# Patient Record
Sex: Male | Born: 1990
Health system: Southern US, Community
[De-identification: ages and names within clinical notes are randomized; demographics above are authoritative.]

## PROBLEM LIST (undated history)

## (undated) DIAGNOSIS — F909 Attention-deficit hyperactivity disorder, unspecified type: Secondary | ICD-10-CM

## (undated) DIAGNOSIS — F988 Other specified behavioral and emotional disorders with onset usually occurring in childhood and adolescence: Secondary | ICD-10-CM

## (undated) DIAGNOSIS — R0602 Shortness of breath: Secondary | ICD-10-CM

## (undated) DIAGNOSIS — M2550 Pain in unspecified joint: Secondary | ICD-10-CM

## (undated) DIAGNOSIS — M7989 Other specified soft tissue disorders: Secondary | ICD-10-CM

## (undated) DIAGNOSIS — G4730 Sleep apnea, unspecified: Secondary | ICD-10-CM

## (undated) DIAGNOSIS — E669 Obesity, unspecified: Secondary | ICD-10-CM

## (undated) DIAGNOSIS — M549 Dorsalgia, unspecified: Secondary | ICD-10-CM

## (undated) DIAGNOSIS — J45909 Unspecified asthma, uncomplicated: Secondary | ICD-10-CM

## (undated) HISTORY — DX: Sleep apnea, unspecified: G47.30

## (undated) HISTORY — DX: Shortness of breath: R06.02

## (undated) HISTORY — PX: ADENOIDECTOMY: SUR15

## (undated) HISTORY — PX: TONSILLECTOMY: SUR1361

## (undated) HISTORY — DX: Other specified soft tissue disorders: M79.89

## (undated) HISTORY — DX: Attention-deficit hyperactivity disorder, unspecified type: F90.9

## (undated) HISTORY — DX: Pain in unspecified joint: M25.50

## (undated) HISTORY — DX: Dorsalgia, unspecified: M54.9

## (undated) HISTORY — DX: Other specified behavioral and emotional disorders with onset usually occurring in childhood and adolescence: F98.8

---
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
## 2008-10-23 ENCOUNTER — Emergency Department (HOSPITAL_COMMUNITY): Admission: EM | Admit: 2008-10-23 | Discharge: 2008-10-23 | Payer: Self-pay | Admitting: Family Medicine

## 2009-06-13 ENCOUNTER — Emergency Department (HOSPITAL_COMMUNITY): Admission: EM | Admit: 2009-06-13 | Discharge: 2009-06-14 | Payer: Self-pay | Admitting: Emergency Medicine

## 2010-01-12 ENCOUNTER — Emergency Department (HOSPITAL_COMMUNITY): Admission: EM | Admit: 2010-01-12 | Discharge: 2010-01-12 | Payer: Self-pay | Admitting: Family Medicine
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
## 2011-09-02 ENCOUNTER — Emergency Department (HOSPITAL_COMMUNITY): Payer: Medicaid Other

## 2011-09-02 ENCOUNTER — Encounter (HOSPITAL_COMMUNITY): Payer: Self-pay | Admitting: Emergency Medicine

## 2011-09-02 ENCOUNTER — Emergency Department (HOSPITAL_COMMUNITY)
Admission: EM | Admit: 2011-09-02 | Discharge: 2011-09-03 | Disposition: A | Discharge status: Home / Self Care | Payer: Medicaid Other | Attending: Emergency Medicine | Admitting: Emergency Medicine

## 2011-09-02 DIAGNOSIS — S8012XA Contusion of left lower leg, initial encounter: Secondary | ICD-10-CM

## 2011-09-02 DIAGNOSIS — S8010XA Contusion of unspecified lower leg, initial encounter: Secondary | ICD-10-CM | POA: Insufficient documentation

## 2011-09-02 DIAGNOSIS — Y998 Other external cause status: Secondary | ICD-10-CM | POA: Insufficient documentation

## 2011-09-02 DIAGNOSIS — W108XXA Fall (on) (from) other stairs and steps, initial encounter: Secondary | ICD-10-CM | POA: Insufficient documentation

## 2011-09-02 DIAGNOSIS — Y9301 Activity, walking, marching and hiking: Secondary | ICD-10-CM | POA: Insufficient documentation

## 2011-09-02 NOTE — ED Notes (Signed)
PT. TRIPPED AND FELL THIS EVENING , PRESENTS WITH LEFT LOWER LEG PAIN WITH BRUISE / SWELLING . AMBULATORY.

## 2011-09-03 MED ORDER — OXYCODONE-ACETAMINOPHEN 5-325 MG PO TABS: 2.0000 | ORAL_TABLET | ORAL | Status: AC | PRN

## 2011-09-03 MED ORDER — OXYCODONE-ACETAMINOPHEN 5-325 MG PO TABS
2.0000 | ORAL_TABLET | Freq: Once | ORAL | Status: AC
  Administered 2011-09-03: 2 via ORAL
  Filled 2011-09-03: qty 2

## 2011-09-03 NOTE — ED Notes (Signed)
Rx x 1, pt voiced understanding to f/u with ortho in 1 week if no improvement

## 2011-09-03 NOTE — Discharge Instructions (Signed)
Return to the ER for worsening condition, or new concerning symptoms.  Specifically, return for worsening pain, tightness in the leg/calf, tingling in the lower leg and foot, or change in color of the leg.    Hematoma A hematoma is a pocket of blood that collects under the skin, in an organ, in a body space, in a joint space, or in other tissue. The blood can clot to form a lump that you can see and feel. The lump is often firm, sore, and sometimes even painful and tender. Most hematomas get better in a few days to weeks. However, some hematomas may be serious and require medical care.Hematomas can range in size from very small to very large. CAUSES  A hematoma can be caused by a blunt or penetrating injury. It can also be caused by leakage from a blood vessel under the skin. Spontaneous leakage from a blood vessel is more likely to occur in elderly people, especially those taking blood thinners. Sometimes, a hematoma can develop after certain medical procedures. SYMPTOMS  Unlike a bruise, a hematoma forms a firm lump that you can feel. This lump is the collection of blood. The collection of blood can also cause your skin to turn a blue to dark blue color. If the hematoma is close to the surface of the skin, it often produces a yellowish color in the skin. DIAGNOSIS  Your caregiver can determine whether you have a hematoma based on your history and a physical exam. TREATMENT  Hematomas usually go away on their own over time. Rarely does the blood need to be drained out of the body. HOME CARE INSTRUCTIONS   Put ice on the injured area.   Put ice in a plastic bag.   Place a towel between your skin and the bag.   Leave the ice on for 15 to 20 minutes, 3 to 4 times a day for the first 1 to 2 days.   After the first 2 days, switch to using warm compresses on the hematoma.   Elevate the injured area to help decrease pain and swelling. Wrapping the area with an elastic bandage may also be helpful.  Compression helps to reduce swelling and promotes shrinking of the hematoma. Make sure the bandage is not wrapped too tight.   If your hematoma is on a lower extremity and is painful, crutches may be helpful for a couple days.   Only take over-the-counter or prescription medicines for pain, discomfort, or fever as directed by your caregiver. Most patients can take acetaminophen or ibuprofen for the pain.  SEEK IMMEDIATE MEDICAL CARE IF:   You have increasing pain, or your pain is not controlled with medicine.   You have a fever.   You have worsening swelling or discoloration.   Your skin over the hematoma breaks or starts bleeding.  MAKE SURE YOU:   Understand these instructions.   Will watch your condition.   Will get help right away if you are not doing well or get worse.  Document Released: 10/29/2003 Document Revised: 03/05/2011 Document Reviewed: 11/17/2010 [facility] Patient Information 2012 [place], [place].

## 2011-09-03 NOTE — ED Notes (Addendum)
Pt states he went up the steps at his home, his puppy went in front of him and he tripped and hit the stairs. Pt states he took 2 aleve around 7:30pm w/o relieve.

## 2011-09-03 NOTE — ED Notes (Signed)
First contact with pt. Ice pack in place. Report received.

## 2011-09-03 NOTE — ED Provider Notes (Signed)
History     CSN: [id number]  Arrival date & time 09/02/11  2317   First MD Initiated Contact with Patient 09/03/11 0255      Chief Complaint  Patient presents with  . Fall    (Consider location/radiation/quality/duration/timing/severity/associated sxs/prior treatment) HPI 21 year old male presents emergency department after fall. Patient reports he tripped over his dog and fell up a stair striking his left leg. Patient complaining of pain from the top of his knee down to his toes. Patient noted to have area of bruising just inferior to his patella on the medial aspect of his anterior shin. Patient has had pain with ambulation is only able to bear weight slightly.  History reviewed. No pertinent past medical history.  History reviewed. No pertinent past surgical history.  No family history on file.  History  Substance Use Topics  . Smoking status: Not on file  . Smokeless tobacco: Not on file  . Alcohol Use: Not on file      Review of Systems  All other systems reviewed and are negative.  other than listed in HPI  Allergies  Review of patient's allergies indicates no known allergies.  Home Medications   Current Outpatient Rx  Name Route Sig Dispense Refill  . IBUPROFEN 200 MG PO TABS Oral Take by mouth every 6 (six) hours as needed. Leg pain    . OXYCODONE-ACETAMINOPHEN 5-325 MG PO TABS Oral Take 2 tablets by mouth every 4 (four) hours as needed for pain. 20 tablet 0    BP 120/70  Pulse 65  Temp(Src) 97.5 F (36.4 C) (Oral)  Resp 16  SpO2 98%  Physical Exam  Nursing note and vitals reviewed. Constitutional: He is oriented to person, place, and time. He appears distressed.       Obese  HENT:  Head: Normocephalic.  Eyes: Conjunctivae and EOM are normal. Pupils are equal, round, and reactive to light.  Neck: Normal range of motion. No JVD present. No tracheal deviation present.  Cardiovascular: Normal rate, regular rhythm, normal heart sounds and intact  distal pulses.  Exam reveals no gallop and no friction rub.   No murmur heard. Pulmonary/Chest: Effort normal and breath sounds normal. No stridor. No respiratory distress. He has no wheezes. He has no rales. He exhibits no tenderness.  Musculoskeletal: He exhibits tenderness. He exhibits no edema.       Ecchymosis noted to left leg just inferior to medial pole of patella. No joint effusion noted no joint line tenderness pain with range of motion but no crepitus. Compartments are soft. Neurovascularly intact. Pain with range of motion of the ankle but no effusion crepitus or other findings on physical exam  Lymphadenopathy:    He has no cervical adenopathy.  Neurological: He is alert and oriented to person, place, and time. He exhibits normal muscle tone. Coordination normal.  Skin: Skin is dry. No rash noted. No pallor.  Psychiatric: He has a normal mood and affect. His behavior is normal. Judgment and thought content normal.    ED Course  Procedures (including critical care time)  Labs Reviewed - No data to display Dg Knee Complete 4 Views Left  09/03/2011  *RADIOLOGY REPORT*  Clinical Data: Left knee pain.  LEFT KNEE - COMPLETE 4+ VIEW  Comparison: None  Findings: The joint spaces are maintained.  No acute fracture or osteochondral lesion.  No joint effusion.  IMPRESSION: No acute bony findings.  Original Report Authenticated By: P. Loralie Champagne, M.D.     1. Contusion  of left lower leg       MDM  21 year old male with fall and contusion of his left leg. No signs of compartment syndrome, x-rays negative for fracture patient able to ambulate. Will discharge home with Ace wrap for help with swelling.  Pt given precautions for return and expected outcome of his injury.        Olivia Mackie, MD 09/03/11 [phone]

## 2011-09-28 ENCOUNTER — Emergency Department (HOSPITAL_COMMUNITY): Payer: Medicaid Other

## 2011-09-28 ENCOUNTER — Emergency Department (HOSPITAL_COMMUNITY)
Admission: EM | Admit: 2011-09-28 | Discharge: 2011-09-28 | Disposition: A | Discharge status: Home / Self Care | Payer: Medicaid Other | Attending: Emergency Medicine | Admitting: Emergency Medicine

## 2011-09-28 ENCOUNTER — Encounter (HOSPITAL_COMMUNITY): Payer: Self-pay | Admitting: *Deleted

## 2011-09-28 DIAGNOSIS — S8990XA Unspecified injury of unspecified lower leg, initial encounter: Secondary | ICD-10-CM | POA: Insufficient documentation

## 2011-09-28 DIAGNOSIS — W010XXA Fall on same level from slipping, tripping and stumbling without subsequent striking against object, initial encounter: Secondary | ICD-10-CM | POA: Insufficient documentation

## 2011-09-28 DIAGNOSIS — S99919A Unspecified injury of unspecified ankle, initial encounter: Secondary | ICD-10-CM | POA: Insufficient documentation

## 2011-09-28 MED ORDER — IBUPROFEN 800 MG PO TABS: 800.0000 mg | ORAL_TABLET | Freq: Three times a day (TID) | ORAL | Status: AC

## 2011-09-28 MED ORDER — IBUPROFEN 400 MG PO TABS
800.0000 mg | ORAL_TABLET | Freq: Once | ORAL | Status: AC
  Administered 2011-09-28: 800 mg via ORAL
  Filled 2011-09-28: qty 2

## 2011-09-28 MED ORDER — OXYCODONE-ACETAMINOPHEN 5-325 MG PO TABS: 2.0000 | ORAL_TABLET | ORAL | Status: AC | PRN

## 2011-09-28 NOTE — Discharge Instructions (Signed)
Mr Junkins the x-ray of your knee did not show a fracture.  Ice and elevate x 24 hours.Use the immobilizer x several days. Use the crutches as needed for comfort.  Follow up with Dr. Eulah Pont if not better in several days.  Take ibuprofen 800mg  for pain.  Take the percocet for extreme pain but do not drive with this.

## 2011-09-28 NOTE — Progress Notes (Signed)
Orthopedic Tech Progress Note Patient Details:  Robert Stokes 1990/06/30 [id number]  Ortho Devices Type of Ortho Device: Crutches;Knee Immobilizer Ortho Device/Splint Location: left knee Ortho Device/Splint Interventions: Application   Robert Stokes 09/28/2011, 8:19 PM

## 2011-09-28 NOTE — ED Notes (Signed)
Painful lt knee for 2-3 daYS AFTER HE FELL OVER HIS DOG COMING UP STEPS AT HIS HOUSE

## 2011-09-28 NOTE — ED Provider Notes (Signed)
History     CSN: [id number]  Arrival date & time 09/28/11  1755   None     Chief Complaint  Patient presents with  . Knee Injury    (Consider location/radiation/quality/duration/timing/severity/associated sxs/prior treatment) Patient is a 21 y.o. male presenting with knee pain. The history is provided by the patient. No language interpreter was used.  Knee Pain This is a new problem. The current episode started yesterday. The problem occurs constantly. The problem has been unchanged. Pertinent negatives include no fever, nausea, numbness, vomiting or weakness. The symptoms are aggravated by walking. He has tried NSAIDs for the symptoms.  L knee pain after falling into a step after his dog tripped him.  Slight swelling noted.  Limping with ambulation.    History reviewed. No pertinent past medical history.  History reviewed. No pertinent past surgical history.  No family history on file.  History  Substance Use Topics  . Smoking status: Never Smoker   . Smokeless tobacco: Not on file  . Alcohol Use: No      Review of Systems  Constitutional: Negative.  Negative for fever.  HENT: Negative.   Eyes: Negative.   Respiratory: Negative.   Cardiovascular: Negative.   Gastrointestinal: Negative.  Negative for nausea and vomiting.  Musculoskeletal: Positive for gait problem.       L knee pain  Neurological: Negative.  Negative for weakness and numbness.  Psychiatric/Behavioral: Negative.   All other systems reviewed and are negative.    Allergies  Review of patient's allergies indicates no known allergies.  Home Medications   Current Outpatient Rx  Name Route Sig Dispense Refill  . ACETAMINOPHEN 500 MG PO TABS Oral Take 1,000 mg by mouth every 6 (six) hours as needed. For pain      BP 128/63  Pulse 80  Temp 97.9 F (36.6 C) (Oral)  Resp 20  SpO2 98%  Physical Exam  Nursing note and vitals reviewed. Constitutional: He is oriented to person, place, and time. He  appears well-developed and well-nourished.  HENT:  Head: Normocephalic.  Eyes: Conjunctivae and EOM are normal. Pupils are equal, round, and reactive to light.  Neck: Normal range of motion. Neck supple.  Cardiovascular: Normal rate.   Pulmonary/Chest: Effort normal.  Abdominal: Soft.  Musculoskeletal: Normal range of motion. He exhibits edema and tenderness.       L knee edema and tenderness  Neurological: He is alert and oriented to person, place, and time.  Skin: Skin is warm and dry.  Psychiatric: He has a normal mood and affect.    ED Course  Procedures (including critical care time)  Labs Reviewed - No data to display Dg Knee Complete 4 Views Left  09/28/2011  *RADIOLOGY REPORT*  Clinical Data: Left knee pain.  LEFT KNEE - COMPLETE 4+ VIEW  Comparison: 09/02/2011  Findings: There is no fracture, dislocation, or joint effusion.  No arthritic changes.  IMPRESSION: Normal exam.  Original Report Authenticated By: Gwynn Burly, M.D.     No diagnosis found.    MDM  L knee pain with negative films.  Immobilizer and crutches provided.  Will follow up with Dr. Eulah Pont.  Pain meds. RICE.  + CMS below injury.        Remi Haggard, NP 09/29/11 1732

## 2011-09-30 NOTE — ED Provider Notes (Signed)
Medical screening examination/treatment/procedure(s) were performed by non-physician practitioner and as supervising physician I was immediately available for consultation/collaboration.  Konner Saiz R. Deshannon Seide, MD 09/30/11 0010 

## 2012-05-04 ENCOUNTER — Emergency Department (HOSPITAL_COMMUNITY)
Admission: EM | Admit: 2012-05-04 | Discharge: 2012-05-04 | Disposition: A | Discharge status: Home / Self Care | Payer: Medicaid Other | Attending: Emergency Medicine | Admitting: Emergency Medicine

## 2012-05-04 ENCOUNTER — Encounter (HOSPITAL_COMMUNITY): Payer: Self-pay | Admitting: Emergency Medicine

## 2012-05-04 DIAGNOSIS — M25519 Pain in unspecified shoulder: Secondary | ICD-10-CM | POA: Insufficient documentation

## 2012-05-04 DIAGNOSIS — J45909 Unspecified asthma, uncomplicated: Secondary | ICD-10-CM | POA: Insufficient documentation

## 2012-05-04 DIAGNOSIS — M25511 Pain in right shoulder: Secondary | ICD-10-CM

## 2012-05-04 HISTORY — DX: Unspecified asthma, uncomplicated: J45.909

## 2012-05-04 MED ORDER — NAPROXEN 375 MG PO TABS: 375.0000 mg | ORAL_TABLET | Freq: Two times a day (BID) | ORAL | Status: DC

## 2012-05-04 MED ORDER — METHOCARBAMOL 500 MG PO TABS: 500.0000 mg | ORAL_TABLET | Freq: Two times a day (BID) | ORAL | Status: DC

## 2012-05-04 NOTE — ED Provider Notes (Signed)
Medical screening examination/treatment/procedure(s) were performed by non-physician practitioner and as supervising physician I was immediately available for consultation/collaboration.   Carleene Cooper III, MD 05/04/12 2016

## 2012-05-04 NOTE — ED Provider Notes (Signed)
History     CSN: [id number]  Arrival date & time 05/04/12  1004   First MD Initiated Contact with Patient 05/04/12 1031      Chief Complaint  Patient presents with  . Arm Pain    (Consider location/radiation/quality/duration/timing/severity/associated sxs/prior treatment) HPI  22 year old male presents complaining of right shoulder pain. Patient reports for the past 2-3 days he has developed an acute onset of pain to his right shoulder.  Pain is moderate in intensity.  Describe pain as an aching shooting sensation from his elbow to his shoulder and back. Pain worsening with movement. Her heard a "click or pop" if he moves a certain direction.  He has tried using ice without any relief. Patient denies any recent trauma however report that in 2009 he was playing football and was hit in the right shoulder with a helmet. However that pain has resolved after a week. Patient reports he has been able to perform normal daily activities including playing basketball up until 3 days ago. Patient denies fever, rash, chest pain, shortness of breath, numbness, weakness.  Past Medical History  Diagnosis Date  . Asthma     History reviewed. No pertinent past surgical history.  No family history on file.  History  Substance Use Topics  . Smoking status: Never Smoker   . Smokeless tobacco: Not on file  . Alcohol Use: No      Review of Systems  Constitutional: Negative for fever and chills.  Respiratory: Negative for chest tightness and shortness of breath.   Cardiovascular: Negative for chest pain.  Skin: Negative for rash and wound.  Neurological: Negative for numbness.    Allergies  Review of patient's allergies indicates no known allergies.  Home Medications  No current outpatient prescriptions on file.  BP 131/83  Pulse 81  Temp 98 F (36.7 C) (Oral)  Resp 18  SpO2 97%  Physical Exam  Nursing note and vitals reviewed. Constitutional: He is oriented to person, place, and  time. He appears well-developed and well-nourished.       Moderately obese  HENT:  Head: Atraumatic.  Eyes: Conjunctivae normal are normal.  Neck: Normal range of motion. Neck supple.  Musculoskeletal: He exhibits tenderness (R shoulder: tenderness noted to R trapezius muscle and around [facility] Joint on palpation with decreased active ROM.  negative Spurling maneuver. Normal Hawkins test.  No rash, no swelling.  normal grip strength, radial pulse 2+.  brisk cap refill). He exhibits no edema.       Right shoulder: He exhibits decreased range of motion, tenderness and pain. He exhibits no bony tenderness, no swelling, no effusion, no crepitus, no deformity, normal pulse and normal strength.       Right elbow: Normal.      Right wrist: Normal.       Cervical back: Normal.  Neurological: He is alert and oriented to person, place, and time.  Skin: Skin is warm. No rash noted.  Psychiatric: He has a normal mood and affect.    ED Course  Procedures (including critical care time)  Labs Reviewed - No data to display No results found.   No diagnosis found.  11:15 AM The patient was seen and evaluated by me. Patient complaining of right shoulder pain without any acute trauma. Shoulder does not appears to be infected. Doubt septic arthritis. No deformity or dislocation noted due to non-trauma will not x-ray. Patient is neurovascularly intact. Pain may suggest possible labral tear or muscle strain.  Plan to give  patient NSAIDs, muscle relaxants, and orthopedic referral. No cervical radiculopathy concerning for radicular pain at this time  11:16 AM BP 131/83  Pulse 81  Temp 98 F (36.7 C) (Oral)  Resp 18  SpO2 97%  I have reviewed nursing notes and vital signs.   I reviewed available ER/hospitalization records thought the EMR  1. R shoulder pain   MDM          Fayrene Helper, PA-C 05/04/12 1117

## 2012-05-04 NOTE — ED Notes (Signed)
Pt c/o right shoulder pain radiating down to elbow with movement. A&Ox4, ambulatory, nad.

## 2012-08-02 ENCOUNTER — Emergency Department (HOSPITAL_COMMUNITY)
Admission: EM | Admit: 2012-08-02 | Discharge: 2012-08-02 | Disposition: A | Discharge status: Home / Self Care | Payer: Medicare Other | Attending: Emergency Medicine | Admitting: Emergency Medicine

## 2012-08-02 ENCOUNTER — Emergency Department (HOSPITAL_COMMUNITY): Payer: Medicare Other

## 2012-08-02 ENCOUNTER — Encounter (HOSPITAL_COMMUNITY): Payer: Self-pay | Admitting: Emergency Medicine

## 2012-08-02 DIAGNOSIS — S93409A Sprain of unspecified ligament of unspecified ankle, initial encounter: Secondary | ICD-10-CM | POA: Diagnosis not present

## 2012-08-02 DIAGNOSIS — S99919A Unspecified injury of unspecified ankle, initial encounter: Secondary | ICD-10-CM | POA: Insufficient documentation

## 2012-08-02 DIAGNOSIS — J45909 Unspecified asthma, uncomplicated: Secondary | ICD-10-CM | POA: Insufficient documentation

## 2012-08-02 DIAGNOSIS — M79604 Pain in right leg: Secondary | ICD-10-CM

## 2012-08-02 DIAGNOSIS — Y9367 Activity, basketball: Secondary | ICD-10-CM | POA: Insufficient documentation

## 2012-08-02 DIAGNOSIS — S8990XA Unspecified injury of unspecified lower leg, initial encounter: Secondary | ICD-10-CM | POA: Diagnosis not present

## 2012-08-02 DIAGNOSIS — Y929 Unspecified place or not applicable: Secondary | ICD-10-CM | POA: Insufficient documentation

## 2012-08-02 DIAGNOSIS — W1809XA Striking against other object with subsequent fall, initial encounter: Secondary | ICD-10-CM | POA: Insufficient documentation

## 2012-08-02 MED ORDER — TRAMADOL HCL 50 MG PO TABS: 50.0000 mg | ORAL_TABLET | Freq: Four times a day (QID) | ORAL | Status: DC | PRN

## 2012-08-02 NOTE — ED Notes (Signed)
Pt c/o bilateral leg pain after hitting on grocery cart today

## 2012-08-02 NOTE — Progress Notes (Signed)
Orthopedic Tech Progress Note Patient Details:  Robert Stokes 06-Feb-1991 [id number]  Ortho Devices Type of Ortho Device: Crutches Ortho Device/Splint Interventions: Ordered;Adjustment   Jennye Moccasin 08/02/2012, 4:17 PM

## 2012-08-02 NOTE — ED Provider Notes (Signed)
History     CSN: [id number]  Arrival date & time 08/02/12  1419   First MD Initiated Contact with Patient 08/02/12 1434      Chief Complaint  Patient presents with  . Leg Pain    (Consider location/radiation/quality/duration/timing/severity/associated sxs/prior treatment) HPI Comments: Patient reports that approximately one hour prior to arrival he while playing basketball he hit his legs on the bottom area of a grocery cart.  He then fell to the ground and twisted his left ankle when he fell.  He reports that she has been having pain of the left ankle and the bilateral lower legs since that time.  He has not taken anything for pain prior to arrival.  He reports that he was able to walk after the injury, but had significant pain with ambulation.  He has also noticed some bruising and swelling of the anterior lower legs.  He denies numbness or tingling.  No laceration.  The history is provided by the patient.    Past Medical History  Diagnosis Date  . Asthma     History reviewed. No pertinent past surgical history.  History reviewed. No pertinent family history.  History  Substance Use Topics  . Smoking status: Never Smoker   . Smokeless tobacco: Not on file  . Alcohol Use: No      Review of Systems  Musculoskeletal:       Bilateral lower leg pain Left ankle pain    Allergies  Review of patient's allergies indicates no known allergies.  Home Medications  No current outpatient prescriptions on file.  BP 160/85  Pulse 81  Temp(Src) 98.3 F (36.8 C) (Oral)  Resp 18  SpO2 98%  Physical Exam  Nursing note and vitals reviewed. Constitutional: He appears well-developed and well-nourished.  HENT:  Head: Normocephalic and atraumatic.  Neck: Normal range of motion. Neck supple.  Cardiovascular: Normal rate, regular rhythm and normal heart sounds.   Pulses:      Dorsalis pedis pulses are 2+ on the right side, and 2+ on the left side.  Pulmonary/Chest: Effort  normal and breath sounds normal.  Musculoskeletal:       Right knee: He exhibits normal range of motion, no swelling, no deformity and no erythema. No tenderness found.       Left knee: He exhibits normal range of motion, no swelling, no deformity and no erythema. No tenderness found.       Left ankle: He exhibits swelling. He exhibits no ecchymosis and no deformity. Tenderness. Lateral malleolus tenderness found.  Bruising and mild swelling over the anterior lower legs bilaterally   Neurological: He is alert. No sensory deficit.  Skin: Skin is warm, dry and intact. No laceration noted.    ED Course  Procedures (including critical care time)  Labs Reviewed - No data to display Dg Tibia/fibula Left  08/02/2012  *RADIOLOGY REPORT*  Clinical Data: Bilateral lower leg pain  LEFT TIBIA AND FIBULA - 2 VIEW  Comparison: Left knee radiographs 09/28/2011.  Findings: The knee and ankle joints are located.  No acute bone or soft tissue abnormalities are present.  IMPRESSION: Negative tibia and fibula.   Original Report Authenticated By: Marin Roberts, M.D.    Dg Tibia/fibula Right  08/02/2012  *RADIOLOGY REPORT*  Clinical Data: Lower leg pain  RIGHT TIBIA AND FIBULA - 2 VIEW  Comparison: 08/09/2006, 08/14/2006  Findings: Right tibia and fibula are intact.  Normal alignment.  No fracture.  No soft tissue abnormality.  IMPRESSION: No acute  finding.   Original Report Authenticated By: Judie Petit. Shick, M.D.    Dg Ankle Complete Left  08/02/2012  *RADIOLOGY REPORT*  Clinical Data: Bilateral lower leg pain.  Trauma.  LEFT ANKLE COMPLETE - 3+ VIEW  Comparison: None.  Findings:  No fracture dislocation.  Soft tissue prominence.  Impression: No fracture.   Original Report Authenticated By: Lacy Duverney, M.D.      No diagnosis found.    MDM  Patient presenting with bilateral lower leg pain and left ankle pain after injuring himself playing basketball just prior to arrival.  Xrays negative.  Patient  neurovascularly intact.  Patient requesting crutches so that he does not have to put weight on left ankle.  Ankle ASO also given.  Patient stable for discharge.  Return precautions given.        Pascal Lux [place], PA-C 08/03/12 [id number]

## 2012-08-04 NOTE — ED Provider Notes (Signed)
Medical screening examination/treatment/procedure(s) were performed by non-physician practitioner and as supervising physician I was immediately available for consultation/collaboration.   Loren Racer, MD 08/04/12 1530

## 2013-11-03 ENCOUNTER — Emergency Department (HOSPITAL_COMMUNITY): Payer: Medicare Other

## 2013-11-03 ENCOUNTER — Emergency Department (HOSPITAL_COMMUNITY)
Admission: EM | Admit: 2013-11-03 | Discharge: 2013-11-03 | Disposition: A | Discharge status: Home / Self Care | Payer: Medicare Other | Attending: Emergency Medicine | Admitting: Emergency Medicine

## 2013-11-03 ENCOUNTER — Encounter (HOSPITAL_COMMUNITY): Payer: Self-pay | Admitting: Emergency Medicine

## 2013-11-03 DIAGNOSIS — Y9239 Other specified sports and athletic area as the place of occurrence of the external cause: Secondary | ICD-10-CM | POA: Diagnosis not present

## 2013-11-03 DIAGNOSIS — S8990XA Unspecified injury of unspecified lower leg, initial encounter: Secondary | ICD-10-CM | POA: Insufficient documentation

## 2013-11-03 DIAGNOSIS — J45909 Unspecified asthma, uncomplicated: Secondary | ICD-10-CM | POA: Diagnosis not present

## 2013-11-03 DIAGNOSIS — R296 Repeated falls: Secondary | ICD-10-CM | POA: Insufficient documentation

## 2013-11-03 DIAGNOSIS — Y9367 Activity, basketball: Secondary | ICD-10-CM | POA: Insufficient documentation

## 2013-11-03 DIAGNOSIS — Y92838 Other recreation area as the place of occurrence of the external cause: Secondary | ICD-10-CM | POA: Diagnosis not present

## 2013-11-03 DIAGNOSIS — S99919A Unspecified injury of unspecified ankle, initial encounter: Secondary | ICD-10-CM | POA: Diagnosis not present

## 2013-11-03 DIAGNOSIS — M25569 Pain in unspecified knee: Secondary | ICD-10-CM | POA: Diagnosis not present

## 2013-11-03 DIAGNOSIS — X500XXA Overexertion from strenuous movement or load, initial encounter: Secondary | ICD-10-CM | POA: Diagnosis not present

## 2013-11-03 DIAGNOSIS — S99929A Unspecified injury of unspecified foot, initial encounter: Principal | ICD-10-CM

## 2013-11-03 DIAGNOSIS — M25562 Pain in left knee: Secondary | ICD-10-CM

## 2013-11-03 DIAGNOSIS — M79609 Pain in unspecified limb: Secondary | ICD-10-CM | POA: Diagnosis not present

## 2013-11-03 DIAGNOSIS — IMO0002 Reserved for concepts with insufficient information to code with codable children: Secondary | ICD-10-CM | POA: Diagnosis not present

## 2013-11-03 MED ORDER — IBUPROFEN 800 MG PO TABS: 800.0000 mg | ORAL_TABLET | Freq: Three times a day (TID) | ORAL | Status: DC | PRN

## 2013-11-03 MED ORDER — HYDROCODONE-ACETAMINOPHEN 5-325 MG PO TABS: 1.0000 | ORAL_TABLET | ORAL | Status: DC | PRN

## 2013-11-03 MED ORDER — HYDROCODONE-ACETAMINOPHEN 5-325 MG PO TABS
2.0000 | ORAL_TABLET | Freq: Once | ORAL | Status: AC
  Administered 2013-11-03: 2 via ORAL
  Filled 2013-11-03: qty 2

## 2013-11-03 NOTE — Progress Notes (Signed)
Orthopedic Tech Progress Note Patient Details:  Royetta CarJoseph D Mcadory 08/31/1990 [id number]  Ortho Devices Type of Ortho Device: Crutches;Knee Immobilizer Ortho Device/Splint Location: lle Ortho Device/Splint Interventions: Application   Elizet Kaplan 11/03/2013, 10:08 PM

## 2013-11-03 NOTE — ED Provider Notes (Signed)
CSN: [id number]     Arrival date & time 11/03/13  [id number] History  This chart was scribed for Trixie DredgeEmily Tylena Prisk, [place] working with Richardean Canalavid H Yao, MD by Quintella ReichertMatthew Underwood, ED Scribe. This patient was seen in room TR11C/TR11C and the patient's care was started at 7:26 PM.   Chief Complaint  Patient presents with  . Leg Injury     The history is provided by the patient. No language interpreter was used.    HPI Comments: Robert Stokes is a 23 y.o. male who presents to the Emergency Department complaining of a left leg injury sustained last night.  Pt states he was playing basketball when he rolled his left ankle and fell, and another player who weighs 300 lb fell on top of him.  He landed with his left leg underneath him and "heard a snap and crunch."  He developed immediate onset of severe 10/10 pain to the left knee that radiates down left lower leg.  Pain is greatly worsened by bending the knee and bearing weight.  He states he is only able to walk by putting all of his weight on his toes.  He also reports mild pain to the left ankle "from where I rolled it."  Pt denies prior h/o knee injury or fractures. Denies weakness or numbness.  Denies hitting his head or LOC.    Past Medical History  Diagnosis Date  . Asthma     History reviewed. No pertinent past surgical history.  History reviewed. No pertinent family history.   History  Substance Use Topics  . Smoking status: Never Smoker   . Smokeless tobacco: Not on file  . Alcohol Use: No     Review of Systems  Constitutional: Negative for fever.  Musculoskeletal: Positive for arthralgias (left knee). Negative for back pain.  Skin: Negative for color change and wound.  Allergic/Immunologic: Negative for immunocompromised state.  Neurological: Negative for syncope, weakness, numbness and headaches.  Hematological: Does not bruise/bleed easily.      Allergies  Review of patient's allergies indicates no known allergies.  Home Medications    Prior to Admission medications   Medication Sig Start Date End Date Taking? Authorizing Provider  traMADol (ULTRAM) 50 MG tablet Take 1 tablet (50 mg total) by mouth every 6 (six) hours as needed for pain. 08/02/12   Heather Laisure, PA-C   BP 138/66  Pulse 86  Temp(Src) 97.6 F (36.4 C) (Oral)  Resp 18  SpO2 98%  Physical Exam  Nursing note and vitals reviewed. Constitutional: He appears well-developed and well-nourished. No distress.  HENT:  Head: Normocephalic and atraumatic.  Neck: Neck supple.  Pulmonary/Chest: Effort normal.  Musculoskeletal:       Left hip: Normal.       Left knee: He exhibits decreased range of motion. He exhibits no effusion, no ecchymosis, no deformity, no laceration, normal alignment, no LCL laxity and no MCL laxity. Tenderness found.       Left ankle: Normal.       Left foot: Normal.  Decreased flexion of right knee secondary to pain.  Tender diffusely over the lateral, medial and inferior aspect of the knee and down into the tibia.  Most significant tenderness over the tibial plateau (per pt).  No specific focal tenderness.  No laxity on exam.  Pain with stress in all directions. Wiggles all toes.  Calf nontender, no edema, compartments soft.  Thigh nontender, no edema, compartments soft.  Distal sensation and pulses intact. NO skin changes.  No ecchymosis.  No erythema, edema, or warmth.   Neurological: He is alert.  Skin: He is not diaphoretic.    ED Course  Procedures (including critical care time)  DIAGNOSTIC STUDIES: Oxygen Saturation is 98% on room air, normal by my interpretation.    COORDINATION OF CARE: 7:29 PM-Discussed treatment plan which includes x-ray with pt at bedside and pt agreed to plan.   8:47 PM-On recheck after receiving 2 tablets of 5-325mg  Vicodin pt continues to have significant pain.  He states his pain has improved from 10/10 to 8/10 currently.  Discussed treatment plan which includes CT-scan of knee due to his continued  severe pain with pt at bedside and pt agreed to plan.     Labs Review Labs Reviewed - No data to display  Imaging Review Dg Tibia/fibula Left  11/03/2013   CLINICAL DATA:  Left lower leg injury and pain.  EXAM: LEFT TIBIA AND FIBULA - 2 VIEW  COMPARISON:  08/02/2012  FINDINGS: There is no evidence of fracture or other focal bone lesions. Soft tissues are unremarkable.  IMPRESSION: Negative.   Electronically Signed   By: Laveda Abbe M.D.   On: 11/03/2013 19:59   Ct Knee Left Wo Contrast  11/03/2013   CLINICAL DATA:  Twist and fall with large person falling on top of twisted knee, diffuse pain, most severe over the tibial plateau  EXAM: CT OF THE LEFT KNEE WITHOUT CONTRAST  TECHNIQUE: Multidetector CT imaging of the LEFT knee was performed according to the standard protocol. Multiplanar CT image reconstructions were also generated.  COMPARISON:  11/03/2013 radiographs  FINDINGS: No fracture identified. Patellar and quadriceps tendons appear intact by CT. Cruciate and collateral ligaments appear intact by CT. No joint effusion. No significant soft tissue abnormality.  IMPRESSION: Negative CT of the knee. If indicated, consider MRI to evaluate with greater sensitivity for internal derangement, as well as for the possibility of osseous contusion, which would not be seen by CT.   Electronically Signed   By: Esperanza Heir M.D.   On: 11/03/2013 21:32   Dg Knee Complete 4 Views Left  11/03/2013   CLINICAL DATA:  Left knee injury and pain.  EXAM: LEFT KNEE - COMPLETE 4+ VIEW  COMPARISON:  None.  FINDINGS: There is no evidence of fracture, dislocation, or joint effusion. There is no evidence of arthropathy or other focal bone abnormality. Soft tissues are unremarkable.  IMPRESSION: Negative.   Electronically Signed   By: Laveda Abbe M.D.   On: 11/03/2013 19:56     EKG Interpretation None      MDM   Final diagnoses:  Left knee pain    Afebrile, nontoxic patient with left knee pain after fall with someone  else falling on top of his while playing basketball.  Pt with diffuse tenderness of anterior knee.  Xrays and CT negative.  Neurovascularly intact.  Joint is stable.  D/C home with knee immobilizer, crutches, norco, orthopedic follow up.  Discussed result, findings, treatment, and follow up  with patient.  Pt given return precautions.  Pt verbalizes understanding and agrees with plan.         I personally performed the services described in this documentation, which was scribed in my presence. The recorded information has been reviewed and is accurate.   Camp Swift, PA-C 11/03/13 2244

## 2013-11-03 NOTE — ED Provider Notes (Signed)
Medical screening examination/treatment/procedure(s) were performed by non-physician practitioner and as supervising physician I was immediately available for consultation/collaboration.   EKG Interpretation None        Richardean Canalavid H Cassiopeia Florentino, MD 11/03/13 [phone]

## 2013-11-03 NOTE — ED Notes (Signed)
The pt returned from c-t 

## 2013-11-03 NOTE — ED Notes (Signed)
Presents with left leg pain began last night after getting knocked down while playing basketball. C/o left knee pain that radiaties down into left ankle. Pain is described as shooting and worse with movement and weight bearing, cms intact.

## 2013-11-03 NOTE — ED Notes (Signed)
The pt has had pain in his lt tib fib upper since last pm when he was injured playing basketball.

## 2013-11-03 NOTE — ED Notes (Signed)
Pt to ct 

## 2013-11-03 NOTE — ED Notes (Signed)
The pt returned from xray painb med given

## 2013-11-03 NOTE — ED Notes (Signed)
No needs.  pts pain is better.  Waiting for  c-t pick-up

## 2013-11-03 NOTE — Discharge Instructions (Signed)
Read the information below.  Use the prescribed medication as directed.  Please discuss all new medications with your pharmacist.  Do not take additional tylenol while taking the prescribed pain medication to avoid overdose.  You may return to the Emergency Department at any time for worsening condition or any new symptoms that concern you.  If you develop uncontrolled pain, weakness or numbness of the extremity, severe discoloration of the skin, or you are unable to walk or move your leg, return to the ER for a recheck.       Knee Pain The knee is the complex joint between your thigh and your lower leg. It is made up of bones, tendons, ligaments, and cartilage. The bones that make up the knee are:  The femur in the thigh.  The tibia and fibula in the lower leg.  The patella or kneecap riding in the groove on the lower femur. CAUSES  Knee pain is a common complaint with many causes. A few of these causes are:  Injury, such as:  A ruptured ligament or tendon injury.  Torn cartilage.  Medical conditions, such as:  Gout  Arthritis  Infections  Overuse, over training, or overdoing a physical activity. Knee pain can be minor or severe. Knee pain can accompany debilitating injury. Minor knee problems often respond well to self-care measures or get well on their own. More serious injuries may need medical intervention or even surgery. SYMPTOMS The knee is complex. Symptoms of knee problems can vary widely. Some of the problems are:  Pain with movement and weight bearing.  Swelling and tenderness.  Buckling of the knee.  Inability to straighten or extend your knee.  Your knee locks and you cannot straighten it.  Warmth and redness with pain and fever.  Deformity or dislocation of the kneecap. DIAGNOSIS  Determining what is wrong may be very straight forward such as when there is an injury. It can also be challenging because of the complexity of the knee. Tests to make a  diagnosis may include:  Your caregiver taking a history and doing a physical exam.  Routine X-rays can be used to rule out other problems. X-rays will not reveal a cartilage tear. Some injuries of the knee can be diagnosed by:  Arthroscopy a surgical technique by which a small video camera is inserted through tiny incisions on the sides of the knee. This procedure is used to examine and repair internal knee joint problems. Tiny instruments can be used during arthroscopy to repair the torn knee cartilage (meniscus).  Arthrography is a radiology technique. A contrast liquid is directly injected into the knee joint. Internal structures of the knee joint then become visible on X-ray film.  An MRI scan is a non X-ray radiology procedure in which magnetic fields and a computer produce two- or three-dimensional images of the inside of the knee. Cartilage tears are often visible using an MRI scanner. MRI scans have largely replaced arthrography in diagnosing cartilage tears of the knee.  Blood work.  Examination of the fluid that helps to lubricate the knee joint (synovial fluid). This is done by taking a sample out using a needle and a syringe. TREATMENT The treatment of knee problems depends on the cause. Some of these treatments are:  Depending on the injury, proper casting, splinting, surgery, or physical therapy care will be needed.  Give yourself adequate recovery time. Do not overuse your joints. If you begin to get sore during workout routines, back off. Slow down or  do fewer repetitions.  For repetitive activities such as cycling or running, maintain your strength and nutrition.  Alternate muscle groups. For example, if you are a weight lifter, work the upper body on one day and the lower body the next.  Either tight or weak muscles do not give the proper support for your knee. Tight or weak muscles do not absorb the stress placed on the knee joint. Keep the muscles surrounding the knee  strong.  Take care of mechanical problems.  If you have flat feet, orthotics or special shoes may help. See your caregiver if you need help.  Arch supports, sometimes with wedges on the inner or outer aspect of the heel, can help. These can shift pressure away from the side of the knee most bothered by osteoarthritis.  A brace called an "unloader" brace also may be used to help ease the pressure on the most arthritic side of the knee.  If your caregiver has prescribed crutches, braces, wraps or ice, use as directed. The acronym for this is PRICE. This means protection, rest, ice, compression, and elevation.  Nonsteroidal anti-inflammatory drugs (NSAIDs), can help relieve pain. But if taken immediately after an injury, they may actually increase swelling. Take NSAIDs with food in your stomach. Stop them if you develop stomach problems. Do not take these if you have a history of ulcers, stomach pain, or bleeding from the bowel. Do not take without your caregiver's approval if you have problems with fluid retention, heart failure, or kidney problems.  For ongoing knee problems, physical therapy may be helpful.  Glucosamine and chondroitin are over-the-counter dietary supplements. Both may help relieve the pain of osteoarthritis in the knee. These medicines are different from the usual anti-inflammatory drugs. Glucosamine may decrease the rate of cartilage destruction.  Injections of a corticosteroid drug into your knee joint may help reduce the symptoms of an arthritis flare-up. They may provide pain relief that lasts a few months. You may have to wait a few months between injections. The injections do have a small increased risk of infection, water retention, and elevated blood sugar levels.  Hyaluronic acid injected into damaged joints may ease pain and provide lubrication. These injections may work by reducing inflammation. A series of shots may give relief for as long as 6 months.  Topical  painkillers. Applying certain ointments to your skin may help relieve the pain and stiffness of osteoarthritis. Ask your pharmacist for suggestions. Many over the-counter products are approved for temporary relief of arthritis pain.  In some countries, doctors often prescribe topical NSAIDs for relief of chronic conditions such as arthritis and tendinitis. A review of treatment with NSAID creams found that they worked as well as oral medications but without the serious side effects. PREVENTION  Maintain a healthy weight. Extra pounds put more strain on your joints.  Get strong, stay limber. Weak muscles are a common cause of knee injuries. Stretching is important. Include flexibility exercises in your workouts.  Be smart about exercise. If you have osteoarthritis, chronic knee pain or recurring injuries, you may need to change the way you exercise. This does not mean you have to stop being active. If your knees ache after jogging or playing basketball, consider switching to swimming, water aerobics, or other low-impact activities, at least for a few days a week. Sometimes limiting high-impact activities will provide relief.  Make sure your shoes fit well. Choose footwear that is right for your sport.  Protect your knees. Use the proper gear  for knee-sensitive activities. Use kneepads when playing volleyball or laying carpet. Buckle your seat belt every time you drive. Most shattered kneecaps occur in car accidents.  Rest when you are tired. SEEK MEDICAL CARE IF:  You have knee pain that is continual and does not seem to be getting better.  SEEK IMMEDIATE MEDICAL CARE IF:  Your knee joint feels hot to the touch and you have a high fever. MAKE SURE YOU:   Understand these instructions.  Will watch your condition.  Will get help right away if you are not doing well or get worse. Document Released: 01/11/2007 Document Revised: 06/08/2011 Document Reviewed: 01/11/2007 [facility] Patient  Information 2015 [place], [place]. This information is not intended to replace advice given to you by your health care provider. Make sure you discuss any questions you have with your health care provider.  Knee Bracing Knee braces are supports to help stabilize and protect an injured or painful knee. They come in many different styles. They should support and protect the knee without increasing the chance of other injuries to yourself or others. It is important not to have a false sense of security when using a brace. Knee braces that help you to keep using your knee:  Do not restore normal knee stability under high stress forces.  May decrease some aspects of athletic performance. Some of the different types of knee braces are:  Prophylactic knee braces are designed to prevent or reduce the severity of knee injuries during sports that make injury to the knee more likely.  Rehabilitative knee braces are designed to allow protected motion of:  Injured knees.  Knees that have been treated with or without surgery. There is no evidence that the use of a supportive knee brace protects the graft following a successful anterior cruciate ligament (ACL) reconstruction. However, braces are sometimes used to:   Protect injured ligaments.  Control knee movement during the initial healing period. They may be used as part of the treatment program for the various injured ligaments or cartilage of the knee including the:  Anterior cruciate ligament.  Medial collateral ligament.  Medial or lateral cartilage (meniscus).  Posterior cruciate ligament.  Lateral collateral ligament. Rehabilitative knee braces are most commonly used:  During crutch-assisted walking right after injury.  During crutch-assisted walking right after surgery to repair the cartilage and/or cruciate ligament injury.  For a short period of time, 2-8 weeks, after the injury or surgery. The value of a rehabilitative brace as  opposed to a cast or splint includes the:  Ability to adjust the brace for swelling.  Ability to remove the brace for examinations, icing, or showering.  Ability to allow for movement in a controlled range of motion. Functional knee braces give support to knees that have already been injured. They are designed to provide stability for the injured knee and provide protection after repair. Functional knee braces may not affect performance much. Lower extremity muscle strengthening, flexibility, and improvement in technique are more important than bracing in treating ligamentous knee injuries. Functional braces are not a substitute for rehabilitation or surgical procedures. Unloader/off-loader braces are designed to provide pain relief in arthritic knees. Patients with wear and tear arthritis from growing old or from an old cartilage injury (osteoarthritis) of the knee, and bowlegged (varus) or knock-knee (valgus) deformities, often develop increased pain in the arthritic side due to increased loading. Unloader/off-loader braces are made to reduce uneven loading in such knees. There is reduction in bowing out movement in bowlegged  knees when the correct unloader brace is used. Patients with advanced osteoarthritis or severe varus or valgus alignment problems would not likely benefit from bracing. Patellofemoral braces help the kneecap to move smoothly and well centered over the end of the femur in the knee.  Most people who wear knee braces feel that they help. However, there is a lack of scientific evidence that knee braces are helpful at the level needed for athletic participation to prevent injury. In spite of this, athletes report an increase in knee stability, pain relief, performance improvement, and confidence during athletics when using a brace.  Different knee problems require different knee braces:  Your caregiver may suggest one kind of knee brace after knee surgery.  A caregiver may choose  another kind of knee brace for support instead of surgery for some types of torn ligaments.  You may also need one for pain in the front of your knee that is not getting better with strengthening and flexibility exercises. Get your caregiver's advice if you want to try a knee brace. The caregiver will advise you on where to get them and provide a prescription when it is needed to fashion and/or fit the brace. Knee braces are the least important part of preventing knee injuries or getting better following injury. Stretching, strengthening and technique improvement are far more important in caring for and preventing knee injuries. When strengthening your knee, increase your activities a little at a time so as not to develop injuries from overuse. Work out an exercise plan with your caregiver and/or physical therapist to get the best program for you. Do not let a knee brace become a crutch. Always remember, there are no braces which support the knee as well as your original ligaments and cartilage you were born with. Conditioning, proper warm-up, and stretching remain the most important parts of keeping your knees healthy. HOW TO USE A KNEE BRACE  During sports, knee braces should be used as directed by your caregiver.  Make sure that the hinges are where the knee bends.  Straps, tapes, or hook-and-loop tapes should be fastened around your leg as instructed.  You should check the placement of the brace during activities to make sure that it has not moved. Poorly positioned braces can hurt rather than help you.  To work well, a knee brace should be worn during all activities that put you at risk of knee injury.  Warm up properly before beginning athletic activities. HOME CARE INSTRUCTIONS  Knee braces often get damaged during normal use. Replace worn-out braces for maximum benefit.  Clean regularly with soap and water.  Inspect your brace often for wear and tear.  Cover exposed metal to  protect others from injury.  Durable materials may cost more, but last longer. SEEK IMMEDIATE MEDICAL CARE IF:   Your knee seems to be getting worse rather than better.  You have increasing pain or swelling in the knee.  You have problems caused by the knee brace.  You have increased swelling or inflammation (redness or soreness) in your knee.  Your knee becomes warm and more painful and you develop an unexplained temperature over 101F (38.3C). MAKE SURE YOU:   Understand these instructions.  Will watch your condition.  Will get help right away if you are not doing well or get worse. See your caregiver, physical therapist, or orthopedic surgeon for additional information. Document Released: 06/06/2003 Document Revised: 07/31/2013 Document Reviewed: 09/12/2008 [facility] Patient Information 2015 [place]. This information is not intended  to replace advice given to you by your health care provider. Make sure you discuss any questions you have with your health care provider. ° °

## 2013-11-07 DIAGNOSIS — M25569 Pain in unspecified knee: Secondary | ICD-10-CM | POA: Diagnosis not present

## 2013-11-12 DIAGNOSIS — M1710 Unilateral primary osteoarthritis, unspecified knee: Secondary | ICD-10-CM | POA: Diagnosis not present

## 2013-11-21 DIAGNOSIS — M25579 Pain in unspecified ankle and joints of unspecified foot: Secondary | ICD-10-CM | POA: Diagnosis not present

## 2013-12-19 DIAGNOSIS — M25579 Pain in unspecified ankle and joints of unspecified foot: Secondary | ICD-10-CM | POA: Diagnosis not present

## 2014-02-13 ENCOUNTER — Emergency Department (HOSPITAL_COMMUNITY)
Admission: EM | Admit: 2014-02-13 | Discharge: 2014-02-13 | Disposition: A | Discharge status: Home / Self Care | Payer: Medicare Other | Attending: Emergency Medicine | Admitting: Emergency Medicine

## 2014-02-13 ENCOUNTER — Encounter (HOSPITAL_COMMUNITY): Payer: Self-pay | Admitting: *Deleted

## 2014-02-13 ENCOUNTER — Emergency Department (HOSPITAL_COMMUNITY): Payer: Medicare Other

## 2014-02-13 DIAGNOSIS — Y998 Other external cause status: Secondary | ICD-10-CM | POA: Diagnosis not present

## 2014-02-13 DIAGNOSIS — J45909 Unspecified asthma, uncomplicated: Secondary | ICD-10-CM | POA: Insufficient documentation

## 2014-02-13 DIAGNOSIS — Y9389 Activity, other specified: Secondary | ICD-10-CM | POA: Insufficient documentation

## 2014-02-13 DIAGNOSIS — Y9222 Religious institution as the place of occurrence of the external cause: Secondary | ICD-10-CM | POA: Diagnosis not present

## 2014-02-13 DIAGNOSIS — Z79899 Other long term (current) drug therapy: Secondary | ICD-10-CM | POA: Insufficient documentation

## 2014-02-13 DIAGNOSIS — X58XXXA Exposure to other specified factors, initial encounter: Secondary | ICD-10-CM | POA: Insufficient documentation

## 2014-02-13 DIAGNOSIS — S299XXA Unspecified injury of thorax, initial encounter: Secondary | ICD-10-CM | POA: Diagnosis not present

## 2014-02-13 DIAGNOSIS — M545 Low back pain: Secondary | ICD-10-CM | POA: Diagnosis not present

## 2014-02-13 DIAGNOSIS — S22088A Other fracture of T11-T12 vertebra, initial encounter for closed fracture: Secondary | ICD-10-CM | POA: Diagnosis not present

## 2014-02-13 DIAGNOSIS — M549 Dorsalgia, unspecified: Secondary | ICD-10-CM

## 2014-02-13 DIAGNOSIS — S22089A Unspecified fracture of T11-T12 vertebra, initial encounter for closed fracture: Secondary | ICD-10-CM | POA: Diagnosis not present

## 2014-02-13 DIAGNOSIS — S3992XA Unspecified injury of lower back, initial encounter: Secondary | ICD-10-CM | POA: Diagnosis present

## 2014-02-13 MED ORDER — OXYCODONE-ACETAMINOPHEN 10-325 MG PO TABS: 1.0000 | ORAL_TABLET | ORAL | Status: DC | PRN

## 2014-02-13 MED ORDER — METHOCARBAMOL 500 MG PO TABS: 500.0000 mg | ORAL_TABLET | Freq: Two times a day (BID) | ORAL | Status: DC

## 2014-02-13 MED ORDER — ONDANSETRON 4 MG PO TBDP
8.0000 mg | ORAL_TABLET | Freq: Once | ORAL | Status: AC
  Administered 2014-02-13: 8 mg via ORAL
  Filled 2014-02-13: qty 2

## 2014-02-13 MED ORDER — METHOCARBAMOL 500 MG PO TABS
1000.0000 mg | ORAL_TABLET | Freq: Once | ORAL | Status: AC
  Administered 2014-02-13: 1000 mg via ORAL
  Filled 2014-02-13: qty 2

## 2014-02-13 MED ORDER — IBUPROFEN 400 MG PO TABS
800.0000 mg | ORAL_TABLET | Freq: Once | ORAL | Status: AC
  Administered 2014-02-13: 800 mg via ORAL
  Filled 2014-02-13: qty 2

## 2014-02-13 MED ORDER — IBUPROFEN 800 MG PO TABS: 800.0000 mg | ORAL_TABLET | Freq: Three times a day (TID) | ORAL | Status: DC

## 2014-02-13 MED ORDER — OXYCODONE-ACETAMINOPHEN 5-325 MG PO TABS
2.0000 | ORAL_TABLET | Freq: Once | ORAL | Status: AC
  Administered 2014-02-13: 2 via ORAL
  Filled 2014-02-13: qty 2

## 2014-02-13 NOTE — ED Provider Notes (Signed)
CSN: [id number]     Arrival date & time 02/13/14  1143 History  This chart was scribed for non-physician practitioner, Dierdre ForthHannah Cynithia Hakimi, PA-C working with Benny LennertJoseph L Zammit, MD by Greggory StallionKayla Andersen, ED scribe. This patient was seen in room TR06C/TR06C and the patient's care was started at 12:00 PM.    Chief Complaint  Patient presents with  . Back Pain   The history is provided by the patient and medical records. No language interpreter was used.    HPI Comments: Robert CarJoseph D Janicki is a 23 y.o. male who presents to the Emergency Department complaining of sudden onset, worsening mid back pain that started last night after trying to catch a 300-400 pound person from falling while at church. States he heard a pop and a crunch in his back when he tried to catch the person with immediate pain afterwards.  Pain is located in the midline, low back, rated at a 10 out of 10 and it does not radiate. Movement worsens it. Pt has not taken any medications for his symptoms. Denies bowel or bladder incontinence, numbness or tingling in legs, gait disturbance. Patient denies history of back problems or back surgery.  Past Medical History  Diagnosis Date  . Asthma    History reviewed. No pertinent past surgical history. History reviewed. No pertinent family history. History  Substance Use Topics  . Smoking status: Never Smoker   . Smokeless tobacco: Not on file  . Alcohol Use: No    Review of Systems  Constitutional: Negative for fever and fatigue.  Respiratory: Negative for chest tightness and shortness of breath.   Cardiovascular: Negative for chest pain.  Gastrointestinal: Negative for nausea, vomiting, abdominal pain and diarrhea.  Genitourinary: Negative for dysuria, urgency, frequency and hematuria.       Negative for bowel or bladder incontinence.  Musculoskeletal: Positive for back pain and gait problem (2/2 pain). Negative for joint swelling, neck pain and neck stiffness.  Skin: Negative for rash.   Neurological: Negative for weakness, light-headedness, numbness and headaches.  All other systems reviewed and are negative.  Allergies  Review of patient's allergies indicates no known allergies.  Home Medications   Prior to Admission medications   Medication Sig Start Date End Date Taking? Authorizing Provider  NON FORMULARY Apply 1 application topically daily as needed ("essential oils").   Yes Historical Provider, MD  Pseudoeph-Doxylamine-DM-APAP (NYQUIL MULTI-SYMPTOM PO) Take 2 capsules by mouth daily as needed (for cold).   Yes Historical Provider, MD  ibuprofen (ADVIL,MOTRIN) 800 MG tablet Take 1 tablet (800 mg total) by mouth 3 (three) times daily. With food 02/13/14   Dahlia ClientHannah Angello Chien, PA-C  methocarbamol (ROBAXIN) 500 MG tablet Take 1 tablet (500 mg total) by mouth 2 (two) times daily. 02/13/14   Rayshard Schirtzinger, PA-C  oxyCODONE-acetaminophen (PERCOCET) 10-325 MG per tablet Take 1 tablet by mouth every 4 (four) hours as needed for pain. 02/13/14   Marshella Tello, PA-C   BP 128/69 mmHg  Pulse 73  Temp(Src) 97.8 F (36.6 C) (Oral)  Resp 18  SpO2 93%   Physical Exam  Constitutional: He appears well-developed and well-nourished. No distress.  HENT:  Head: Normocephalic and atraumatic.  Mouth/Throat: Oropharynx is clear and moist. No oropharyngeal exudate.  Eyes: Conjunctivae are normal.  Neck: Normal range of motion. Neck supple.  Full ROM without pain  Cardiovascular: Normal rate, regular rhythm and intact distal pulses.   Pulmonary/Chest: Effort normal and breath sounds normal. No respiratory distress. He has no wheezes.  Abdominal: Soft.  He exhibits no distension. There is no tenderness.  Musculoskeletal:  Decreased range of motion of the T-spine and L-spine due to pain Midline spinous process tenderness of the T-spine No tenderness to palpation of the spinous processes of the L-spine  Tenderness to palpation of the paraspinous muscles of the T-spine and  L-spine No palpable step off or deformity  Lymphadenopathy:    He has no cervical adenopathy.  Neurological: He is alert. He has normal strength and normal reflexes. No sensory deficit. GCS eye subscore is 4. GCS verbal subscore is 5. GCS motor subscore is 6.  Reflex Scores:      Bicep reflexes are 2+ on the right side and 2+ on the left side.      Brachioradialis reflexes are 2+ on the right side and 2+ on the left side.      Patellar reflexes are 2+ on the right side and 2+ on the left side.      Achilles reflexes are 2+ on the right side and 2+ on the left side. Speech is clear and goal oriented, follows commands Normal 5/5 strength in upper and lower extremities bilaterally including dorsiflexion and plantar flexion, strong and equal grip strength Sensation normal to light and sharp touch Moves extremities without ataxia, coordination intact Antalgic gait -  Walking with a stooped back, no shuffling of the feet Normal balance No Clonus  Skin: Skin is warm and dry. No rash noted. He is not diaphoretic. No erythema.  Psychiatric: He has a normal mood and affect. His behavior is normal.  Nursing note and vitals reviewed.   ED Course  Procedures (including critical care time)  DIAGNOSTIC STUDIES: Oxygen Saturation is 93% on RA, adequate by my interpretation.    COORDINATION OF CARE: 12:03 PM-Discussed treatment plan which includes lumbar and thoracic xrays, percocet, robaxin and ibuprofen with pt at bedside and pt agreed to plan.   Labs Review Labs Reviewed - No data to display  Imaging Review Dg Thoracic Spine 2 View  02/13/2014   CLINICAL DATA:  Pt was at church yesterday and a person started seizing, the pt went to go catch them and they fell backwards with the person landing on the pt. The pt describes hearing a crunch and has been in pain ever since. The pain is around his lower T-spine and upper L-spine. He states that it travels around front and into his ribs, on both  sides. No Hx of injury or surgery  EXAM: THORACIC SPINE - 2 VIEW  COMPARISON:  None.  FINDINGS: No fracture. No bone lesion. There are no degenerative changes. Slight curvature of the mid thoracic spine, convex to the right. Soft tissues are unremarkable.  IMPRESSION: 1. No fracture or acute finding. 2. Minor dextroscoliosis.  No other abnormality.   Electronically Signed   By: Amie Portlandavid  Ormond M.D.   On: 02/13/2014 13:20   Dg Lumbar Spine Complete  02/13/2014   CLINICAL DATA:  Back pain status post fall yesterday. The pain is centered in the lower thoracic and upper lumbar spine region with radiation anteriorly.  EXAM: LUMBAR SPINE - COMPLETE 4+ VIEW  COMPARISON:  Thoracic spine series of today's date  FINDINGS: The lumbar vertebral bodies are preserved in height. Mild anterior superior endplate depression at T12 is present but is of uncertain age. There is no retropulsion of bone. The disc space heights are reasonably well maintained. There is no spondylolisthesis. There is a pseudoarthrosis on the right at L5. The pedicles and transverse processes  are intact. The observed portions of the lower ribs are unremarkable.  IMPRESSION: A small anterior superior endplate depressed fracture of T12 is suspected. Its the degree of depression is approximately 2 mm. The lumbar vertebral bodies are preserved in height and exhibit no significant degenerative change.   Electronically Signed   By: David  [place]   On: 02/13/2014 13:02     EKG Interpretation None      MDM   Final diagnoses:  Back pain  T12 vertebral fracture, closed, initial encounter   Robert Stokes presents with upper low-back pain after significant loadbearing. Patient's story concerning and on clinical exam he has midline tenderness. Will image T-spine and L-spine. Will give pain control, sore relaxer and reassess.  1:33 PM Patient reports adequate pain control at this time.  Ambulating with greater ease.  X-ray with small anterior superior  endplate depressed fracture of T12 with a depression of approximately 2 mm.  Discussed these findings with the patient.  He remains without neurologic deficit on repeat exam.  We'll discharge home with adequate pain control and muscle relaxers.  She has to follow closely with spine orthopedics and return to the emergency department immediately for any development of neurologic symptoms.  I have personally reviewed patient's vitals, nursing note and any pertinent labs or imaging.  I performed an focused physical exam; undressed when appropriate .    It has been determined that no acute conditions requiring further emergency intervention are present at this time. The patient/guardian have been advised of the diagnosis and plan. I reviewed any labs and imaging including any potential incidental findings. We have discussed signs and symptoms that warrant return to the ED and they are listed in the discharge instructions.    Vital signs are stable at discharge.   BP 128/69 mmHg  Pulse 73  Temp(Src) 97.8 F (36.6 C) (Oral)  Resp 18  SpO2 93%  I personally performed the services described in this documentation, which was scribed in my presence. The recorded information has been reviewed and is accurate.  Dierdre Forth, PA-C 02/13/14 1344  Benny Lennert, MD 02/13/14 [phone]

## 2014-02-13 NOTE — ED Notes (Signed)
C/o nausea. PA informed. Orders received.

## 2014-02-13 NOTE — Discharge Instructions (Signed)
1. Medications: robaxin, ibuprofen, Percocet, usual home medications 2. Treatment: rest, drink plenty of fluids, alternate ice and heat 3. Follow Up: Please followup with your primary doctor in 3 days for discussion of your diagnoses and further evaluation after today's visit; if you do not have a primary care doctor use the resource guide provided to find one;  Return to the ER for worsening back pain, difficulty walking, loss of bowel or bladder control or other concerning symptoms    Back, Compression Fracture A compression fracture happens when a force is put upon the length of your spine. Slipping and falling on your bottom are examples of such a force. When this happens, sometimes the force is great enough to compress the building blocks (vertebral bodies) of your spine. Although this causes a lot of pain, this can usually be treated at home, unless your caregiver feels hospitalization is needed for pain control. Your backbone (spinal column) is made up of 24 main vertebral bodies in addition to the sacrum and coccyx (see illustration). These are held together by tough fibrous tissues (ligaments) and by support of your muscles. Nerve roots pass through the openings between the vertebrae. A sudden wrenching move, injury, or a fall may cause a compression fracture of one of the vertebral bodies. This may result in back pain or spread of pain into the belly (abdomen), the buttocks, and down the leg into the foot. Pain may also be created by muscle spasm alone. Large studies have been undertaken to determine the best possible course of action to help your back following injury and also to prevent future problems. The recommendations are as follows. FOLLOWING A COMPRESSION FRACTURE: Do the following only if advised by your caregiver.   If a back brace has been suggested or provided, wear it as directed.  Do not stop wearing the back brace unless instructed by your caregiver.  When allowed to return  to regular activities, avoid a sedentary lifestyle. Actively exercise. Sporadic weekend binges of tennis, racquetball, or waterskiing may actually aggravate or create problems, especially if you are not in condition for that activity.  Avoid sports requiring sudden body movements until you are in condition for them. Swimming and walking are safer activities.  Maintain good posture.  Avoid obesity.  If not already done, you should have a DEXA scan. Based on the results, be treated for osteoporosis. FOLLOWING ACUTE (SUDDEN) INJURY:  Only take over-the-counter or prescription medicines for pain, discomfort, or fever as directed by your caregiver.  Use bed rest for only the most extreme acute episode. Prolonged bed rest may aggravate your condition. Ice used for acute conditions is effective. Use a large plastic bag filled with ice. Wrap it in a towel. This also provides excellent pain relief. This may be continuous. Or use it for 30 minutes every 2 hours during acute phase, then as needed. Heat for 30 minutes prior to activities is helpful.  As soon as the acute phase (the time when your back is too painful for you to do normal activities) is over, it is important to resume normal activities and work Arboriculturisthardening programs. Back injuries can cause potentially marked changes in lifestyle. So it is important to attack these problems aggressively.  See your caregiver for continued problems. He or she can help or refer you for appropriate exercises, physical therapy, and work hardening if needed.  If you are given narcotic medications for your condition, for the next 24 hours do not:  Drive.  Operate machinery  or power tools.  Sign legal documents.  Do not drink alcohol, or take sleeping pills or other medications that may interfere with treatment. If your caregiver has given you a follow-up appointment, it is very important to keep that appointment. Not keeping the appointment could result in a  chronic or permanent injury, pain, and disability. If there is any problem keeping the appointment, you must call back to this facility for assistance.  SEEK IMMEDIATE MEDICAL CARE IF:  You develop numbness, tingling, weakness, or problems with the use of your arms or legs.  You develop severe back pain not relieved with medications.  You have changes in bowel or bladder control.  You have increasing pain in any areas of the body. Document Released: 03/16/2005 Document Revised: 07/31/2013 Document Reviewed: 10/19/2007 [facility] Patient Information 2015 [place] [employer], [place]. This information is not intended to replace advice given to you by your health care provider. Make sure you discuss any questions you have with your health care provider.
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
## 2014-02-26 DIAGNOSIS — Z6841 Body Mass Index (BMI) 40.0 and over, adult: Secondary | ICD-10-CM | POA: Diagnosis not present

## 2014-02-26 DIAGNOSIS — M4854XA Collapsed vertebra, not elsewhere classified, thoracic region, initial encounter for fracture: Secondary | ICD-10-CM | POA: Diagnosis not present

## 2014-03-06 ENCOUNTER — Other Ambulatory Visit: Payer: Self-pay | Admitting: Neurosurgery

## 2014-03-06 DIAGNOSIS — S22000A Wedge compression fracture of unspecified thoracic vertebra, initial encounter for closed fracture: Secondary | ICD-10-CM

## 2014-03-17 ENCOUNTER — Ambulatory Visit
Admission: RE | Admit: 2014-03-17 | Discharge: 2014-03-17 | Disposition: A | Discharge status: Home / Self Care | Payer: Medicare Other | Source: Ambulatory Visit | Attending: Neurosurgery | Admitting: Neurosurgery

## 2014-03-17 DIAGNOSIS — S22000A Wedge compression fracture of unspecified thoracic vertebra, initial encounter for closed fracture: Secondary | ICD-10-CM

## 2014-03-17 DIAGNOSIS — M5127 Other intervertebral disc displacement, lumbosacral region: Secondary | ICD-10-CM | POA: Diagnosis not present

## 2014-03-17 DIAGNOSIS — M5145 Schmorl's nodes, thoracolumbar region: Secondary | ICD-10-CM | POA: Diagnosis not present

## 2014-03-17 DIAGNOSIS — M5137 Other intervertebral disc degeneration, lumbosacral region: Secondary | ICD-10-CM | POA: Diagnosis not present

## 2014-04-18 DIAGNOSIS — M4854XA Collapsed vertebra, not elsewhere classified, thoracic region, initial encounter for fracture: Secondary | ICD-10-CM | POA: Diagnosis not present

## 2014-04-18 DIAGNOSIS — Z6841 Body Mass Index (BMI) 40.0 and over, adult: Secondary | ICD-10-CM | POA: Diagnosis not present

## 2014-06-28 DIAGNOSIS — M545 Low back pain: Secondary | ICD-10-CM | POA: Diagnosis not present

## 2014-06-28 DIAGNOSIS — Z131 Encounter for screening for diabetes mellitus: Secondary | ICD-10-CM | POA: Diagnosis not present

## 2014-06-28 DIAGNOSIS — Z6841 Body Mass Index (BMI) 40.0 and over, adult: Secondary | ICD-10-CM | POA: Diagnosis not present

## 2014-07-26 DIAGNOSIS — M545 Low back pain: Secondary | ICD-10-CM | POA: Diagnosis not present

## 2014-07-26 DIAGNOSIS — Z6841 Body Mass Index (BMI) 40.0 and over, adult: Secondary | ICD-10-CM | POA: Diagnosis not present

## 2014-10-10 ENCOUNTER — Encounter (HOSPITAL_COMMUNITY): Payer: Self-pay | Admitting: *Deleted

## 2014-10-10 ENCOUNTER — Emergency Department (HOSPITAL_COMMUNITY)
Admission: EM | Admit: 2014-10-10 | Discharge: 2014-10-10 | Disposition: A | Discharge status: Home / Self Care | Payer: Medicare Other | Attending: Emergency Medicine | Admitting: Emergency Medicine

## 2014-10-10 DIAGNOSIS — H578 Other specified disorders of eye and adnexa: Secondary | ICD-10-CM | POA: Insufficient documentation

## 2014-10-10 DIAGNOSIS — E669 Obesity, unspecified: Secondary | ICD-10-CM | POA: Insufficient documentation

## 2014-10-10 DIAGNOSIS — Z79899 Other long term (current) drug therapy: Secondary | ICD-10-CM | POA: Diagnosis not present

## 2014-10-10 DIAGNOSIS — H538 Other visual disturbances: Secondary | ICD-10-CM | POA: Insufficient documentation

## 2014-10-10 DIAGNOSIS — R600 Localized edema: Secondary | ICD-10-CM | POA: Insufficient documentation

## 2014-10-10 DIAGNOSIS — J45909 Unspecified asthma, uncomplicated: Secondary | ICD-10-CM | POA: Insufficient documentation

## 2014-10-10 DIAGNOSIS — Z791 Long term (current) use of non-steroidal anti-inflammatories (NSAID): Secondary | ICD-10-CM | POA: Insufficient documentation

## 2014-10-10 DIAGNOSIS — H0289 Other specified disorders of eyelid: Secondary | ICD-10-CM

## 2014-10-10 HISTORY — DX: Obesity, unspecified: E66.9

## 2014-10-10 MED ORDER — TETRACAINE HCL 0.5 % OP SOLN
2.0000 [drp] | Freq: Once | OPHTHALMIC | Status: AC
  Administered 2014-10-10: 2 [drp] via OPHTHALMIC
  Filled 2014-10-10: qty 2

## 2014-10-10 MED ORDER — ERYTHROMYCIN 5 MG/GM OP OINT
1.0000 "application " | TOPICAL_OINTMENT | Freq: Four times a day (QID) | OPHTHALMIC | Status: DC
  Administered 2014-10-10: 1 via OPHTHALMIC
  Filled 2014-10-10: qty 3.5

## 2014-10-10 MED ORDER — FLUORESCEIN SODIUM 1 MG OP STRP
1.0000 | ORAL_STRIP | Freq: Once | OPHTHALMIC | Status: DC
  Filled 2014-10-10: qty 1
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
## 2014-10-10 NOTE — ED Notes (Signed)
Pt reports having injury to left eye on Saturday night, feels like something is in his eye. Having burning pain and redness noted. Denies vision changes.

## 2014-10-10 NOTE — Discharge Instructions (Signed)
Apply erythromycin ointment every 6 hours for 7 days. Cool compresses. Avoid rubbing eye. Follow-up with primary care doctor or optometrist.

## 2014-11-28 ENCOUNTER — Emergency Department (HOSPITAL_COMMUNITY)
Admission: EM | Admit: 2014-11-28 | Discharge: 2014-11-28 | Disposition: A | Discharge status: Home / Self Care | Payer: Medicare Other | Attending: Emergency Medicine | Admitting: Emergency Medicine

## 2014-11-28 ENCOUNTER — Emergency Department (HOSPITAL_COMMUNITY): Payer: Medicare Other

## 2014-11-28 ENCOUNTER — Encounter (HOSPITAL_COMMUNITY): Payer: Self-pay

## 2014-11-28 DIAGNOSIS — Z79899 Other long term (current) drug therapy: Secondary | ICD-10-CM | POA: Diagnosis not present

## 2014-11-28 DIAGNOSIS — S6991XA Unspecified injury of right wrist, hand and finger(s), initial encounter: Secondary | ICD-10-CM | POA: Diagnosis not present

## 2014-11-28 DIAGNOSIS — Y9374 Activity, frisbee: Secondary | ICD-10-CM | POA: Insufficient documentation

## 2014-11-28 DIAGNOSIS — Y998 Other external cause status: Secondary | ICD-10-CM | POA: Insufficient documentation

## 2014-11-28 DIAGNOSIS — M79672 Pain in left foot: Secondary | ICD-10-CM | POA: Diagnosis not present

## 2014-11-28 DIAGNOSIS — M79662 Pain in left lower leg: Secondary | ICD-10-CM | POA: Diagnosis not present

## 2014-11-28 DIAGNOSIS — E669 Obesity, unspecified: Secondary | ICD-10-CM | POA: Diagnosis not present

## 2014-11-28 DIAGNOSIS — S63206A Unspecified subluxation of right little finger, initial encounter: Secondary | ICD-10-CM | POA: Diagnosis not present

## 2014-11-28 DIAGNOSIS — S63236A Subluxation of proximal interphalangeal joint of right little finger, initial encounter: Secondary | ICD-10-CM | POA: Insufficient documentation

## 2014-11-28 DIAGNOSIS — S8992XA Unspecified injury of left lower leg, initial encounter: Secondary | ICD-10-CM | POA: Diagnosis not present

## 2014-11-28 DIAGNOSIS — S63209A Unspecified subluxation of unspecified finger, initial encounter: Secondary | ICD-10-CM

## 2014-11-28 DIAGNOSIS — X58XXXA Exposure to other specified factors, initial encounter: Secondary | ICD-10-CM | POA: Diagnosis not present

## 2014-11-28 DIAGNOSIS — J45909 Unspecified asthma, uncomplicated: Secondary | ICD-10-CM | POA: Diagnosis not present

## 2014-11-28 DIAGNOSIS — S99922A Unspecified injury of left foot, initial encounter: Secondary | ICD-10-CM | POA: Diagnosis not present

## 2014-11-28 DIAGNOSIS — Y9289 Other specified places as the place of occurrence of the external cause: Secondary | ICD-10-CM | POA: Diagnosis not present

## 2014-11-28 DIAGNOSIS — M7989 Other specified soft tissue disorders: Secondary | ICD-10-CM | POA: Diagnosis not present

## 2014-11-28 MED ORDER — NAPROXEN 250 MG PO TABS: 250.0000 mg | ORAL_TABLET | Freq: Two times a day (BID) | ORAL | Status: DC

## 2014-11-28 MED ORDER — ACETAMINOPHEN 325 MG PO TABS
650.0000 mg | ORAL_TABLET | Freq: Once | ORAL | Status: AC
  Administered 2014-11-28: 650 mg via ORAL
  Filled 2014-11-28: qty 2

## 2014-11-28 NOTE — ED Notes (Signed)
Was playing ultimate frisby and rolled into a ditch and has pain in his left leg now. Felt a pop in his foot and has pain from his shin down. Also has pain to his right pinky finger.

## 2014-11-28 NOTE — ED Provider Notes (Signed)
CSN: [id number]     Arrival date & time 11/28/14  2105 History  This chart was scribed for [name], PA-C, working with Gerhard Munch, MD by Octavia Heir, ED Scribe. This patient was seen in room TR10C/TR10C and the patient's care was started at 10:20 PM.     Chief Complaint  Patient presents with  . Leg Pain      The history is provided by the patient. No language interpreter was used.   HPI Comments: Robert Stokes is a 24 y.o. male who presents to the Emergency Department complaining of sudden onset, gradual worsening left foot pain onset this evening. He has associated right fifth finger pain and left shin pain. Pt reports his pain feels like pins and needles. Pt was playing a game of ultimate frisbee when he rolled into a ditch and landed on his left foot stating all of his body weight was on that area. Pt states there is no pain unless he moves his foot a certain way and it begins to have sharp pain. He denies numbness, ankle pain, weakness, head injury, neck pain, back pain, loss of consciousness, and hand pain.  Past Medical History  Diagnosis Date  . Asthma   . Obesity    History reviewed. No pertinent past surgical history. History reviewed. No pertinent family history. Social History  Substance Use Topics  . Smoking status: Never Smoker   . Smokeless tobacco: None  . Alcohol Use: No    Review of Systems  Constitutional: Negative for fever.  Musculoskeletal: Positive for joint swelling and arthralgias. Negative for back pain and neck pain.  Skin: Negative for rash and wound.  Neurological: Negative for syncope, weakness, numbness and headaches.      Allergies  Review of patient's allergies indicates no known allergies.  Home Medications   Prior to Admission medications   Medication Sig Start Date End Date Taking? Authorizing Provider  ibuprofen (ADVIL,MOTRIN) 800 MG tablet Take 1 tablet (800 mg total) by mouth 3 (three) times daily. With food 02/13/14    Dahlia Client Muthersbaugh, PA-C  methocarbamol (ROBAXIN) 500 MG tablet Take 1 tablet (500 mg total) by mouth 2 (two) times daily. 02/13/14   Hannah Muthersbaugh, PA-C  naproxen (NAPROSYN) 250 MG tablet Take 1 tablet (250 mg total) by mouth 2 (two) times daily with a meal. 11/28/14   [name], PA-C  NON FORMULARY Apply 1 application topically daily as needed ("essential oils").    Historical Provider, MD  oxyCODONE-acetaminophen (PERCOCET) 10-325 MG per tablet Take 1 tablet by mouth every 4 (four) hours as needed for pain. 02/13/14   Hannah Muthersbaugh, PA-C  Pseudoeph-Doxylamine-DM-APAP (NYQUIL MULTI-SYMPTOM PO) Take 2 capsules by mouth daily as needed (for cold).    Historical Provider, MD   Triage vitals: BP 129/74 mmHg  Pulse 74  Temp(Src) 97.5 F (36.4 C) (Oral)  Resp 16  SpO2 97% Physical Exam  Constitutional: He is oriented to person, place, and time. He appears well-developed and well-nourished. No distress.  Nontoxic appearing. Obese male.  HENT:  Head: Normocephalic and atraumatic.  Right Ear: External ear normal.  Left Ear: External ear normal.  Eyes: Right eye exhibits no discharge. Left eye exhibits no discharge.  Cardiovascular: Normal rate and intact distal pulses.   Good capillary refill of bilateral distal toes.  good capillary refill to right 5th finger.  Bilateral radial, posterior tibialis and dorsalis pedis pulses are intact.    Pulmonary/Chest: Effort normal. No respiratory distress.  Musculoskeletal: He exhibits edema  and tenderness.  Patient has tenderness over his right pinky PIP joint and is unable to bend his finger.  Patient also has mild tenderness over his left anterior shin and the dorsal aspect of his left foot. There is no bony deformity noted to his bilateral lower extremities. He has good range of motion of his left ankle. No left ankle tenderness. No left foot, ankle, leg or knee edema or deformity noted.   Neurological: He is alert and oriented to  person, place, and time. Coordination normal.  Sensation intact to his bilateral lower extremities.  Skin: Skin is dry. No rash noted. He is not diaphoretic. No erythema. No pallor.  Psychiatric: He has a normal mood and affect. His behavior is normal.  Nursing note and vitals reviewed.   ED Course  Reduction of dislocation Date/Time: 11/28/2014 10:40 PM Performed by: [name] Authorized by: [name] Consent: Verbal consent obtained. Risks and benefits: risks, benefits and alternatives were discussed Consent given by: patient Patient understanding: patient states understanding of the procedure being performed Patient consent: the patient's understanding of the procedure matches consent given Procedure consent: procedure consent matches procedure scheduled Relevant documents: relevant documents present and verified Test results: test results available and properly labeled Site marked: the operative site was marked Imaging studies: imaging studies available Required items: required blood products, implants, devices, and special equipment available Patient identity confirmed: verbally with patient Time out: Immediately prior to procedure a "time out" was called to verify the correct patient, procedure, equipment, support staff and site/side marked as required. Local anesthesia used: no Patient sedated: no Patient tolerance: Patient tolerated the procedure well with no immediate complications Comments: Right 5th finger subluxation was reduced.     DIAGNOSTIC STUDIES: Oxygen Saturation is 97% on RA, normal by my interpretation.  COORDINATION OF CARE:  10:25 PM Discussed treatment plan with pt at bedside and pt agreed to plan.  Labs Review Labs Reviewed - No data to display  Imaging Review Dg Tibia/fibula Left  11/28/2014   CLINICAL DATA:  Injury playing frisbee tonight. Left lower leg pain and left metatarsal pain and swelling.  EXAM: LEFT TIBIA AND FIBULA - 2 VIEW   COMPARISON:  None.  FINDINGS: There is no evidence of fracture or other focal bone lesions. Soft tissues are unremarkable.  IMPRESSION: Negative.   Electronically Signed   By: Elberta Fortis M.D.   On: 11/28/2014 22:08   Dg Finger Little Right  11/28/2014   CLINICAL DATA:  Trauma to the right fifth digit while playing frisbee  EXAM: RIGHT LITTLE FINGER 2+V  COMPARISON:  None.  FINDINGS: There is no evidence of fracture or dislocation, although there is mild anterior subluxation of the middle phalanx with respect to the proximal phalanx of the right fifth digit. This is best seen on the lateral view. There is no evidence of arthropathy or other focal bone abnormality. Soft tissues are unremarkable.  IMPRESSION: Minimal anterior subluxation of the middle phalanx with respect to the proximal phalanx of the right fifth digit.   Electronically Signed   By: Christiana Pellant M.D.   On: 11/28/2014 22:10   Dg Foot Complete Left  11/28/2014   CLINICAL DATA:  Injury playing appears to tonight with left lower leg and foot pain.  EXAM: LEFT FOOT - COMPLETE 3+ VIEW  COMPARISON:  None.  FINDINGS: There is no evidence of fracture or dislocation. There is no evidence of arthropathy or other focal bone abnormality. Soft tissues are unremarkable.  IMPRESSION:  Negative.   Electronically Signed   By: Elberta Fortis M.D.   On: 11/28/2014 22:09   I have personally reviewed and evaluated these images as part of my medical decision-making.   EKG Interpretation None      Filed Vitals:   11/28/14 2110  BP: 129/74  Pulse: 74  Temp: 97.5 F (36.4 C)  TempSrc: Oral  Resp: 16  SpO2: 97%     MDM   Meds given in ED:  Medications  acetaminophen (TYLENOL) tablet 650 mg (650 mg Oral Given 11/28/14 2237)    Discharge Medication List as of 11/28/2014 10:51 PM    START taking these medications   Details  naproxen (NAPROSYN) 250 MG tablet Take 1 tablet (250 mg total) by mouth 2 (two) times daily with a meal., Starting  11/28/2014, Until Discontinued, Print        Final diagnoses:  Subluxation of finger, initial encounter  Left foot pain  Pain in left shin   This is a 25 year old male who presents to the emergency department complaining of left shin pain, left foot pain, and right pinky finger pain after he fell while playing ultimate Frisbee earlier today. The patient denies hitting his head or loss of consciousness. He denies neck or back pain. On exam the patient is afebrile nontoxic appearing. The patient has tenderness over his left pinky PIP joint and is unable to move his finger at his PIP joint. X-ray indicates a minimal anterior subluxation of the middle phalanx of the right fifth digit. I reduced this patient's finger subluxation without difficulty at bedside. Post reduction the patient has good range of motion of his right little finger and is neurovascularly intact. X-rays of his left tibia and fibula as well as left foot are unremarkable. His exam of his bilateral lower extremity is are unremarkable, as above. He reports his pain in his foot is worse with walking. Will place in postop shoe to provide more support and have him follow-up with primary care. I advised patient to follow-up with hand surgery for his finger subluxation for recheck. Patient's fingers were buddy taped and a finger splint. I advised the patient to follow-up with their primary care provider this week. I advised the patient to return to the emergency department with new or worsening symptoms or new concerns. The patient verbalized understanding and agreement with plan.    I personally performed the services described in this documentation, which was scribed in my presence. The recorded information has been reviewed and is accurate.    [name], PA-C 11/29/14 1610  Gerhard Munch, MD 11/29/14 [phone]

## 2014-11-28 NOTE — ED Notes (Signed)
Patient transported to X-ray 

## 2014-11-28 NOTE — Discharge Instructions (Signed)
Finger Dislocation (subluxation)  Finger dislocation is the displacement of bones in your finger at the joints. Most commonly, finger dislocation occurs at the proximal interphalangeal joint (the joint closest to your knuckle). Very strong, fibrous tissues (ligaments) and joint capsules connect the three bones of your fingers.  CAUSES Dislocation is caused by a forceful impact. This impact moves these bones off the joint and often tears your ligaments.  SYMPTOMS Symptoms of finger dislocation include:  Deformity of your finger.  Pain, with loss of movement. DIAGNOSIS  Finger dislocation is diagnosed with a physical exam. Often, X-ray exams are done to see if you have associated injuries, such as bone fractures. TREATMENT  Finger dislocations are treated by putting your bones back into position (reduction) either by manually moving the bones back into place or through surgery. Your finger is then kept in a fixed position (immobilized) with the use of a dressing or splint for a brief period. When your ligament has to be surgically repaired, it needs to be kept in a fixed position with a dressing or splint for 1 to 2 weeks. Because joint stiffness is a long-term complication of finger dislocation, hand exercises or physical therapy to increase the range of motion and to regain strength is usually started as soon as the ligament is healed. Exercises and therapy generally last no more than 3 months. HOME CARE INSTRUCTIONS The following measures can help to reduce pain and speed up the healing process:  Rest your injured joint. Do not move until instructed otherwise by your caregiver. Avoid activities similar to the one that caused your injury.  Apply ice to your injured joint for the first day or 2 after your reduction or as directed by your caregiver. Applying ice helps to reduce inflammation and pain.  Put ice in a plastic bag.  Place a towel between your skin and the bag.  Leave the ice on  for 15-20 minutes at a time, every 2 hours while you are awake.  Elevate your hand above your heart as directed by your caregiver to reduce swelling.  Take over-the-counter or prescription medicine for pain as your caregiver instructs you. SEEK IMMEDIATE MEDICAL CARE IF:  Your dressing or splint becomes damaged.  Your pain becomes worse rather than better.  You lose feeling in your finger, or it becomes cold and white. MAKE SURE YOU:  Understand these instructions.  Will watch your condition.  Will get help right away if you are not doing well or get worse. Document Released: 03/13/2000 Document Revised: 06/08/2011 Document Reviewed: 01/04/2011 [facility] Patient Information 2015 [place], [place]. This information is not intended to replace advice given to you by your health care provider. Make sure you discuss any questions you have with your health care provider. Buddy Taping You have a minor finger or toe injury. It can be managed by buddy taping. Buddy taping means the injured finger or toe is taped to a healthy uninjured adjacent finger or toe. Most minor fractures and dislocations of the smaller fingers and toes will heal in 3 to 4 weeks. Buddy taping immobilizes and protects the area of injury. Buddy taping is not recommended for initial treatment of fractures of the thumb, longer fingers, or the great toe. Buddy taping should not be used for unstable or deformed fractures, but as fracture healing progresses it may be used for protection during rehabilitation. Fractured fingers and toes should be protected by buddy taping as long as the injury is still painful or swollen.  When  an injury is buddy taped, place a small piece of gauze or cotton between the digits that are taped. This helps prevent the skin from breaking down from increased moisture. Buddy taping allows you to get your injury wet when you bathe. Change the gauze and tape more often if it gets wet, and dry the space between the  finger or toes. Use a sturdy, hard-soled shoe for better support if you have a fractured toe. In 2 to 3 weeks you can start motion exercises. This will keep the fingers or toes from becoming stiff.  SEEK IMMEDIATE MEDICAL CARE IF:   The injured area becomes cold, numb, or pale.  You have pain not controlled with medications.  You notice increasing deformity of the toe or finger. Document Released: 04/23/2004 Document Revised: 06/08/2011 Document Reviewed: 08/22/2008 [facility] Patient Information 2015 [place], [place]. This information is not intended to replace advice given to you by your health care provider. Make sure you discuss any questions you have with your health care provider. Foot Sprain The muscles and cord like structures which attach muscle to bone (tendons) that surround the feet are made up of units. A foot sprain can occur at the weakest spot in any of these units. This condition is most often caused by injury to or overuse of the foot, as from playing contact sports, or aggravating a previous injury, or from poor conditioning, or obesity. SYMPTOMS  Pain with movement of the foot.  Tenderness and swelling at the injury site.  Loss of strength is present in moderate or severe sprains. THE THREE GRADES OR SEVERITY OF FOOT SPRAIN ARE:  Mild (Grade I): Slightly pulled muscle without tearing of muscle or tendon fibers or loss of strength.  Moderate (Grade II): Tearing of fibers in a muscle, tendon, or at the attachment to bone, with small decrease in strength.  Severe (Grade III): Rupture of the muscle-tendon-bone attachment, with separation of fibers. Severe sprain requires surgical repair. Often repeating (chronic) sprains are caused by overuse. Sudden (acute) sprains are caused by direct injury or over-use. DIAGNOSIS  Diagnosis of this condition is usually by your own observation. If problems continue, a caregiver may be required for further evaluation and treatment. X-rays may  be required to make sure there are not breaks in the bones (fractures) present. Continued problems may require physical therapy for treatment. PREVENTION  Use strength and conditioning exercises appropriate for your sport.  Warm up properly prior to working out.  Use athletic shoes that are made for the sport you are participating in.  Allow adequate time for healing. Early return to activities makes repeat injury more likely, and can lead to an unstable arthritic foot that can result in prolonged disability. Mild sprains generally heal in 3 to 10 days, with moderate and severe sprains taking 2 to 10 weeks. Your caregiver can help you determine the proper time required for healing. HOME CARE INSTRUCTIONS   Apply ice to the injury for 15-20 minutes, 03-04 times per day. Put the ice in a plastic bag and place a towel between the bag of ice and your skin.  An elastic wrap (like an Ace bandage) may be used to keep swelling down.  Keep foot above the level of the heart, or at least raised on a footstool, when swelling and pain are present.  Try to avoid use other than gentle range of motion while the foot is painful. Do not resume use until instructed by your caregiver. Then begin use gradually, not  increasing use to the point of pain. If pain does develop, decrease use and continue the above measures, gradually increasing activities that do not cause discomfort, until you gradually achieve normal use.  Use crutches if and as instructed, and for the length of time instructed.  Keep injured foot and ankle wrapped between treatments.  Massage foot and ankle for comfort and to keep swelling down. Massage from the toes up towards the knee.  Only take over-the-counter or prescription medicines for pain, discomfort, or fever as directed by your caregiver. SEEK IMMEDIATE MEDICAL CARE IF:   Your pain and swelling increase, or pain is not controlled with medications.  You have loss of feeling in  your foot or your foot turns cold or blue.  You develop new, unexplained symptoms, or an increase of the symptoms that brought you to your caregiver. MAKE SURE YOU:   Understand these instructions.  Will watch your condition.  Will get help right away if you are not doing well or get worse. Document Released: 09/05/2001 Document Revised: 06/08/2011 Document Reviewed: 11/03/2007 [facility] Patient Information 2015 [place], [place]. This information is not intended to replace advice given to you by your health care provider. Make sure you discuss any questions you have with your health care provider.

## 2015-01-03 ENCOUNTER — Emergency Department (HOSPITAL_COMMUNITY): Payer: Medicare Other

## 2015-01-03 ENCOUNTER — Emergency Department (HOSPITAL_COMMUNITY)
Admission: EM | Admit: 2015-01-03 | Discharge: 2015-01-03 | Disposition: A | Discharge status: Home / Self Care | Payer: Medicare Other | Attending: Emergency Medicine | Admitting: Emergency Medicine

## 2015-01-03 ENCOUNTER — Encounter (HOSPITAL_COMMUNITY): Payer: Self-pay | Admitting: Emergency Medicine

## 2015-01-03 DIAGNOSIS — E669 Obesity, unspecified: Secondary | ICD-10-CM | POA: Diagnosis not present

## 2015-01-03 DIAGNOSIS — Z791 Long term (current) use of non-steroidal anti-inflammatories (NSAID): Secondary | ICD-10-CM | POA: Insufficient documentation

## 2015-01-03 DIAGNOSIS — Y9289 Other specified places as the place of occurrence of the external cause: Secondary | ICD-10-CM | POA: Diagnosis not present

## 2015-01-03 DIAGNOSIS — Y998 Other external cause status: Secondary | ICD-10-CM | POA: Diagnosis not present

## 2015-01-03 DIAGNOSIS — Y9367 Activity, basketball: Secondary | ICD-10-CM | POA: Diagnosis not present

## 2015-01-03 DIAGNOSIS — W500XXA Accidental hit or strike by another person, initial encounter: Secondary | ICD-10-CM | POA: Diagnosis not present

## 2015-01-03 DIAGNOSIS — M25561 Pain in right knee: Secondary | ICD-10-CM | POA: Diagnosis not present

## 2015-01-03 DIAGNOSIS — Z79899 Other long term (current) drug therapy: Secondary | ICD-10-CM | POA: Diagnosis not present

## 2015-01-03 DIAGNOSIS — J45909 Unspecified asthma, uncomplicated: Secondary | ICD-10-CM | POA: Diagnosis not present

## 2015-01-03 DIAGNOSIS — S8001XA Contusion of right knee, initial encounter: Secondary | ICD-10-CM | POA: Diagnosis not present

## 2015-01-03 DIAGNOSIS — S8991XA Unspecified injury of right lower leg, initial encounter: Secondary | ICD-10-CM | POA: Diagnosis present

## 2015-01-03 MED ORDER — IBUPROFEN 800 MG PO TABS: 800.0000 mg | ORAL_TABLET | Freq: Three times a day (TID) | ORAL | Status: DC

## 2015-01-03 MED ORDER — IBUPROFEN 800 MG PO TABS
800.0000 mg | ORAL_TABLET | Freq: Once | ORAL | Status: AC
  Administered 2015-01-03: 800 mg via ORAL
  Filled 2015-01-03: qty 1

## 2015-01-03 NOTE — ED Provider Notes (Signed)
CSN: [id number]     Arrival date & time 01/03/15  1523 History  By signing my name below, I, Elon Spanner, attest that this documentation has been prepared under the direction and in the presence of Eber Hong, MD. Electronically Signed: Elon Spanner, ED Scribe. 01/03/2015. 3:44 PM.    Chief Complaint  Patient presents with  . Knee Pain   The history is provided by the patient. No language interpreter was used.   HPI Comments: Robert Stokes is a 24 y.o. male who presents to the Emergency Department complaining of acute onset of right knee pain with a collision to another player's knee, the goal post and cement.  At the time of the injury he felt a pulling and pop.  He is able to ambulate normally.    Pain is constant, worse with ROM and associated with bruising.  Past Medical History  Diagnosis Date  . Asthma   . Obesity    History reviewed. No pertinent past surgical history. No family history on file. Social History  Substance Use Topics  . Smoking status: Never Smoker   . Smokeless tobacco: None  . Alcohol Use: No    Review of Systems  Constitutional: Negative for fever.  Musculoskeletal: Positive for joint swelling and arthralgias.      Allergies  Review of patient's allergies indicates no known allergies.  Home Medications   Prior to Admission medications   Medication Sig Start Date End Date Taking? Authorizing Provider  ibuprofen (ADVIL,MOTRIN) 800 MG tablet Take 1 tablet (800 mg total) by mouth 3 (three) times daily. 01/03/15   Eber Hong, MD  methocarbamol (ROBAXIN) 500 MG tablet Take 1 tablet (500 mg total) by mouth 2 (two) times daily. 02/13/14   Hannah Muthersbaugh, PA-C  naproxen (NAPROSYN) 250 MG tablet Take 1 tablet (250 mg total) by mouth 2 (two) times daily with a meal. 11/28/14   [name], PA-C  NON FORMULARY Apply 1 application topically daily as needed ("essential oils").    Historical Provider, MD  oxyCODONE-acetaminophen (PERCOCET) 10-325 MG  per tablet Take 1 tablet by mouth every 4 (four) hours as needed for pain. 02/13/14   Hannah Muthersbaugh, PA-C  Pseudoeph-Doxylamine-DM-APAP (NYQUIL MULTI-SYMPTOM PO) Take 2 capsules by mouth daily as needed (for cold).    Historical Provider, MD   BP 128/70 mmHg  Pulse 63  Temp(Src) 97.8 F (36.6 C) (Oral)  Resp 20  SpO2 95% Physical Exam  Constitutional: He appears well-developed and well-nourished.  HENT:  Head: Normocephalic and atraumatic.  Eyes: Conjunctivae are normal. Right eye exhibits no discharge. Left eye exhibits no discharge.  Pulmonary/Chest: Effort normal. No respiratory distress.  Musculoskeletal:  Bruising and hematoma over the patella anteriorly with decreased ROM.  Otherwise stable knee joint to stress.  preservability to SLR.   Neurological: He is alert. Coordination normal.  Normal strength sensation of right lower extremity.   Skin: Skin is warm and dry. No rash noted. He is not diaphoretic. No erythema.  Bruising over right patella.   Psychiatric: He has a normal mood and affect.  Nursing note and vitals reviewed.   ED Course  Procedures (including critical care time)  DIAGNOSTIC STUDIES: Oxygen Saturation is 95% on RA, normal by my interpretation.    COORDINATION OF CARE:  3:41 PM  Will order imaging of right knee.  If normal, will discharge home with knee immobilizer, crutches, orthopaedic referral.  Patient acknowledges and agrees with plan.    Labs Review Labs Reviewed - No data  to display  Imaging Review Dg Knee Complete 4 Views Right  01/03/2015   CLINICAL DATA:  Her RIGHT knee playing basketball Tuesday, prepatellar bruise, severe anterior pain  EXAM: RIGHT KNEE - COMPLETE 4+ VIEW  COMPARISON:  01/05/2007  FINDINGS: Bone mineralization normal.  Joint spaces preserved.  No fracture, dislocation, or bone destruction.  No joint effusion.  Minimal prepatellar soft tissue swelling.  IMPRESSION: No acute osseous abnormalities.   Electronically Signed    By: Ulyses Southward M.D.   On: 01/03/2015 16:13   I have personally reviewed and evaluated these images and lab results as part of my medical decision-making.    MDM   Final diagnoses:  Contusion of right knee, initial encounter    Tenderness and mild swelling about the right anterior knee, he has decreased range of motion secondary to pain, it is difficult to tell if there is swelling since he is very obese, the knee joint does appear to be stable, x-rays ordered to rule out fracture, knee immobilizer, crutches which the patient heart he has, Rice therapy, this was explained to the patient in full, he expresses his understanding.  I have personally viewed and interpreted the imaging and agree with radiologist interpretation.   Meds given in ED:  Medications  ibuprofen (ADVIL,MOTRIN) tablet 800 mg (not administered)    New Prescriptions   IBUPROFEN (ADVIL,MOTRIN) 800 MG TABLET    Take 1 tablet (800 mg total) by mouth 3 (three) times daily.    I, Eber Hong D, personally performed the services described in this documentation. All medical record entries made by the scribe were at my direction and in my presence.  I have reviewed the chart and discharge instructions and agree that the record reflects my personal performance and is accurate and complete. Babe Clenney D.  01/03/2015. 4:26 PM.       Eber Hong, MD 01/03/15 1626

## 2015-01-03 NOTE — ED Notes (Signed)
Made Ortho aware of knee immobilizer.

## 2015-01-03 NOTE — Discharge Instructions (Signed)

## 2015-01-03 NOTE — ED Notes (Signed)
Pt states that he was playing basketball 2 days ago and had a friend step on his ankle causing him to fall.  Pt hit his right knee on goal post and concrete.  Pt states that he heard a "popping sound, like when you stretch a rubber band and let it go". Pt states that he got up and shrugged it off. Pt c/o the pain has gotten worse over the past couple days and having trouble bearing weight on it now.

## 2015-01-27 ENCOUNTER — Emergency Department (HOSPITAL_COMMUNITY)
Admission: EM | Admit: 2015-01-27 | Discharge: 2015-01-27 | Disposition: A | Discharge status: Home / Self Care | Payer: Medicare Other | Attending: Emergency Medicine | Admitting: Emergency Medicine

## 2015-01-27 ENCOUNTER — Encounter (HOSPITAL_COMMUNITY): Payer: Self-pay | Admitting: Family Medicine

## 2015-01-27 ENCOUNTER — Emergency Department (HOSPITAL_COMMUNITY): Payer: Medicare Other

## 2015-01-27 DIAGNOSIS — J45901 Unspecified asthma with (acute) exacerbation: Secondary | ICD-10-CM | POA: Diagnosis not present

## 2015-01-27 DIAGNOSIS — R05 Cough: Secondary | ICD-10-CM | POA: Diagnosis not present

## 2015-01-27 DIAGNOSIS — R0602 Shortness of breath: Secondary | ICD-10-CM | POA: Diagnosis present

## 2015-01-27 DIAGNOSIS — J392 Other diseases of pharynx: Secondary | ICD-10-CM | POA: Diagnosis not present

## 2015-01-27 DIAGNOSIS — R509 Fever, unspecified: Secondary | ICD-10-CM | POA: Diagnosis not present

## 2015-01-27 DIAGNOSIS — R52 Pain, unspecified: Secondary | ICD-10-CM | POA: Diagnosis not present

## 2015-01-27 DIAGNOSIS — J40 Bronchitis, not specified as acute or chronic: Secondary | ICD-10-CM

## 2015-01-27 DIAGNOSIS — E669 Obesity, unspecified: Secondary | ICD-10-CM | POA: Insufficient documentation

## 2015-01-27 MED ORDER — PREDNISONE 20 MG PO TABS
60.0000 mg | ORAL_TABLET | Freq: Once | ORAL | Status: AC
  Administered 2015-01-27: 60 mg via ORAL
  Filled 2015-01-27: qty 3

## 2015-01-27 MED ORDER — BENZONATATE 100 MG PO CAPS: 100.0000 mg | ORAL_CAPSULE | Freq: Three times a day (TID) | ORAL | Status: DC

## 2015-01-27 MED ORDER — ALBUTEROL SULFATE (2.5 MG/3ML) 0.083% IN NEBU
5.0000 mg | INHALATION_SOLUTION | Freq: Once | RESPIRATORY_TRACT | Status: AC
  Administered 2015-01-27: 5 mg via RESPIRATORY_TRACT
  Filled 2015-01-27: qty 6

## 2015-01-27 MED ORDER — ALBUTEROL SULFATE HFA 108 (90 BASE) MCG/ACT IN AERS: 1.0000 | INHALATION_SPRAY | RESPIRATORY_TRACT | Status: DC | PRN

## 2015-01-27 MED ORDER — AZITHROMYCIN 250 MG PO TABS: 250.0000 mg | ORAL_TABLET | Freq: Every day | ORAL | Status: DC

## 2015-01-27 MED ORDER — PREDNISONE 20 MG PO TABS: 60.0000 mg | ORAL_TABLET | Freq: Every day | ORAL | Status: DC

## 2015-01-27 NOTE — Discharge Instructions (Signed)
Bronchitis is usually due to a virus. However, given how long you have had your symptoms, I will prescribe antibiotics (Azithromycin aka a Z-pack) for you tonight. I will also give you steroids to take for the next 3 days. Use the albuterol inhaler only as needed for shortness of breath. As we discussed, return to the ER for high fever, chest pain, new or worsening symptoms.   Please obtain all of your results from medical records or have your doctors office obtain the results - share them with your doctor - you should be seen at your doctors office in the next 2 days. Call today to arrange your follow up. Take the medications as prescribed. Please review all of the medicines and only take them if you do not have an allergy to them. Please be aware that if you are taking birth control pills, taking other prescriptions, ESPECIALLY ANTIBIOTICS may make the birth control ineffective - if this is the case, either do not engage in sexual activity or use alternative methods of birth control such as condoms until you have finished the medicine and your family doctor says it is OK to restart them. If you are on a blood thinner such as COUMADIN, be aware that any other medicine that you take may cause the coumadin to either work too much, or not enough - you should have your coumadin level rechecked in next 7 days if this is the case.  ?  It is also a possibility that you have an allergic reaction to any of the medicines that you have been prescribed - Everybody reacts differently to medications and while MOST people have no trouble with most medicines, you may have a reaction such as nausea, vomiting, rash, swelling, shortness of breath. If this is the case, please stop taking the medicine immediately and contact your physician.  ?  You should return to the ER if you develop severe or worsening symptoms.    How to Use an Inhaler Proper inhaler technique is very important. Good technique ensures that the medicine  reaches the lungs. Poor technique results in depositing the medicine on the tongue and back of the throat rather than in the airways. If you do not use the inhaler with good technique, the medicine will not help you. STEPS TO FOLLOW IF USING AN INHALER WITHOUT AN EXTENSION TUBE 1. Remove the cap from the inhaler. 2. If you are using the inhaler for the first time, you will need to prime it. Shake the inhaler for 5 seconds and release four puffs into the air, away from your face. Ask your health care provider or pharmacist if you have questions about priming your inhaler. 3. Shake the inhaler for 5 seconds before each breath in (inhalation). 4. Position the inhaler so that the top of the canister faces up. 5. Put your index finger on the top of the medicine canister. Your thumb supports the bottom of the inhaler. 6. Open your mouth. 7. Either place the inhaler between your teeth and place your lips tightly around the mouthpiece, or hold the inhaler 1-2 inches away from your open mouth. If you are unsure of which technique to use, ask your health care provider. 8. Breathe out (exhale) normally and as completely as possible. 9. Press the canister down with your index finger to release the medicine. 10. At the same time as the canister is pressed, inhale deeply and slowly until your lungs are completely filled. This should take 4-6 seconds. Keep your tongue  down. 11. Hold the medicine in your lungs for 5-10 seconds (10 seconds is best). This helps the medicine get into the small airways of your lungs. 12. Breathe out slowly, through pursed lips. Whistling is an example of pursed lips. 13. Wait at least 15-30 seconds between puffs. Continue with the above steps until you have taken the number of puffs your health care provider has ordered. Do not use the inhaler more than your health care provider tells you. 14. Replace the cap on the inhaler. 15. Follow the directions from your health care provider or  the inhaler insert for cleaning the inhaler. STEPS TO FOLLOW IF USING AN INHALER WITH AN EXTENSION (SPACER) 1. Remove the cap from the inhaler. 2. If you are using the inhaler for the first time, you will need to prime it. Shake the inhaler for 5 seconds and release four puffs into the air, away from your face. Ask your health care provider or pharmacist if you have questions about priming your inhaler. 3. Shake the inhaler for 5 seconds before each breath in (inhalation). 4. Place the open end of the spacer onto the mouthpiece of the inhaler. 5. Position the inhaler so that the top of the canister faces up and the spacer mouthpiece faces you. 6. Put your index finger on the top of the medicine canister. Your thumb supports the bottom of the inhaler and the spacer. 7. Breathe out (exhale) normally and as completely as possible. 8. Immediately after exhaling, place the spacer between your teeth and into your mouth. Close your lips tightly around the spacer. 9. Press the canister down with your index finger to release the medicine. 10. At the same time as the canister is pressed, inhale deeply and slowly until your lungs are completely filled. This should take 4-6 seconds. Keep your tongue down and out of the way. 11. Hold the medicine in your lungs for 5-10 seconds (10 seconds is best). This helps the medicine get into the small airways of your lungs. Exhale. 12. Repeat inhaling deeply through the spacer mouthpiece. Again hold that breath for up to 10 seconds (10 seconds is best). Exhale slowly. If it is difficult to take this second deep breath through the spacer, breathe normally several times through the spacer. Remove the spacer from your mouth. 13. Wait at least 15-30 seconds between puffs. Continue with the above steps until you have taken the number of puffs your health care provider has ordered. Do not use the inhaler more than your health care provider tells you. 14. Remove the spacer from the  inhaler, and place the cap on the inhaler. 15. Follow the directions from your health care provider or the inhaler insert for cleaning the inhaler and spacer. If you are using different kinds of inhalers, use your quick relief medicine to open the airways 10-15 minutes before using a steroid if instructed to do so by your health care provider. If you are unsure which inhalers to use and the order of using them, ask your health care provider, nurse, or respiratory therapist. If you are using a steroid inhaler, always rinse your mouth with water after your last puff, then gargle and spit out the water. Do not swallow the water. AVOID:  Inhaling before or after starting the spray of medicine. It takes practice to coordinate your breathing with triggering the spray.  Inhaling through the nose (rather than the mouth) when triggering the spray. HOW TO DETERMINE IF YOUR INHALER IS FULL OR NEARLY EMPTY  You cannot know when an inhaler is empty by shaking it. A few inhalers are now being made with dose counters. Ask your health care provider for a prescription that has a dose counter if you feel you need that extra help. If your inhaler does not have a counter, ask your health care provider to help you determine the date you need to refill your inhaler. Write the refill date on a calendar or your inhaler canister. Refill your inhaler 7-10 days before it runs out. Be sure to keep an adequate supply of medicine. This includes making sure it is not expired, and that you have a spare inhaler.  SEEK MEDICAL CARE IF:   Your symptoms are only partially relieved with your inhaler.  You are having trouble using your inhaler.  You have some increase in phlegm. SEEK IMMEDIATE MEDICAL CARE IF:   You feel little or no relief with your inhalers. You are still wheezing and are feeling shortness of breath or tightness in your chest or both.  You have dizziness, headaches, or a fast heart rate.  You have chills, fever,  or night sweats.  You have a noticeable increase in phlegm production, or there is blood in the phlegm. MAKE SURE YOU:   Understand these instructions.  Will watch your condition.  Will get help right away if you are not doing well or get worse.   This information is not intended to replace advice given to you by your health care provider. Make sure you discuss any questions you have with your health care provider.   Document Released: 03/13/2000 Document Revised: 01/04/2013 Document Reviewed: 10/13/2012 [employer] Interactive Patient Education [employer]

## 2015-01-27 NOTE — ED Provider Notes (Signed)
CSN: [id number]     Arrival date & time 01/27/15  1847 History   First MD Initiated Contact with Patient 01/27/15 2225     Chief Complaint  Patient presents with  . Generalized Body Aches  . Cough  . Shortness of Breath     HPI   Robert Stokes is an 24 y.o. male with h/o asthma who presents to the ED for evaluation of cough, body aches, and SOB. Pt states he has had a cough for the past 3 weeks. Initially associated with fever, but that has resolved. He states the cough is sometimes productive and sometimes is not. He reports he is coughing so much that he cannot sleep at night and his right ribs hurt. States that sometimes he will have coughing fits that last a few minutes and then will have shortness of breath where he feels like he is gasping for air. Denies hemoptysis. Endorses diffuse muscle aches. Denies N/V/D, abdominal pain, or chest pain. Denies problems with urination. Denies feeling weak or lightheaded. Denies sick contacts. Pt did not get a flu shot yet this year. Denies EtOH, tobacco, or other drug use.   Past Medical History  Diagnosis Date  . Asthma   . Obesity    Past Surgical History  Procedure Laterality Date  . Tonsillectomy    . Adenoidectomy     History reviewed. No pertinent family history. Social History  Substance Use Topics  . Smoking status: Never Smoker   . Smokeless tobacco: None  . Alcohol Use: No    Review of Systems  All other systems reviewed and are negative.     Allergies  Review of patient's allergies indicates no known allergies.  Home Medications   Prior to Admission medications   Medication Sig Start Date End Date Taking? Authorizing Provider  NON FORMULARY Apply 1 application topically daily as needed ("essential oils").   Yes Historical Provider, MD  Pseudoeph-Doxylamine-DM-APAP (NYQUIL MULTI-SYMPTOM PO) Take 2 capsules by mouth daily as needed (for cold).   Yes Historical Provider, MD  albuterol (PROVENTIL HFA;VENTOLIN HFA) 108 (90  BASE) MCG/ACT inhaler Inhale 1 puff into the lungs every 4 (four) hours as needed for wheezing or shortness of breath. 01/27/15   Ace GinsSerena Y Sebastiana Wuest, PA-C  azithromycin (ZITHROMAX) 250 MG tablet Take 1 tablet (250 mg total) by mouth daily. Take first 2 tablets together, then 1 every day until finished. 01/27/15   Ace GinsSerena Y Jalayia Bagheri, PA-C  benzonatate (TESSALON) 100 MG capsule Take 1 capsule (100 mg total) by mouth every 8 (eight) hours. 01/27/15   Ace GinsSerena Y Bodhi Stenglein, PA-C  ibuprofen (ADVIL,MOTRIN) 800 MG tablet Take 1 tablet (800 mg total) by mouth 3 (three) times daily. Patient not taking: Reported on 01/27/2015 01/03/15   Eber HongBrian Miller, MD  naproxen (NAPROSYN) 250 MG tablet Take 1 tablet (250 mg total) by mouth 2 (two) times daily with a meal. Patient not taking: Reported on 01/27/2015 11/28/14   Everlene FarrierWilliam Dansie, PA-C  predniSONE (DELTASONE) 20 MG tablet Take 3 tablets (60 mg total) by mouth daily. 01/27/15   Ace GinsSerena Y Atul Delucia, PA-C   BP 118/80 mmHg  Pulse 86  Temp(Src) 98.2 F (36.8 C) (Oral)  Resp 22  Ht 6' (1.829 m)  Wt 325 lb (147.419 kg)  BMI 44.07 kg/m2  SpO2 98% Physical Exam  Constitutional: He is oriented to person, place, and time.  Obese, coughing, NAD  HENT:  Right Ear: External ear normal.  Left Ear: External ear normal.  Nose: Nose normal.  Mouth/Throat: Oropharynx is clear and moist. No oropharyngeal exudate.  Mild posterior oropharyngeal erythema  Eyes: Conjunctivae and EOM are normal. Pupils are equal, round, and reactive to light.  Neck: Normal range of motion. Neck supple.  Cardiovascular: Normal rate, regular rhythm, normal heart sounds and intact distal pulses.   No murmur heard. Pulmonary/Chest: Effort normal and breath sounds normal. No stridor. No respiratory distress. He has no wheezes. He has no rales. He exhibits no tenderness.  Abdominal: Soft. Bowel sounds are normal. He exhibits no distension. There is no tenderness.  Musculoskeletal: Normal range of motion. He exhibits no edema  or tenderness.  Lymphadenopathy:    He has no cervical adenopathy.  Neurological: He is alert and oriented to person, place, and time. No cranial nerve deficit.  Skin: Skin is warm and dry. No rash noted. No erythema. No pallor.  Nursing note and vitals reviewed.   ED Course  Procedures (including critical care time) Labs Review Labs Reviewed - No data to display  Imaging Review Dg Chest 2 View  01/27/2015  CLINICAL DATA:  Flu-like symptoms for 3 weeks. Worsening chest congestion and cough, no fever. History of asthma. EXAM: CHEST  2 VIEW COMPARISON:  Chest radiograph February 13, 2014 FINDINGS: Cardiomediastinal silhouette is normal. The lungs are clear without pleural effusions or focal consolidations. Trachea projects midline and there is no pneumothorax. Soft tissue planes and included osseous structures are non-suspicious. Large body habitus. IMPRESSION: Normal chest. Electronically Signed   By: Awilda Metro M.D.   On: 01/27/2015 21:39   I have personally reviewed and evaluated these images and lab results as part of my medical decision-making.   EKG Interpretation None      MDM   Final diagnoses:  Bronchitis    CXR shows no acute processes. Pt is coughing in the room but not in respiratory distress, not working hard to breathe. I do not hear any wheezes, crackles, or rhonchi on exam. He is afebrile, normotensive, not tachycardic or tachypneic. I do not see any indication for further lab workup at this time. Given duration of symptoms will give pt a z-pack. Will also send home with prednisone, albuterol, and tessalon. Return precautions given.   Carlene Coria, PA-C 01/27/15 2340  Margarita Grizzle, MD 01/31/15 [phone]

## 2015-01-27 NOTE — ED Notes (Signed)
Pt states three weeks ago started experiencing flu-like symptoms. Took Nyquil for symptoms. Fever improved but the chest congestion has got worse. Pt has a cough with shortness of breath and chest congestion. Denies fever.

## 2015-01-31 DIAGNOSIS — Z13220 Encounter for screening for lipoid disorders: Secondary | ICD-10-CM | POA: Diagnosis not present

## 2015-01-31 DIAGNOSIS — Z131 Encounter for screening for diabetes mellitus: Secondary | ICD-10-CM | POA: Diagnosis not present

## 2015-01-31 DIAGNOSIS — M545 Low back pain: Secondary | ICD-10-CM | POA: Diagnosis not present

## 2015-01-31 DIAGNOSIS — F902 Attention-deficit hyperactivity disorder, combined type: Secondary | ICD-10-CM | POA: Diagnosis not present

## 2015-01-31 DIAGNOSIS — Z6841 Body Mass Index (BMI) 40.0 and over, adult: Secondary | ICD-10-CM | POA: Diagnosis not present

## 2015-05-02 DIAGNOSIS — M545 Low back pain: Secondary | ICD-10-CM | POA: Diagnosis not present

## 2015-05-02 DIAGNOSIS — Z6841 Body Mass Index (BMI) 40.0 and over, adult: Secondary | ICD-10-CM | POA: Diagnosis not present

## 2015-06-22 ENCOUNTER — Emergency Department (HOSPITAL_COMMUNITY): Payer: Medicare Other

## 2015-06-22 ENCOUNTER — Emergency Department (HOSPITAL_COMMUNITY)
Admission: EM | Admit: 2015-06-22 | Discharge: 2015-06-22 | Disposition: A | Discharge status: Home / Self Care | Payer: Medicare Other | Attending: Emergency Medicine | Admitting: Emergency Medicine

## 2015-06-22 ENCOUNTER — Encounter (HOSPITAL_COMMUNITY): Payer: Self-pay

## 2015-06-22 DIAGNOSIS — E669 Obesity, unspecified: Secondary | ICD-10-CM | POA: Diagnosis not present

## 2015-06-22 DIAGNOSIS — J45909 Unspecified asthma, uncomplicated: Secondary | ICD-10-CM | POA: Diagnosis not present

## 2015-06-22 DIAGNOSIS — Y9389 Activity, other specified: Secondary | ICD-10-CM | POA: Insufficient documentation

## 2015-06-22 DIAGNOSIS — Y998 Other external cause status: Secondary | ICD-10-CM | POA: Insufficient documentation

## 2015-06-22 DIAGNOSIS — W208XXA Other cause of strike by thrown, projected or falling object, initial encounter: Secondary | ICD-10-CM | POA: Diagnosis not present

## 2015-06-22 DIAGNOSIS — M79662 Pain in left lower leg: Secondary | ICD-10-CM | POA: Diagnosis not present

## 2015-06-22 DIAGNOSIS — M7989 Other specified soft tissue disorders: Secondary | ICD-10-CM | POA: Diagnosis not present

## 2015-06-22 DIAGNOSIS — S8012XA Contusion of left lower leg, initial encounter: Secondary | ICD-10-CM | POA: Diagnosis not present

## 2015-06-22 DIAGNOSIS — Y9289 Other specified places as the place of occurrence of the external cause: Secondary | ICD-10-CM | POA: Diagnosis not present

## 2015-06-22 DIAGNOSIS — Z79899 Other long term (current) drug therapy: Secondary | ICD-10-CM | POA: Diagnosis not present

## 2015-06-22 DIAGNOSIS — S8992XA Unspecified injury of left lower leg, initial encounter: Secondary | ICD-10-CM | POA: Diagnosis present

## 2015-06-22 DIAGNOSIS — Z7952 Long term (current) use of systemic steroids: Secondary | ICD-10-CM | POA: Diagnosis not present

## 2015-06-22 NOTE — Discharge Instructions (Signed)
Contusion A contusion is a deep bruise. Contusions are the result of a blunt injury to tissues and muscle fibers under the skin. The injury causes bleeding under the skin. The skin overlying the contusion may turn blue, purple, or yellow. Minor injuries will give you a painless contusion, but more severe contusions may stay painful and swollen for a few weeks.  CAUSES  This condition is usually caused by a blow, trauma, or direct force to an area of the body. SYMPTOMS  Symptoms of this condition include:  Swelling of the injured area.  Pain and tenderness in the injured area.  Discoloration. The area may have redness and then turn blue, purple, or yellow. DIAGNOSIS  This condition is diagnosed based on a physical exam and medical history. An X-ray, CT scan, or MRI may be needed to determine if there are any associated injuries, such as broken bones (fractures). TREATMENT  Specific treatment for this condition depends on what area of the body was injured. In general, the best treatment for a contusion is resting, icing, applying pressure to (compression), and elevating the injured area. This is often called the RICE strategy. Over-the-counter anti-inflammatory medicines may also be recommended for pain control.  HOME CARE INSTRUCTIONS   Rest the injured area.  If directed, apply ice to the injured area:  Put ice in a plastic bag.  Place a towel between your skin and the bag.  Leave the ice on for 20 minutes, 2-3 times per day.  If directed, apply light compression to the injured area using an elastic bandage. Make sure the bandage is not wrapped too tightly. Remove and reapply the bandage as directed by your health care provider.  If possible, raise (elevate) the injured area above the level of your heart while you are sitting or lying down.  Take over-the-counter and prescription medicines only as told by your health care provider. SEEK MEDICAL CARE IF:  Your symptoms do not  improve after several days of treatment.  Your symptoms get worse.  You have difficulty moving the injured area. SEEK IMMEDIATE MEDICAL CARE IF:   You have severe pain.  You have numbness in a hand or foot.  Your hand or foot turns pale or cold.   This information is not intended to replace advice given to you by your health care provider. Make sure you discuss any questions you have with your health care provider.  Apply ice to affected area. Take ibuprofen as needed for pain. Elevate leg as much as possible. Return to the emergency department if you experience severe worsening of her symptoms, increased swelling of the leg, redness or warmth of that area, numbness or tingling in her extremity. Otherwise, follow-up with orthopedic provider or primary care doctor.

## 2015-06-22 NOTE — ED Notes (Signed)
Pt. Was at the gym and a 50 lb weight fell into his lt. Shin area.  The shin is bruised , swollen .  His lt. Ankle is also swollen and bruised.  Pt. Reports that the pain has increased in the last few days.

## 2015-06-22 NOTE — ED Notes (Signed)
Declined W/C at D/C and was escorted to lobby by RN. 

## 2015-06-22 NOTE — ED Provider Notes (Signed)
CSN: [id number]     Arrival date & time 06/22/15  1527 History  By signing my name below, I, Emmanuella Mensah, attest that this documentation has been prepared under the direction and in the presence of AvayaSamantha Mauro Arps, PA-C. Electronically Signed: Angelene GiovanniEmmanuella Mensah, ED Scribe. 06/22/2015. 5:45 PM.    Chief Complaint  Patient presents with  . Leg Injury   The history is provided by the patient. No language interpreter was used.   HPI Comments: Robert Stokes is a 25 y.o. male with a hx of asthma who presents to the Emergency Department complaining of gradually worsening constant left leg pain s/p leg injury that occurred 3 days ago. He reports associated bruising and swelling to his left shin above his ankle, swollen left foot, intermittent numbness to his left toes, and that he is only able to ambulate on the ball of his foot due to the pain. He explains that he was weight training 3 days ago when a 50 lb weight fell on his left leg (above his left ankle) as he was standing, trying to lift the weight above his head.  No alleviating factors noted. Pt has not tried any medications PTA. No weakness or open wounds.   Past Medical History  Diagnosis Date  . Asthma   . Obesity    Past Surgical History  Procedure Laterality Date  . Tonsillectomy    . Adenoidectomy     No family history on file. Social History  Substance Use Topics  . Smoking status: Never Smoker   . Smokeless tobacco: None  . Alcohol Use: No    Review of Systems  All other systems reviewed and are negative.     Allergies  Review of patient's allergies indicates no known allergies.  Home Medications   Prior to Admission medications   Medication Sig Start Date End Date Taking? Authorizing Provider  albuterol (PROVENTIL HFA;VENTOLIN HFA) 108 (90 BASE) MCG/ACT inhaler Inhale 1 puff into the lungs every 4 (four) hours as needed for wheezing or shortness of breath. 01/27/15   Ace GinsSerena Y Sam, PA-C  azithromycin  (ZITHROMAX) 250 MG tablet Take 1 tablet (250 mg total) by mouth daily. Take first 2 tablets together, then 1 every day until finished. 01/27/15   Ace GinsSerena Y Sam, PA-C  benzonatate (TESSALON) 100 MG capsule Take 1 capsule (100 mg total) by mouth every 8 (eight) hours. 01/27/15   Ace GinsSerena Y Sam, PA-C  ibuprofen (ADVIL,MOTRIN) 800 MG tablet Take 1 tablet (800 mg total) by mouth 3 (three) times daily. Patient not taking: Reported on 01/27/2015 01/03/15   Eber HongBrian Miller, MD  naproxen (NAPROSYN) 250 MG tablet Take 1 tablet (250 mg total) by mouth 2 (two) times daily with a meal. Patient not taking: Reported on 01/27/2015 11/28/14   Everlene FarrierWilliam Dansie, PA-C  NON FORMULARY Apply 1 application topically daily as needed ("essential oils").    Historical Provider, MD  predniSONE (DELTASONE) 20 MG tablet Take 3 tablets (60 mg total) by mouth daily. 01/27/15   Ace GinsSerena Y Sam, PA-C  Pseudoeph-Doxylamine-DM-APAP (NYQUIL MULTI-SYMPTOM PO) Take 2 capsules by mouth daily as needed (for cold).    Historical Provider, MD   BP 135/87 mmHg  Pulse 79  Temp(Src) 97.5 F (36.4 C) (Oral)  Resp 20  Wt 367 lb 11.2 oz (166.788 kg)  SpO2 97% Physical Exam  Constitutional: He is oriented to person, place, and time. He appears well-developed and well-nourished. No distress.  HENT:  Head: Normocephalic and atraumatic.  Eyes: Conjunctivae are normal.  Right eye exhibits no discharge. Left eye exhibits no discharge. No scleral icterus.  Cardiovascular: Normal rate.   Pulmonary/Chest: Effort normal.  Musculoskeletal:  Significant ecchymosis and edema over anterior aspect of left lower shin and medial malleolus.  Pain felt with dorsiflexion of left foot, no obvious bony deformity Intact distal pulses.   Neurological: He is alert and oriented to person, place, and time. Coordination normal.  Skin: Skin is warm and dry. No rash noted. He is not diaphoretic. No erythema. No pallor.  Psychiatric: He has a normal mood and affect. His behavior  is normal.  Nursing note and vitals reviewed.   ED Course  Procedures (including critical care time) DIAGNOSTIC STUDIES: Oxygen Saturation is 97% on RA, normal by my interpretation.    COORDINATION OF CARE: 4:34 PM- Pt advised of plan for treatment and pt agrees. Pt will receive x-ray for further evaluation.   Imaging Review Dg Tibia/fibula Left  06/22/2015  CLINICAL DATA:  Dropped heavy object on leg.  Pain. EXAM: LEFT TIBIA AND FIBULA - 2 VIEW COMPARISON:  None. FINDINGS: There is no evidence of fracture or other focal bone lesions. Marked soft tissue swelling. IMPRESSION: Negative for fracture. Electronically Signed   By: Elsie Stain M.D.   On: 06/22/2015 17:35   Dg Ankle Complete Left  06/22/2015  CLINICAL DATA:  Dropped weight on leg, large hematoma EXAM: LEFT ANKLE COMPLETE - 3+ VIEW COMPARISON:  None. FINDINGS: No fracture or dislocation is seen. The ankle mortise is intact. The base of the fifth metatarsal is unremarkable. Moderate to severe diffuse soft tissue swelling along the ankle. IMPRESSION: No fracture or dislocation is seen. Moderate to severe diffuse soft tissue swelling. Electronically Signed   By: Charline Bills M.D.   On: 06/22/2015 17:39     Gaylyn Rong, PA-C has personally reviewed and evaluated these images as part of her medical decision-making.  MDM   Final diagnoses:  Contusion of leg, left, initial encounter  Patient X-Ray negative for obvious fracture or dislocation. Obvious bruising noted to left lower extremity. Pt advised to follow up with orthopedics if symptoms persist for possibility of missed fracture diagnosis. Patient given brace while in ED, conservative therapy recommended and discussed. Patient will be dc home & is agreeable with above plan.     I personally performed the services described in this documentation, which was scribed in my presence. The recorded information has been reviewed and is accurate.     Lester Kinsman [place]ee,  PA-C 06/22/15 1822  Benjiman Core, MD 06/22/15 [phone]

## 2017-12-06 ENCOUNTER — Ambulatory Visit (HOSPITAL_COMMUNITY)
Admission: EM | Admit: 2017-12-06 | Discharge: 2017-12-06 | Disposition: A | Discharge status: Home / Self Care | Payer: Medicare Other | Attending: Family Medicine | Admitting: Family Medicine

## 2017-12-06 ENCOUNTER — Encounter (HOSPITAL_COMMUNITY): Payer: Self-pay

## 2017-12-06 ENCOUNTER — Other Ambulatory Visit: Payer: Self-pay

## 2017-12-06 ENCOUNTER — Ambulatory Visit (INDEPENDENT_AMBULATORY_CARE_PROVIDER_SITE_OTHER): Payer: Medicare Other

## 2017-12-06 DIAGNOSIS — M25562 Pain in left knee: Secondary | ICD-10-CM

## 2017-12-06 MED ORDER — IBUPROFEN 800 MG PO TABS: 800.0000 mg | ORAL_TABLET | Freq: Three times a day (TID) | ORAL | 0 refills | Status: DC

## 2017-12-06 NOTE — ED Provider Notes (Signed)
[facility]    CSN: [id number] Arrival date & time: 12/06/17  1348     History   Chief Complaint Chief Complaint  Patient presents with  . Ankle Pain    HPI Robert Stokes is a 27 y.o. male.   HPI  Patient states he has "severe" the pain in his left knee.  Inability to straighten knee.  Inability to bear weight.  He comes into the clinic with dramatic limping putting weight only on the ball of his left foot.  States he had no injury.  He had no overuse.  No shift.  When he woke up the next morning his knee was painful.  Now he cannot flex it, extend it, put weight on it.  No swelling.  He has not taken any medicines.  He has not used any ice or heat.  He is here for evaluation.  He is requesting an x-ray.  He has told me that he is never had any problems with his left knee before.  No underlying arthritis.  No prior injury. There are some inconsistencies in his story.  He was observed walking in the parking lot without limp.  When he came into the building he had a limp.  He states he cannot put any weight on his left leg.  When he was in the x-ray department he did step full weight onto his left leg to get up onto the exam table.  He told me he had no injury.  He told the x-ray technician he had a twisting injury and felt a pop.  Not claiming a Worker's Comp. injury.  Past Medical History:  Diagnosis Date  . Asthma   . Obesity     There are no active problems to display for this patient.   Past Surgical History:  Procedure Laterality Date  . ADENOIDECTOMY    . TONSILLECTOMY         Home Medications    Prior to Admission medications   Medication Sig Start Date End Date Taking? Authorizing Provider  ibuprofen (ADVIL,MOTRIN) 800 MG tablet Take 1 tablet (800 mg total) by mouth 3 (three) times daily. 12/06/17   Eustace Moore, MD  NON FORMULARY Apply 1 application topically daily as needed ("essential oils").    [provider]    Family  History History reviewed. No pertinent family history.  Social History Social History   Tobacco Use  . Smoking status: Never Smoker  . Smokeless tobacco: Never Used  Substance Use Topics  . Alcohol use: No  . Drug use: No     Allergies   Patient has no known allergies.   Review of Systems Review of Systems  Constitutional: Negative for chills and fever.  HENT: Negative for ear pain and sore throat.   Eyes: Negative for pain and visual disturbance.  Respiratory: Negative for cough and shortness of breath.   Cardiovascular: Negative for chest pain and palpitations.  Gastrointestinal: Negative for abdominal pain and vomiting.  Genitourinary: Negative for dysuria and hematuria.  Musculoskeletal: Positive for arthralgias and gait problem. Negative for back pain.  Skin: Negative for color change and rash.  Neurological: Negative for seizures and syncope.  All other systems reviewed and are negative.    Physical Exam Triage Vital Signs ED Triage Vitals  Enc Vitals Group     BP 12/06/17 1427 (!) 145/81     Pulse Rate 12/06/17 1427 68     Resp 12/06/17 1427 20  Temp 12/06/17 1427 97.6 F (36.4 C)     Temp src --      SpO2 12/06/17 1427 98 %     Weight 12/06/17 1425 300 lb (136.1 kg)   No data found.  Updated Vital Signs BP (!) 145/81   Pulse 68   Temp 97.6 F (36.4 C)   Resp 20   Wt 136.1 kg   SpO2 98%   BMI 40.69 kg/m       Physical Exam  Constitutional: He appears well-developed and well-nourished. No distress.  Peers uncomfortable.  Is sitting with left leg partially stented, will not extend or flex to 90 degrees.  No visible bruising or swelling.  No palpable effusion.  Patient has a dramatic facial grimacing and vocalization with any palpation of the medial joint line, lateral joint line,  Anterior knee, or posterior knee.  Unable to do accurate ligament testing because of his inability/unwillingness to cooperate.  HENT:  Head: Normocephalic and  atraumatic.  Mouth/Throat: Oropharynx is clear and moist.  Eyes: Pupils are equal, round, and reactive to light. Conjunctivae are normal.  Neck: Normal range of motion.  Cardiovascular: Normal rate.  Pulmonary/Chest: Effort normal. No respiratory distress.  Abdominal: Soft. He exhibits no distension.  Musculoskeletal: Normal range of motion. He exhibits no edema.  Neurological: He is alert.  Skin: Skin is warm and dry.  Psychiatric:  Symptoms outweigh physical exam findings.  Evidence of symptom magnification     UC Treatments / Results  Labs (all labs ordered are listed, but only abnormal results are displayed) Labs Reviewed - No data to display  EKG None  Radiology Dg Knee Ap/lat W/sunrise Left  Result Date: 12/06/2017 CLINICAL DATA:  Left knee pain, no known injury, initial encounter EXAM: LEFT KNEE 3 VIEWS COMPARISON:  None. FINDINGS: No evidence of fracture, dislocation, or joint effusion. No evidence of arthropathy or other focal bone abnormality. Soft tissues are unremarkable. IMPRESSION: No acute abnormality noted. Electronically Signed   By: Alcide Clever M.D.   On: 12/06/2017 15:56    Procedures Procedures (including critical care time)  Medications Ordered in UC Medications - No data to display  Initial Impression / Assessment and Plan / UC Course  I have reviewed the triage vital signs and the nursing notes.  Pertinent labs & imaging results that were available during my care of the patient were reviewed by me and considered in my medical decision making (see chart for details).     Patient negative.  They are compared with prior x-rays of the same day (old injury documented) Final Clinical Impressions(s) / UC Diagnoses   Final diagnoses:  Acute pain of left knee     Discharge Instructions     Ice Rest Ibuprofen 3 times a day with food See orthopedist if not improving by the end of the week   ED Prescriptions    Medication Sig Dispense Auth.  Provider   ibuprofen (ADVIL,MOTRIN) 800 MG tablet Take 1 tablet (800 mg total) by mouth 3 (three) times daily. 21 tablet Eustace Moore, MD     Controlled Substance Prescriptions [place] Controlled Substance Registry consulted? Not Applicable   Eustace Moore, MD 12/06/17 [phone]

## 2017-12-06 NOTE — Discharge Instructions (Addendum)
Ice Rest Ibuprofen 3 times a day with food See orthopedist if not improving by the end of the week

## 2017-12-06 NOTE — ED Triage Notes (Signed)
Pt  has pain in his left knee. X 4 days

## 2017-12-21 DIAGNOSIS — G4700 Insomnia, unspecified: Secondary | ICD-10-CM | POA: Diagnosis not present

## 2017-12-21 DIAGNOSIS — M25569 Pain in unspecified knee: Secondary | ICD-10-CM | POA: Diagnosis not present

## 2017-12-21 DIAGNOSIS — M545 Low back pain: Secondary | ICD-10-CM | POA: Diagnosis not present

## 2017-12-31 DIAGNOSIS — M25562 Pain in left knee: Secondary | ICD-10-CM | POA: Diagnosis not present

## 2019-01-03 ENCOUNTER — Other Ambulatory Visit: Payer: Self-pay

## 2019-01-03 ENCOUNTER — Emergency Department (HOSPITAL_COMMUNITY)
Admission: EM | Admit: 2019-01-03 | Discharge: 2019-01-03 | Disposition: A | Discharge status: Home / Self Care | Payer: Medicare Other | Attending: Emergency Medicine | Admitting: Emergency Medicine

## 2019-01-03 ENCOUNTER — Encounter (HOSPITAL_COMMUNITY): Payer: Self-pay | Admitting: Emergency Medicine

## 2019-01-03 ENCOUNTER — Emergency Department (HOSPITAL_COMMUNITY): Payer: Medicare Other

## 2019-01-03 DIAGNOSIS — M7989 Other specified soft tissue disorders: Secondary | ICD-10-CM | POA: Diagnosis not present

## 2019-01-03 DIAGNOSIS — J45909 Unspecified asthma, uncomplicated: Secondary | ICD-10-CM | POA: Insufficient documentation

## 2019-01-03 DIAGNOSIS — M79671 Pain in right foot: Secondary | ICD-10-CM

## 2019-01-03 DIAGNOSIS — M79672 Pain in left foot: Secondary | ICD-10-CM

## 2019-01-03 DIAGNOSIS — M2142 Flat foot [pes planus] (acquired), left foot: Secondary | ICD-10-CM | POA: Diagnosis not present

## 2019-01-03 DIAGNOSIS — M2141 Flat foot [pes planus] (acquired), right foot: Secondary | ICD-10-CM

## 2019-01-03 MED ORDER — NAPROXEN 375 MG PO TABS: 375.0000 mg | ORAL_TABLET | Freq: Two times a day (BID) | ORAL | 0 refills | Status: DC

## 2019-01-03 NOTE — ED Triage Notes (Signed)
Pt reports he just started a job and is on his feet for many hours.  Now has pain to the bottom of his feet and lower legs, he reports swelling as well.  Was able to walk into triage.

## 2019-01-03 NOTE — ED Notes (Signed)
Patient transported to X-ray 

## 2019-01-03 NOTE — Discharge Instructions (Signed)
Limit walking. Keep feet/legs elevated to decrease swelling. NSAID as directed. Replace orthotics as soon as able. Work note for 2 days. Follow up with your care provider.

## 2019-01-03 NOTE — ED Provider Notes (Signed)
[facility] EMERGENCY DEPARTMENT Provider Note   CSN: [id number] Arrival date & time: 01/03/19  1953     History   Chief Complaint Chief Complaint  Patient presents with  . Foot Swelling    HPI [name] is a 28 y.o. male.     Patient presents with bilateral foot pain, left greater than right. Patient states he has just started a new job, and is on his feet for most of his 8 hour shift. History of pes planus, is supposed to use orthotics in his shoes. He states that his dog has destroyed his inserts, and he has not been able to see his podiatrist for replacements. He is complaining of primary discomfort along the instep of the left foot, with swelling and difficulty ambulating.   Foot Injury Location:  Foot Injury: no   Foot location:  L foot Pain details:    Quality:  Throbbing and aching   Radiates to:  Does not radiate   Severity:  Moderate   Onset quality:  Gradual   Duration:  4 days   Timing:  Constant   Progression:  Worsening Risk factors: obesity     Past Medical History:  Diagnosis Date  . Asthma   . Obesity     There are no active problems to display for this patient.   Past Surgical History:  Procedure Laterality Date  . ADENOIDECTOMY    . TONSILLECTOMY          Home Medications    Prior to Admission medications   Medication Sig Start Date End Date Taking? Authorizing Provider  ibuprofen (ADVIL,MOTRIN) 800 MG tablet Take 1 tablet (800 mg total) by mouth 3 (three) times daily. 12/06/17   Robert Moore, MD  NON FORMULARY Apply 1 application topically daily as needed ("essential oils").    [provider]    Family History No family history on file.  Social History Social History   Tobacco Use  . Smoking status: Never Smoker  . Smokeless tobacco: Never Used  Substance Use Topics  . Alcohol use: No  . Drug use: No     Allergies   Patient has no known allergies.   Review of Systems Review of  Systems  Musculoskeletal: Positive for arthralgias and myalgias.  All other systems reviewed and are negative.    Physical Exam Updated Vital Signs BP (!) 151/80   Pulse 75   Temp 98.3 F (36.8 C) (Oral)   Resp (!) 22   Ht 6' (1.829 m)   Wt 136.1 kg   SpO2 98%   BMI 40.69 kg/m   Physical Exam Vitals signs and nursing note reviewed.  Constitutional:      Appearance: He is obese.  HENT:     Head: Atraumatic.     Mouth/Throat:     Mouth: Mucous membranes are moist.  Eyes:     Conjunctiva/sclera: Conjunctivae normal.  Neck:     Musculoskeletal: Normal range of motion and neck supple.  Cardiovascular:     Rate and Rhythm: Normal rate and regular rhythm.  Pulmonary:     Effort: Pulmonary effort is normal.     Breath sounds: Normal breath sounds.  Musculoskeletal: Normal range of motion.        General: Swelling and tenderness present.     Left foot: Tenderness and swelling present. No deformity.       Feet:  Skin:    General: Skin is warm and dry.  Neurological:  Mental Status: He is alert and oriented to person, place, and time.  Psychiatric:        Mood and Affect: Mood normal.      ED Treatments / Results  Labs (all labs ordered are listed, but only abnormal results are displayed) Labs Reviewed - No data to display  EKG None  Radiology Dg Foot Complete Left  Result Date: 01/03/2019 CLINICAL DATA:  Swelling and pain of the left foot.  No trauma. EXAM: LEFT FOOT - COMPLETE 3+ VIEW COMPARISON:  None. FINDINGS: There is no evidence of fracture or dislocation. There is no evidence of arthropathy or other focal bone abnormality. Soft tissues are unremarkable. IMPRESSION: Negative. Electronically Signed   By: Robert Stokes M.D.   On: 01/03/2019 21:27    Procedures Procedures (including critical care time)  Medications Ordered in ED Medications - No data to display   Initial Impression / Assessment and Plan / ED Course  I have reviewed the triage vital  signs and the nursing notes.  Pertinent labs & imaging results that were available during my care of the patient were reviewed by me and considered in my medical decision making (see chart for details).        Patient with bilateral foot pain. History of pes planus. Patient is overweight. Not diabetic. No indication of infection. Patient X-Ray negative for obvious fracture or bony abnormality.  Pt advised to follow up with his podiatrist for replacement of orthotics.. Conservative therapy recommended and discussed. Work note for 2 days. Patient will be discharged home & is agreeable with above plan. Returns precautions discussed. Pt appears safe for discharge.  Final Clinical Impressions(s) / ED Diagnoses   Final diagnoses:  Pain in both feet  Pes planus of both feet    ED Discharge Orders         Ordered    naproxen (NAPROSYN) 375 MG tablet  2 times daily     01/03/19 2130           Etta Quill, NP 01/03/19 2138    Maudie Flakes, MD 01/03/19 2249

## 2019-01-03 NOTE — ED Notes (Signed)
Patient Alert and oriented to baseline. Stable and ambulatory to baseline. Patient verbalized understanding of the discharge instructions.  Patient belongings were taken by the patient.   

## 2019-01-10 ENCOUNTER — Other Ambulatory Visit: Payer: Self-pay

## 2019-01-10 DIAGNOSIS — J45909 Unspecified asthma, uncomplicated: Secondary | ICD-10-CM | POA: Insufficient documentation

## 2019-01-10 DIAGNOSIS — M7989 Other specified soft tissue disorders: Secondary | ICD-10-CM | POA: Diagnosis not present

## 2019-01-10 DIAGNOSIS — Y9289 Other specified places as the place of occurrence of the external cause: Secondary | ICD-10-CM | POA: Insufficient documentation

## 2019-01-10 DIAGNOSIS — Y939 Activity, unspecified: Secondary | ICD-10-CM | POA: Insufficient documentation

## 2019-01-10 DIAGNOSIS — S93402A Sprain of unspecified ligament of left ankle, initial encounter: Secondary | ICD-10-CM | POA: Diagnosis not present

## 2019-01-10 DIAGNOSIS — S8392XA Sprain of unspecified site of left knee, initial encounter: Secondary | ICD-10-CM | POA: Diagnosis not present

## 2019-01-10 DIAGNOSIS — Z79899 Other long term (current) drug therapy: Secondary | ICD-10-CM | POA: Insufficient documentation

## 2019-01-10 DIAGNOSIS — W171XXA Fall into storm drain or manhole, initial encounter: Secondary | ICD-10-CM | POA: Diagnosis not present

## 2019-01-10 DIAGNOSIS — Y999 Unspecified external cause status: Secondary | ICD-10-CM | POA: Insufficient documentation

## 2019-01-10 DIAGNOSIS — S99912A Unspecified injury of left ankle, initial encounter: Secondary | ICD-10-CM | POA: Diagnosis not present

## 2019-01-10 DIAGNOSIS — M25572 Pain in left ankle and joints of left foot: Secondary | ICD-10-CM | POA: Diagnosis not present

## 2019-01-10 DIAGNOSIS — M79662 Pain in left lower leg: Secondary | ICD-10-CM | POA: Diagnosis not present

## 2019-01-11 ENCOUNTER — Emergency Department (HOSPITAL_COMMUNITY): Payer: Medicare Other

## 2019-01-11 ENCOUNTER — Encounter (HOSPITAL_COMMUNITY): Payer: Self-pay

## 2019-01-11 ENCOUNTER — Emergency Department (HOSPITAL_COMMUNITY)
Admission: EM | Admit: 2019-01-11 | Discharge: 2019-01-11 | Disposition: A | Discharge status: Home / Self Care | Payer: Medicare Other | Attending: Emergency Medicine | Admitting: Emergency Medicine

## 2019-01-11 DIAGNOSIS — S93402A Sprain of unspecified ligament of left ankle, initial encounter: Secondary | ICD-10-CM | POA: Diagnosis not present

## 2019-01-11 DIAGNOSIS — S99912A Unspecified injury of left ankle, initial encounter: Secondary | ICD-10-CM | POA: Diagnosis not present

## 2019-01-11 DIAGNOSIS — S8392XA Sprain of unspecified site of left knee, initial encounter: Secondary | ICD-10-CM

## 2019-01-11 DIAGNOSIS — M79662 Pain in left lower leg: Secondary | ICD-10-CM | POA: Diagnosis not present

## 2019-01-11 DIAGNOSIS — M25572 Pain in left ankle and joints of left foot: Secondary | ICD-10-CM | POA: Diagnosis not present

## 2019-01-11 DIAGNOSIS — M7989 Other specified soft tissue disorders: Secondary | ICD-10-CM | POA: Diagnosis not present

## 2019-01-11 MED ORDER — HYDROCODONE-ACETAMINOPHEN 5-325 MG PO TABS
2.0000 | ORAL_TABLET | Freq: Once | ORAL | Status: AC
  Administered 2019-01-11: 2 via ORAL
  Filled 2019-01-11: qty 2

## 2019-01-11 MED ORDER — IBUPROFEN 800 MG PO TABS: 800.0000 mg | ORAL_TABLET | Freq: Three times a day (TID) | ORAL | 0 refills | Status: DC

## 2019-01-11 NOTE — ED Triage Notes (Signed)
Pt slipped into a manhole because the cover was hidden, he complains of left lower leg pain and numbness

## 2019-01-11 NOTE — ED Provider Notes (Signed)
[facility]-EMERGENCY DEPT Provider Note   CSN: [id number] Arrival date & time: 01/10/19  2345     History   Chief Complaint No chief complaint on file.   HPI Robert Stokes is a 28 y.o. male.     Patient presents to the emergency department with a chief complaint of left leg pain.  He states that he tripped and fell into a manhole.  He states that he cannot see it because it was covered in grass and mud.  He states that he twisted his left ankle and knee.  He reports feeling a pop.  He states that he cannot ambulate because of pain.  He has not taken anything for his symptoms.  He reports tingling and numbness into his foot.  The history is provided by the patient. No language interpreter was used.    Past Medical History:  Diagnosis Date  . Asthma   . Obesity     There are no active problems to display for this patient.   Past Surgical History:  Procedure Laterality Date  . ADENOIDECTOMY    . TONSILLECTOMY          Home Medications    Prior to Admission medications   Medication Sig Start Date End Date Taking? Authorizing Provider  ibuprofen (ADVIL,MOTRIN) 800 MG tablet Take 1 tablet (800 mg total) by mouth 3 (three) times daily. 12/06/17   Eustace Moore, MD  naproxen (NAPROSYN) 375 MG tablet Take 1 tablet (375 mg total) by mouth 2 (two) times daily. 01/03/19   Felicie Morn, NP  NON FORMULARY Apply 1 application topically daily as needed ("essential oils").    [provider]    Family History History reviewed. No pertinent family history.  Social History Social History   Tobacco Use  . Smoking status: Never Smoker  . Smokeless tobacco: Never Used  Substance Use Topics  . Alcohol use: No  . Drug use: No     Allergies   Patient has no known allergies.   Review of Systems Review of Systems  All other systems reviewed and are negative.    Physical Exam Updated Vital Signs BP (!) 159/102 (BP Location: Left Arm)    Pulse 77   Temp 97.9 F (36.6 C) (Oral)   Resp 18   Ht 6' (1.829 m)   Wt 136.1 kg   SpO2 96%   BMI 40.69 kg/m   Physical Exam Nursing note and vitals reviewed.  Constitutional: Pt appears well-developed and well-nourished. No distress.  HENT:  Head: Normocephalic and atraumatic.  Eyes: Conjunctivae are normal.  Neck: Normal range of motion.  Cardiovascular: Normal rate, regular rhythm. Intact distal pulses.   Capillary refill < 3 sec.  Pulmonary/Chest: Effort normal and breath sounds normal.  Musculoskeletal:  LLE Pt exhibits TTP over the lateral malleolus with moderate swelling, tenderness to palpation about the left knee without significant swelling, range of motion and strength limited secondary to pain.    Neurological: Pt  is alert. Coordination normal.  Sensation: 5/5 Skin: Skin is warm and dry. Pt is not diaphoretic.  No evidence of open wound or skin tenting Psychiatric: Pt has a normal mood and affect.     ED Treatments / Results  Labs (all labs ordered are listed, but only abnormal results are displayed) Labs Reviewed - No data to display  EKG None  Radiology No results found.  Procedures Procedures (including critical care time)  Medications Ordered in ED Medications  HYDROcodone-acetaminophen (NORCO/VICODIN)  5-325 MG per tablet 2 tablet (has no administration in time range)     Initial Impression / Assessment and Plan / ED Course  I have reviewed the triage vital signs and the nursing notes.  Pertinent labs & imaging results that were available during my care of the patient were reviewed by me and considered in my medical decision making (see chart for details).        Patient presents with injury to left leg.  DDx includes, fracture, strain, or sprain.  Consultants: none  Plain films reveal no fractures or dislocations.  Pt advised to follow up with PCP and/or orthopedics. Patient given braces, crutches, NSAIDs while in ED,  conservative therapy such as RICE recommended and discussed.   Patient will be discharged home & is agreeable with above plan. Returns precautions discussed. Pt appears safe for discharge.   Final Clinical Impressions(s) / ED Diagnoses   Final diagnoses:  Sprain of left ankle, unspecified ligament, initial encounter  Sprain of left knee, unspecified ligament, initial encounter    ED Discharge Orders         Ordered    ibuprofen (ADVIL) 800 MG tablet  3 times daily     01/11/19 0340           Montine Circle, PA-C 01/11/19 0341    Palumbo, April, MD 01/11/19 [phone]

## 2019-01-12 ENCOUNTER — Ambulatory Visit: Payer: Medicare Other | Admitting: Orthopaedic Surgery

## 2019-01-13 ENCOUNTER — Other Ambulatory Visit: Payer: Self-pay

## 2019-01-13 ENCOUNTER — Ambulatory Visit (INDEPENDENT_AMBULATORY_CARE_PROVIDER_SITE_OTHER): Payer: Medicare Other | Admitting: Orthopaedic Surgery

## 2019-01-13 ENCOUNTER — Encounter: Payer: Self-pay | Admitting: Orthopaedic Surgery

## 2019-01-13 DIAGNOSIS — S93402A Sprain of unspecified ligament of left ankle, initial encounter: Secondary | ICD-10-CM

## 2019-01-13 NOTE — Progress Notes (Signed)
Office Visit Note   Patient: Robert Stokes           Date of Birth: 01/03/1991           MRN: [id number] Visit Date: 01/13/2019              Requested by: No referring provider defined for this encounter. PCP: System, Pcp Not In   Assessment & Plan: Visit Diagnoses:  1. Moderate left ankle sprain, initial encounter     Plan: Impression is severe left ankle sprain.  We will immobilized in a cam boot.  Prescription for physical therapy was provided.  Out of work for 2 weeks from [employer] as requested by the patient.  I think this is reasonable.  Patient instructed to follow-up if he does not feel any improvement.  In the meantime he should continue to ice and elevate and take NSAIDs.  Follow-Up Instructions: Return if symptoms worsen or fail to improve.   Orders:  No orders of the defined types were placed in this encounter.  No orders of the defined types were placed in this encounter.     Procedures: No procedures performed   Clinical Data: No additional findings.   Subjective: Chief Complaint  Patient presents with  . Left Ankle - Pain  . Left Leg - Pain    Robert Stokes is a 28 year old gentleman who comes in for evaluation of acute left ankle sprain.  He was walking and fell in a water meter hole.  His leg went all the way down into the hole to his thigh.  He had x-rays in the ER 2 days ago which were negative.  He states that he has intense pain with walking and he can barely put on his ASO brace.  He does endorse some numbness throughout his leg.   Review of Systems  Constitutional: Negative.   All other systems reviewed and are negative.    Objective: Vital Signs: There were no vitals taken for this visit.  Physical Exam Vitals signs and nursing note reviewed.  Constitutional:      Appearance: He is well-developed.  HENT:     Head: Normocephalic and atraumatic.  Eyes:     Pupils: Pupils are equal, round, and reactive to light.  Neck:   Musculoskeletal: Neck supple.  Pulmonary:     Effort: Pulmonary effort is normal.  Abdominal:     Palpations: Abdomen is soft.  Musculoskeletal: Normal range of motion.  Skin:    General: Skin is warm.  Neurological:     Mental Status: He is alert and oriented to person, place, and time.  Psychiatric:        Behavior: Behavior normal.        Thought Content: Thought content normal.        Judgment: Judgment normal.     Ortho Exam Left ankle exam shows moderate ecchymosis.  He has pain with palpation throughout the ankle and foot.  He has pain with movement of the ankle and subtalar joint.  No neurovascular compromise. Specialty Comments:  No specialty comments available.  Imaging: No results found.   PMFS History: Patient Active Problem List   Diagnosis Date Noted  . Moderate left ankle sprain 01/13/2019   Past Medical History:  Diagnosis Date  . Asthma   . Obesity     History reviewed. No pertinent family history.  Past Surgical History:  Procedure Laterality Date  . ADENOIDECTOMY    . TONSILLECTOMY     Social History  Occupational History  . Not on file  Tobacco Use  . Smoking status: Never Smoker  . Smokeless tobacco: Never Used  Substance and Sexual Activity  . Alcohol use: No  . Drug use: No  . Sexual activity: Not on file

## 2019-01-18 ENCOUNTER — Telehealth: Payer: Self-pay | Admitting: Orthopaedic Surgery

## 2019-01-18 NOTE — Telephone Encounter (Signed)
Patient called and left a message. Says the PT place he was referred to does not take MCD. He would like a referral sent to [facility] on [address]

## 2019-01-19 NOTE — Telephone Encounter (Signed)
Ok to send

## 2019-01-19 NOTE — Telephone Encounter (Signed)
Okay to send referral to [facility]?

## 2019-01-25 ENCOUNTER — Other Ambulatory Visit: Payer: Self-pay

## 2019-01-25 DIAGNOSIS — S93402A Sprain of unspecified ligament of left ankle, initial encounter: Secondary | ICD-10-CM

## 2019-01-25 NOTE — Telephone Encounter (Signed)
Referral made 

## 2019-07-28 ENCOUNTER — Other Ambulatory Visit: Payer: Self-pay

## 2019-07-28 ENCOUNTER — Encounter (HOSPITAL_COMMUNITY): Payer: Self-pay | Admitting: Emergency Medicine

## 2019-07-28 ENCOUNTER — Emergency Department (HOSPITAL_COMMUNITY)
Admission: EM | Admit: 2019-07-28 | Discharge: 2019-07-29 | Disposition: A | Discharge status: Home / Self Care | Payer: Medicare Other | Attending: Emergency Medicine | Admitting: Emergency Medicine

## 2019-07-28 DIAGNOSIS — W25XXXA Contact with sharp glass, initial encounter: Secondary | ICD-10-CM | POA: Diagnosis not present

## 2019-07-28 DIAGNOSIS — Z79899 Other long term (current) drug therapy: Secondary | ICD-10-CM | POA: Diagnosis not present

## 2019-07-28 DIAGNOSIS — S61210A Laceration without foreign body of right index finger without damage to nail, initial encounter: Secondary | ICD-10-CM | POA: Diagnosis not present

## 2019-07-28 DIAGNOSIS — Y990 Civilian activity done for income or pay: Secondary | ICD-10-CM | POA: Diagnosis not present

## 2019-07-28 DIAGNOSIS — Y929 Unspecified place or not applicable: Secondary | ICD-10-CM | POA: Insufficient documentation

## 2019-07-28 DIAGNOSIS — Y939 Activity, unspecified: Secondary | ICD-10-CM | POA: Diagnosis not present

## 2019-07-28 DIAGNOSIS — S6991XA Unspecified injury of right wrist, hand and finger(s), initial encounter: Secondary | ICD-10-CM | POA: Diagnosis present

## 2019-07-28 NOTE — ED Triage Notes (Signed)
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

## 2019-07-29 DIAGNOSIS — S61210A Laceration without foreign body of right index finger without damage to nail, initial encounter: Secondary | ICD-10-CM | POA: Diagnosis not present

## 2019-07-29 MED ORDER — CEPHALEXIN 500 MG PO CAPS: 500.0000 mg | ORAL_CAPSULE | Freq: Four times a day (QID) | ORAL | 0 refills | Status: AC

## 2019-07-29 NOTE — Progress Notes (Signed)
Orthopedic Tech Progress Note Patient Details:  Robert Stokes 1990-05-31 [id number]  Ortho Devices Type of Ortho Device: Finger splint Ortho Device/Splint Location: RUE Ortho Device/Splint Interventions: Application   Post Interventions Patient Tolerated: Well Instructions Provided: Care of device   [name] [name] 07/29/2019, 4:09 AM

## 2019-07-29 NOTE — ED Notes (Signed)
Have pt call [phone] for pick up when discharged

## 2019-07-29 NOTE — ED Provider Notes (Signed)
[facility] EMERGENCY DEPARTMENT Provider Note   CSN: [id number] Arrival date & time: 07/28/19  2208     History Chief Complaint  Patient presents with  . Laceration    Finger    Robert Stokes is a 29 y.o. male with history of asthma who presents the emergency department with a chief complaint of right index finger laceration.  He is right-hand dominant.  The patient reports that he cut the tip of his right index finger on a broken piece of glass earlier tonight while at work.  He reports that a skin adhesive was administered to the wound while he was at work.  He does not believe that there are any small shards of glass.  However, the wound was not irrigated prior to closure at his place of employment.  He has no numbness, weakness.  Denies fever, chills, redness, or swelling.  He has a small amount of pain that is isolated at the injury.  No history of right hand surgery.  No other treatment prior to arrival.  Tdap is up-to-date.  The history is provided by the patient. No language interpreter was used.       Past Medical History:  Diagnosis Date  . Asthma   . Obesity     Patient Active Problem List   Diagnosis Date Noted  . Moderate left ankle sprain 01/13/2019    Past Surgical History:  Procedure Laterality Date  . ADENOIDECTOMY    . TONSILLECTOMY         No family history on file.  Social History   Tobacco Use  . Smoking status: Never Smoker  . Smokeless tobacco: Never Used  Substance Use Topics  . Alcohol use: No  . Drug use: No    Home Medications Prior to Admission medications   Medication Sig Start Date End Date Taking? Authorizing Provider  ibuprofen (ADVIL) 800 MG tablet Take 1 tablet (800 mg total) by mouth 3 (three) times daily. 01/11/19   Roxy Horseman, PA-C  naproxen (NAPROSYN) 375 MG tablet Take 1 tablet (375 mg total) by mouth 2 (two) times daily. 01/03/19   Felicie Morn, NP  NON FORMULARY Apply 1 application  topically daily as needed ("essential oils").    [provider]    Allergies    Patient has no known allergies.  Review of Systems   Review of Systems  Constitutional: Negative for appetite change and fever.  Respiratory: Negative for shortness of breath.   Cardiovascular: Negative for chest pain.  Gastrointestinal: Negative for abdominal pain.  Genitourinary: Negative for dysuria.  Musculoskeletal: Negative for back pain.  Skin: Positive for wound. Negative for rash.  Allergic/Immunologic: Negative for immunocompromised state.  Neurological: Negative for dizziness, weakness, light-headedness, numbness and headaches.  Psychiatric/Behavioral: Negative for confusion.    Physical Exam Updated Vital Signs BP (!) 159/87 (BP Location: Left Arm)   Pulse 80   Temp 98 F (36.7 C) (Oral)   Resp 16   Ht 6\' 1"  (1.854 m)   Wt (!) 150 kg   SpO2 97%   BMI 43.63 kg/m   Physical Exam Vitals and nursing note reviewed.  Constitutional:      General: He is not in acute distress.    Appearance: He is well-developed. He is not ill-appearing, toxic-appearing or diaphoretic.  HENT:     Head: Normocephalic.  Eyes:     Conjunctiva/sclera: Conjunctivae normal.  Cardiovascular:     Rate and Rhythm: Normal rate and regular rhythm.  Heart sounds: No murmur.  Pulmonary:     Effort: Pulmonary effort is normal. No respiratory distress.     Breath sounds: No stridor. No wheezing, rhonchi or rales.  Chest:     Chest wall: No tenderness.  Abdominal:     General: There is no distension.     Palpations: Abdomen is soft.  Musculoskeletal:     Cervical back: Neck supple.  Skin:    General: Skin is warm and dry.     Comments: There is a 0.3 cm wound noted to the distal tip of the right index finger.  There is an adhesive overlying the wound.  Sensation is intact to the 4 distal tips of the right index finger.  Good capillary refill.  No erythema, warmth, drainage.  Wound is hemostatic.   No obvious foreign bodies.  Full active and passive range of motion of the entire digit.  Neurological:     Mental Status: He is alert.  Psychiatric:        Behavior: Behavior normal.     ED Results / Procedures / Treatments   Labs (all labs ordered are listed, but only abnormal results are displayed) Labs Reviewed - No data to display  EKG None  Radiology No results found.  Procedures Procedures (including critical care time)  Medications Ordered in ED Medications - No data to display  ED Course  I have reviewed the triage vital signs and the nursing notes.  Pertinent labs & imaging results that were available during my care of the patient were reviewed by me and considered in my medical decision making (see chart for details).    MDM Rules/Calculators/A&P                      58-year-old male with a history of asthma presenting with a right index finger laceration that occurred earlier tonight while he was at work.  He cut the tip of the finger on a piece of glass.  Skin adhesive was applied while he was at work.  On exam, he is neurovascular intact.  Skin adhesive is dried and wound is hemostatic.  I do not see any obvious foreign bodies.  He did not note any glass shards and given that he cut the finger on and already broken piece of glass, have a low suspicion for foreign body.  However, since the wound was closed prior to my assessment, will cover the patient for infection with a short course of Keflex.  I offer the patient an x-ray to further evaluate for foreign body, but he declined.  We will also provide the patient with a static finger splint until the wound heals.  He was advised to wear gloves at work until the wound is fully healed.  He has been advised that he needs to have the wound evaluated if he develops signs or symptoms of infection.  ER return precaution given.  He is hemodynamically stable and in no acute distress.  Safe for discharge to home with  outpatient follow-up as needed.  Final Clinical Impression(s) / ED Diagnoses Final diagnoses:  Laceration of right index finger without damage to nail, foreign body presence unspecified, initial encounter    Rx / DC Orders ED Discharge Orders    None       Joanne Gavel, PA-C 10/93/23 5573    Delora Fuel, MD 22/02/54 [phone]

## 2019-07-29 NOTE — Discharge Instructions (Addendum)
Thank you for allowing me to care for you today in the Emergency Department.   Keep the digit clean and dry for the next 24 hours.  After that time, you can gently clean the area with water and soap.  Take 1 tablet of Keflex every 6 hours for the next 5 days to prevent infection.  You can take Tylenol or ibuprofen for pain.  Make sure that you are wearing gloves when you are at work to prevent getting the finger wet until the wound has fully healed.  No submerging the digit in a bath or pool until the wound is fully healed.  If you develop redness, swelling, red streaking down the finger, if the wound starts to have thick, mucus-like drainage, or other new, concerning symptoms, you should follow-up with primary care or return to the emergency department for a wound recheck.

## 2019-07-29 NOTE — ED Notes (Signed)
Pt discharged from ED; instructions provided and scripts given; Pt encouraged to return to ED if symptoms worsen and to f/u with PCP; Pt verbalized understanding of all instructions 

## 2019-08-23 ENCOUNTER — Other Ambulatory Visit: Payer: Self-pay

## 2019-08-23 ENCOUNTER — Emergency Department (HOSPITAL_COMMUNITY): Payer: Medicare Other

## 2019-08-23 ENCOUNTER — Emergency Department (HOSPITAL_COMMUNITY)
Admission: EM | Admit: 2019-08-23 | Discharge: 2019-08-23 | Disposition: A | Discharge status: Home / Self Care | Payer: Medicare Other | Attending: Emergency Medicine | Admitting: Emergency Medicine

## 2019-08-23 DIAGNOSIS — R0789 Other chest pain: Secondary | ICD-10-CM | POA: Diagnosis not present

## 2019-08-23 DIAGNOSIS — M79671 Pain in right foot: Secondary | ICD-10-CM | POA: Insufficient documentation

## 2019-08-23 DIAGNOSIS — Z8709 Personal history of other diseases of the respiratory system: Secondary | ICD-10-CM | POA: Insufficient documentation

## 2019-08-23 DIAGNOSIS — Z6841 Body Mass Index (BMI) 40.0 and over, adult: Secondary | ICD-10-CM | POA: Insufficient documentation

## 2019-08-23 DIAGNOSIS — E669 Obesity, unspecified: Secondary | ICD-10-CM | POA: Diagnosis not present

## 2019-08-23 LAB — BASIC METABOLIC PANEL
Anion gap: 12 (ref 5–15)
BUN: 16 mg/dL (ref 6–20)
CO2: 24 mmol/L (ref 22–32)
Calcium: 9 mg/dL (ref 8.9–10.3)
Chloride: 103 mmol/L (ref 98–111)
Creatinine, Ser: 0.75 mg/dL (ref 0.61–1.24)
GFR calc Af Amer: >60 mL/min (ref >60)
GFR calc non Af Amer: >60 mL/min (ref >60)
Glucose, Bld: 98 mg/dL (ref 70–99)
Potassium: 4.2 mmol/L (ref 3.5–5.1)
Sodium: 139 mmol/L (ref 135–145)

## 2019-08-23 LAB — CBC
HCT: 44.4 % (ref 39.0–52.0)
Hemoglobin: 14.2 g/dL (ref 13.0–17.0)
MCH: 27.8 pg (ref 26.0–34.0)
MCHC: 32 g/dL (ref 30.0–36.0)
MCV: 87.1 fL (ref 80.0–100.0)
Platelets: 283 10*3/uL (ref 150–400)
RBC: 5.1 MIL/uL (ref 4.22–5.81)
RDW: 13.4 % (ref 11.5–15.5)
WBC: 8.2 10*3/uL (ref 4.0–10.5)
nRBC: 0 % (ref 0.0–0.2)

## 2019-08-23 LAB — TROPONIN I (HIGH SENSITIVITY): Troponin I (High Sensitivity): 4 ng/L (ref <18)

## 2019-08-23 NOTE — ED Triage Notes (Signed)
Patient complains of CP that is worse with inspiration and movement, states that the pain is worse with inspiration and movement. Also complains of foot pain

## 2019-08-23 NOTE — ED Notes (Signed)
PT reported he was more worried about his RT Foot because there was a raised area on it.  Pt thought the foot pain caused the CP.

## 2019-08-23 NOTE — Discharge Instructions (Addendum)
Your xray is normal. Please read instructions below. Apply ice to your foot for 20 minutes at a time. Elevate it to help with swelling. You can take ibuprofen every 6 hours as needed for pain. Schedule an appointment with your primary care for follow-up. Return to the ER for new or concerning symptoms.

## 2019-08-23 NOTE — ED Notes (Signed)
Patient verbalizes understanding of discharge instructions . Opportunity for questions and answers were provided . Armband removed by staff ,Pt discharged from ED. W/C  offered at D/C  and Declined W/C at D/C and was escorted to lobby by RN.  

## 2019-08-23 NOTE — ED Provider Notes (Signed)
[facility] EMERGENCY DEPARTMENT Provider Note   CSN: [id number] Arrival date & time: 08/23/19  1217     History Chief Complaint  Patient presents with  . Foot Pain  . Chest Pain    Robert Stokes is a 29 y.o. male with past medical history of asthma, presenting to the emergency department with complaint of right foot pain that began about 2 weeks ago.  He states he had begun wearing arch support insoles in his shoes due to having flat feet and pronation.  This is recommended by his chiropractor.  This significantly helped the pain in his arches of his feet, however he began having gradual onset of pain to the dorsum of his right foot.  Pain is towards the base of his fourth and fifth toes extending toward his proximal foot.  He states that pain gradually comes on throughout the day and is worse after work.  It is worse with weightbearing.  It is better with rest.  He has similar pain to the left foot though it is not as severe.  No medications tried.  No known injuries.  Of note, patient describes chest pain in triage upon arrival to the ED.  He states this was usual chest tightness he feels prior to having an asthma attack.  He states this was brought on by changing from air conditioned indoors to going out into the heat today.  He states his symptoms have resolved upon waiting to be seen in the waiting room.  He is currently has no chest complaints on evaluation.  The history is provided by the patient.       Past Medical History:  Diagnosis Date  . Asthma   . Obesity     Patient Active Problem List   Diagnosis Date Noted  . Moderate left ankle sprain 01/13/2019    Past Surgical History:  Procedure Laterality Date  . ADENOIDECTOMY    . TONSILLECTOMY         No family history on file.  Social History   Tobacco Use  . Smoking status: Never Smoker  . Smokeless tobacco: Never Used  Substance Use Topics  . Alcohol use: No  . Drug use: No    Home  Medications Prior to Admission medications   Medication Sig Start Date End Date Taking? Authorizing Provider  ibuprofen (ADVIL) 800 MG tablet Take 1 tablet (800 mg total) by mouth 3 (three) times daily. 01/11/19   Montine Circle, PA-C  naproxen (NAPROSYN) 375 MG tablet Take 1 tablet (375 mg total) by mouth 2 (two) times daily. 01/03/19   Etta Quill, NP  NON FORMULARY Apply 1 application topically daily as needed ("essential oils").    [provider]    Allergies    Patient has no known allergies.  Review of Systems   Review of Systems  All other systems reviewed and are negative.   Physical Exam Updated Vital Signs BP (!) 145/78 (BP Location: Left Arm)   Pulse 77   Temp 98.5 F (36.9 C) (Oral)   Resp 16   Ht 6' (1.829 m)   Wt 136.1 kg   SpO2 95%   BMI 40.69 kg/m   Physical Exam Vitals and nursing note reviewed.  Constitutional:      General: He is not in acute distress.    Appearance: He is well-developed. He is obese.  HENT:     Head: Normocephalic and atraumatic.  Eyes:     Conjunctiva/sclera: Conjunctivae normal.  Cardiovascular:     Rate and Rhythm: Normal rate and regular rhythm.  Pulmonary:     Effort: Pulmonary effort is normal. No respiratory distress.     Breath sounds: Normal breath sounds.  Musculoskeletal:     Comments: Right foot w TTP over dorsum. No bruising, redness or warmth. Normal ROM.   Neurological:     Mental Status: He is alert.  Psychiatric:        Mood and Affect: Mood normal.        Behavior: Behavior normal.     ED Results / Procedures / Treatments   Labs (all labs ordered are listed, but only abnormal results are displayed) Labs Reviewed  CBC  BASIC METABOLIC PANEL  TROPONIN I (HIGH SENSITIVITY)    EKG None  Radiology DG Chest 2 View  Result Date: 08/23/2019 CLINICAL DATA:  Chest pain and shortness of breath EXAM: CHEST - 2 VIEW COMPARISON:  01/27/2015 FINDINGS: The heart size and mediastinal contours are  within normal limits. Both lungs are clear. The visualized skeletal structures are unremarkable. IMPRESSION: No active cardiopulmonary disease. Electronically Signed   By: Alcide Clever M.D.   On: 08/23/2019 12:56   DG Foot Complete Right  Result Date: 08/23/2019 CLINICAL DATA:  Right foot pain intermittently for 15 years EXAM: RIGHT FOOT COMPLETE - 3+ VIEW COMPARISON:  01/12/2010 FINDINGS: Frontal, oblique, lateral views of the right foot are obtained. No fracture, subluxation, or dislocation. Joint spaces are well preserved. Soft tissues are unremarkable. IMPRESSION: 1. Unremarkable right foot. Electronically Signed   By: Sharlet Salina M.D.   On: 08/23/2019 17:17    Procedures Procedures (including critical care time)  Medications Ordered in ED Medications - No data to display  ED Course  I have reviewed the triage vital signs and the nursing notes.  Pertinent labs & imaging results that were available during my care of the patient were reviewed by me and considered in my medical decision making (see chart for details).    MDM Rules/Calculators/A&P                      Pt right waxing and waning right foot pain over the last 2 weeks. Patient has similar pain in the left foot that was worse in the right. Pain gradually worsens throughout the day and is better with rest. He does report recent new arch support orthotics. No known injury. X-rays negative for findings. Recommend RICE therapy, NSAIDs as needed. PCP follow-up discussed.   Of note, pt complained of chest tightness upon arrival to the ED which he attributed to his asthma. He obtained labs and CXR which are unremarkable. Pt is asymptomatic on evaluation.   Patient discharged  Discussed results, findings, treatment and follow up. Patient advised of return precautions. Patient verbalized understanding and agreed with plan.  Final Clinical Impression(s) / ED Diagnoses Final diagnoses:  Right foot pain    Rx / DC Orders ED  Discharge Orders    None       Robinson, [place] N, PA-C 08/23/19 1732    Terrilee Files, MD 08/24/19 [phone]

## 2019-09-10 ENCOUNTER — Emergency Department (HOSPITAL_COMMUNITY)
Admission: EM | Admit: 2019-09-10 | Discharge: 2019-09-10 | Disposition: A | Discharge status: Home / Self Care | Payer: Medicare Other | Attending: Emergency Medicine | Admitting: Emergency Medicine

## 2019-09-10 ENCOUNTER — Encounter (HOSPITAL_COMMUNITY): Payer: Self-pay | Admitting: Emergency Medicine

## 2019-09-10 ENCOUNTER — Other Ambulatory Visit: Payer: Self-pay

## 2019-09-10 ENCOUNTER — Emergency Department (HOSPITAL_COMMUNITY): Payer: Medicare Other

## 2019-09-10 DIAGNOSIS — M79672 Pain in left foot: Secondary | ICD-10-CM | POA: Insufficient documentation

## 2019-09-10 DIAGNOSIS — J45909 Unspecified asthma, uncomplicated: Secondary | ICD-10-CM | POA: Insufficient documentation

## 2019-09-10 MED ORDER — NAPROXEN 375 MG PO TABS: 375.0000 mg | ORAL_TABLET | Freq: Two times a day (BID) | ORAL | 0 refills | Status: AC

## 2019-09-10 NOTE — ED Triage Notes (Signed)
Pt reports that since Monday having left foot pain, swelling and turning black. 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 Thought maybe it was due to his work shoes. Reports elevating when not at work and putting ice on foot.

## 2019-09-10 NOTE — ED Provider Notes (Signed)
[facility]-EMERGENCY DEPT Provider Note   CSN: [id number] Arrival date & time: 09/10/19  1343     History Chief Complaint  Patient presents with   Foot Pain    Robert Stokes is a 29 y.o. male with past medical history significant for asthma, obesity, pes planus.  HPI Patient presents to the emergency department today with chief complaint of progressively worsening left foot pain x6 days. 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 He is supposed to have orthotics in his shoes however has not been fitted to have them in his work shoes.  He has tried using Dr. Margart Sickles inserts which she states has only mildly helped the pain.  He is describing the pain as throbbing and aching.  Pain is located on the medial aspect of left foot and radiates across forefoot. He has been taking ibuprofen without improvement of pain. He rates pain 7/10 in severity. Pain is worse with ambulation.  He states sometimes after a long workday his feet are swollen.  He denies any recent injury or fall.  He states his pain feels similar to pain he has had in the past.  He has never seen a podiatrist.  Past Medical History:  Diagnosis Date   Asthma    Obesity     Patient Active Problem List   Diagnosis Date Noted   Moderate left ankle sprain 01/13/2019    Past Surgical History:  Procedure Laterality Date   ADENOIDECTOMY     TONSILLECTOMY         No family history on file.  Social History   Tobacco Use   Smoking status: Never Smoker   Smokeless tobacco: Never Used  Substance Use Topics   Alcohol use: No   Drug use: No    Home Medications Prior to Admission medications   Medication Sig Start Date End Date Taking? Authorizing Provider  ibuprofen (ADVIL) 800 MG tablet Take 1 tablet (800 mg total) by mouth 3 (three) times daily. 01/11/19   Roxy Horseman, PA-C  naproxen (NAPROSYN) 375 MG tablet Take 1 tablet (375 mg total) by mouth 2 (two)  times daily for 7 days. 09/10/19 09/17/19  Joud Ingwersen E, PA-C  NON FORMULARY Apply 1 application topically daily as needed ("essential oils").    [provider]    Allergies    Patient has no known allergies.  Review of Systems   Review of Systems All other systems are reviewed and are negative for acute change except as noted in the HPI.  Physical Exam Updated Vital Signs BP (!) 158/104    Pulse 82    Temp 97.9 F (36.6 C) (Oral)    Resp 18    Ht 6' (1.829 m)    Wt 136.1 kg    SpO2 99%    BMI 40.69 kg/m   Physical Exam Vitals and nursing note reviewed.  Constitutional:      Appearance: He is well-developed. He is not ill-appearing or toxic-appearing.  HENT:     Head: Normocephalic and atraumatic.     Nose: Nose normal.  Eyes:     General: No scleral icterus.       Right eye: No discharge.        Left eye: No discharge.     Conjunctiva/sclera: Conjunctivae normal.  Neck:     Vascular: No JVD.  Cardiovascular:     Rate and Rhythm: Normal rate and regular rhythm.  Pulses: Normal pulses.          Dorsalis pedis pulses are 2+ on the right side and 2+ on the left side.     Heart sounds: Normal heart sounds.  Pulmonary:     Effort: Pulmonary effort is normal.     Breath sounds: Normal breath sounds.  Abdominal:     General: There is no distension.  Musculoskeletal:        General: Normal range of motion.     Cervical back: Normal range of motion.     Right lower leg: No edema.     Left lower leg: No edema.       Feet:  Feet:     Right foot:     Skin integrity: Skin integrity normal.     Toenail Condition: Right toenails are normal.     Left foot:     Skin integrity: Skin integrity normal.     Toenail Condition: Left toenails are normal.     Comments: Tenderness to palpation as depicted in image above.  Equal tactile temperature in bilateral feet. Skin:    General: Skin is warm and dry.     Capillary Refill: Capillary refill takes less than 2  seconds.     Findings: No rash.  Neurological:     Mental Status: He is oriented to person, place, and time.     GCS: GCS eye subscore is 4. GCS verbal subscore is 5. GCS motor subscore is 6.     Comments: Fluent speech, no facial droop.  Psychiatric:        Behavior: Behavior normal.     ED Results / Procedures / Treatments   Labs (all labs ordered are listed, but only abnormal results are displayed) Labs Reviewed - No data to display  EKG None  Radiology DG Foot Complete Left  Result Date: 09/10/2019 CLINICAL DATA:  Pain and swelling EXAM: LEFT FOOT - COMPLETE 3+ VIEW COMPARISON:  November 28, 2014 FINDINGS: Frontal, oblique, and lateral views were obtained. No fracture or dislocation. Joint spaces appear normal. No erosive change. IMPRESSION: No fracture or dislocation.  No evident arthropathy. Electronically Signed   By: Bretta Bang III M.D.   On: 09/10/2019 16:15    Procedures Procedures (including critical care time)  Medications Ordered in ED Medications - No data to display  ED Course  I have reviewed the triage vital signs and the nursing notes.  Pertinent labs & imaging results that were available during my care of the patient were reviewed by me and considered in my medical decision making (see chart for details).    MDM Rules/Calculators/A&P                          History provided by patient with additional history obtained from chart review.    Patient presents to the ED with complaints of pain to the left foot. He has been seen for similar pain in the past. Exam without obvious deformity or open wounds. ROM intact. Tender to palpation over left medial aspect and forefoot.Marland Kitchen NVI distally.  DP pulse 2+ bilaterally.  There is equal tactile temperature in bilateral feet.  Exam is not concerning for ischemic limb, septic joint, gout. Xray negative for fracture/dislocation. Will discharge with prescription for naproxen for pain and inflammation.  Also discussed  wearing compression stockings with patient to help with swelling after prolonged standing. I discussed results, treatment plan, need for follow-up, and return precautions  with the patient. Provided opportunity for questions, patient confirmed understanding and are in agreement with plan.  Will have patient follow-up with on-call podiatry.  He is agreeable with plan of care.  Portions of this note were generated with [employer]. Dictation errors may occur despite best attempts at proofreading.   Final Clinical Impression(s) / ED Diagnoses Final diagnoses:  Left foot pain    Rx / DC Orders ED Discharge Orders         Ordered    naproxen (NAPROSYN) 375 MG tablet  2 times daily     Discontinue  Reprint     09/10/19 [place], Harley Hallmark, PA-C 09/10/19 1718    Virgel Manifold, MD 09/10/19 2013

## 2019-09-10 NOTE — Discharge Instructions (Signed)
You have been seen today for foot pain. Please read and follow all provided instructions. Return to the emergency room for worsening condition or new concerning symptoms.    1. Medications:  Prescription sent to your pharmacy for naproxen.  This is an anti-inflammatory medication.  You should help with pain and swelling.  Take as prescribed.  You should take with food so it does not cause upset stomach.  Do not take any additional Aleve, ibuprofen or Motrin as these medicines are similar.  Continue usual home medications Take medications as prescribed. Please review all of the medicines and only take them if you do not have an allergy to them.   2. Treatment: rest, drink plenty of fluids  3. Follow Up:  Please follow up with primary care provider by scheduling an appointment as soon as possible for a visit  If you do not have a primary care physician, contact [facility] at [phone] for referral  -Also recommend you follow-up with [facility].  When you call you should say for emergency department follow-up visit.  It is also a possibility that you have an allergic reaction to any of the medicines that you have been prescribed - Everybody reacts differently to medications and while MOST people have no trouble with most medicines, you may have a reaction such as nausea, vomiting, rash, swelling, shortness of breath. If this is the case, please stop taking the medicine immediately and contact your physician.  ?

## 2019-09-28 ENCOUNTER — Ambulatory Visit (INDEPENDENT_AMBULATORY_CARE_PROVIDER_SITE_OTHER): Payer: Medicare Other | Admitting: Podiatry

## 2019-09-28 ENCOUNTER — Encounter: Payer: Self-pay | Admitting: Podiatry

## 2019-09-28 ENCOUNTER — Ambulatory Visit (INDEPENDENT_AMBULATORY_CARE_PROVIDER_SITE_OTHER): Payer: Medicare Other

## 2019-09-28 ENCOUNTER — Other Ambulatory Visit: Payer: Self-pay

## 2019-09-28 DIAGNOSIS — M84375A Stress fracture, left foot, initial encounter for fracture: Secondary | ICD-10-CM

## 2019-09-28 DIAGNOSIS — M79672 Pain in left foot: Secondary | ICD-10-CM | POA: Diagnosis not present

## 2019-09-28 NOTE — Progress Notes (Signed)
  Subjective:  Patient ID: LENZIE SANDLER, male    DOB: 04-10-1990,  MRN: [id number]  Chief Complaint  Patient presents with  . Foot Pain    Pt states bilateral foot pain, shooting pain from lateral aspect into dorsal aspect, 1 month duration, no known injuries.    29 y.o. male presents with the above complaint. History confirmed with patient.  States that he has pain 2 or 3 hours into his work shift.  Works on his feet as a Public affairs consultant.  Objective:  Physical Exam: warm, good capillary refill, no trophic changes or ulcerative lesions, normal DP and PT pulses and normal sensory exam. Left Foot: severe tenderness 5th metatarsal area, local edema.  Right Foot: NO POP  4th/5th metatarsal areas  No images are attached to the encounter.  Radiographs: X-ray of the left foot: Transverse irregularity concerning for stress fracture fifth metatarsal Assessment:   1. Stress fracture of metatarsal bone of left foot, initial encounter   2. Pain in left foot    Plan:  Patient was evaluated and treated and all questions answered.  Stress fracture left 5th metatarsal -Prior x-rays reviewed -Repeat XR left shows likely stress fx -Dispense CAM boot -Offered to take patient out of work, patient declined and stated that he could not do this.  Instead offered to provide frequent breaks patient would like this option.  No more than 2 hours standing without breaks.   No follow-ups on file.

## 2019-10-23 ENCOUNTER — Ambulatory Visit (INDEPENDENT_AMBULATORY_CARE_PROVIDER_SITE_OTHER): Payer: Medicare Other | Admitting: Podiatry

## 2019-10-23 ENCOUNTER — Encounter: Payer: Self-pay | Admitting: Podiatry

## 2019-10-23 ENCOUNTER — Other Ambulatory Visit: Payer: Self-pay

## 2019-10-23 DIAGNOSIS — S93529A Sprain of metatarsophalangeal joint of unspecified toe(s), initial encounter: Secondary | ICD-10-CM

## 2019-10-23 DIAGNOSIS — S96092A Other injury of muscle and tendon of long flexor muscle of toe at ankle and foot level, left foot, initial encounter: Secondary | ICD-10-CM

## 2019-10-23 DIAGNOSIS — M79672 Pain in left foot: Secondary | ICD-10-CM

## 2019-10-23 DIAGNOSIS — M84375A Stress fracture, left foot, initial encounter for fracture: Secondary | ICD-10-CM

## 2019-10-23 MED ORDER — INDOMETHACIN 25 MG PO CAPS: 25.0000 mg | ORAL_CAPSULE | Freq: Three times a day (TID) | ORAL | 0 refills | Status: AC

## 2019-10-23 NOTE — Progress Notes (Signed)
  Subjective:  Patient ID: Robert Stokes, male    DOB: November 13, 1990,  MRN: [id number]  Chief Complaint  Patient presents with  . Follow-up    pt heard a pop today and has alot of pain- states the pain in the toe area this round    29 y.o. male presents with the above complaint. History confirmed with patient.  Heard and felt a pop near the great toe, which is a new issue, most previous pain was in the fifth metatarsal she was seeing Dr. Samuella Cota for which they thought for a stress fracture.  He is weightbearing in a CAM boot.  He works on his feet as a Engineer, maintenance:  Physical Exam: warm, good capillary refill, no trophic changes or ulcerative lesions, normal DP and PT pulses and normal sensory exam. Left Foot: Severe pain over the dorsal forefoot, worse about the first ray, he is unable to actively flex and extend his hallux secondary to pain, this is painful during passive range of motion as well.  Mild pain over fifth metatarsal.  Radiographs: X-ray of the right foot: No evidence of worsening fracture, similar to previous radiographs, no dislocation, joint erosions Assessment:   1. Acute pain of left foot   2. Stress fracture of metatarsal bone of left foot, initial encounter   3. Turf toe, initial encounter   4. Other injury of muscle and tendon of long flexor muscle of toe at ankle and foot level, left foot, initial encounter      Plan:  Patient was evaluated and treated and all questions answered.  -He has severe tenderness about the first ray and is unwilling or unable to flex and extend the hallux secondary to pain.  I think unlikely that he was able to sustain a turf toe injury while in the cam boot, but is still possible.  Other differentials include acute gout flare (he does not have a previous history of this) versus worsening stress fracture of the fifth metatarsal which is less painful today. -Lab work ordered for gout work-up -Ordering MRI for evaluation of stress  fracture and/or tendinous/capsular injury to the long flexor extensors of the hallux or sesamoid complex  Sharl Ma, DPM 10/23/2019     Return in about 3 weeks (around 11/13/2019) for re-check left foot pain.

## 2019-10-24 ENCOUNTER — Telehealth: Payer: Self-pay | Admitting: *Deleted

## 2019-10-24 DIAGNOSIS — M84375A Stress fracture, left foot, initial encounter for fracture: Secondary | ICD-10-CM

## 2019-10-24 DIAGNOSIS — S96092A Other injury of muscle and tendon of long flexor muscle of toe at ankle and foot level, left foot, initial encounter: Secondary | ICD-10-CM

## 2019-10-24 DIAGNOSIS — S92902S Unspecified fracture of left foot, sequela: Secondary | ICD-10-CM

## 2019-10-24 DIAGNOSIS — M79672 Pain in left foot: Secondary | ICD-10-CM

## 2019-10-24 DIAGNOSIS — S93529A Sprain of metatarsophalangeal joint of unspecified toe(s), initial encounter: Secondary | ICD-10-CM

## 2019-10-24 NOTE — Telephone Encounter (Signed)
Faxed orders, clinicals and demographics to [facility] for pre-cert and scheduling. 

## 2019-10-24 NOTE — Telephone Encounter (Signed)
-----   Message from Edwin Cap, DPM sent at 10/23/2019  5:28 PM EDT ----- Regarding: MRI left foot Hi Val,  Can you order a non contrast MRI of the left foot for Mr Pulido? Dx is acute left foot pain, stress fracture of 5th metatarsal, turf toe injury, and flexor/extensor tendon injury of the left hallux.  Thanks! Madelaine Bhat

## 2019-10-27 ENCOUNTER — Ambulatory Visit: Payer: Medicare Other | Admitting: Podiatry

## 2019-11-21 ENCOUNTER — Encounter: Payer: Self-pay | Admitting: Podiatry

## 2019-11-21 ENCOUNTER — Ambulatory Visit: Payer: Medicare Other

## 2019-11-21 ENCOUNTER — Ambulatory Visit (INDEPENDENT_AMBULATORY_CARE_PROVIDER_SITE_OTHER): Payer: Medicare Other | Admitting: Podiatry

## 2019-11-21 ENCOUNTER — Other Ambulatory Visit: Payer: Self-pay

## 2019-11-21 VITALS — Temp 97.7°F

## 2019-11-21 DIAGNOSIS — M84375A Stress fracture, left foot, initial encounter for fracture: Secondary | ICD-10-CM

## 2019-11-22 NOTE — Progress Notes (Signed)
  Subjective:  Patient ID: Robert Stokes, male    DOB: Jun 28, 1990,  MRN: [id number]  Chief Complaint  Patient presents with  . Fracture    follow up stress fracture to left foot patient stated that he has waves of pain that comes and goes across the top of foot and has some numbness when he is sitting, says the boot helps a little but not alot    29 y.o. male returns with the above complaint. History confirmed with patient.  He has not gotten his MRI yet he tried to reschedule his appointment following MRI but was unable to get through to the office.  Still in a CAM boot, still at work and still about the same amount of pain.  Objective:  Physical Exam: warm, good capillary refill, no trophic changes or ulcerative lesions, normal DP and PT pulses and normal sensory exam. Left Foot: Still painful about the first ray of the forefoot   Assessment:   1. Stress fracture of metatarsal bone of left foot, initial encounter      Plan:  Patient was evaluated and treated and all questions answered.  -Return in 1 week after MRI -Continue CAM boot -He will stay out of work for now and will be documentation for this from the front desk  Sharl Ma, DPM 11/22/2019     Return in about 1 week (around 11/28/2019) for after MRI to review.

## 2019-11-24 ENCOUNTER — Ambulatory Visit
Admission: RE | Admit: 2019-11-24 | Discharge: 2019-11-24 | Disposition: A | Discharge status: Home / Self Care | Payer: Medicare Other | Source: Ambulatory Visit | Attending: Podiatry | Admitting: Podiatry

## 2019-11-24 ENCOUNTER — Other Ambulatory Visit: Payer: Self-pay

## 2019-11-25 LAB — CBC WITH DIFFERENTIAL/PLATELET
Absolute Monocytes: 560 cells/uL (ref 200–950)
Basophils Absolute: 28 cells/uL (ref 0–200)
Basophils Relative: 0.4 %
Eosinophils Absolute: 161 cells/uL (ref 15–500)
Eosinophils Relative: 2.3 %
HCT: 41 % (ref 38.5–50.0)
Hemoglobin: 13.2 g/dL (ref 13.2–17.1)
Lymphs Abs: 1792 cells/uL (ref 850–3900)
MCH: 27.6 pg (ref 27.0–33.0)
MCHC: 32.2 g/dL (ref 32.0–36.0)
MCV: 85.8 fL (ref 80.0–100.0)
MPV: 10.1 fL (ref 7.5–12.5)
Monocytes Relative: 8 %
Neutro Abs: 4459 cells/uL (ref 1500–7800)
Neutrophils Relative %: 63.7 %
Platelets: 215 10*3/uL (ref 140–400)
RBC: 4.78 10*6/uL (ref 4.20–5.80)
RDW: 13.2 % (ref 11.0–15.0)
Total Lymphocyte: 25.6 %
WBC: 7 10*3/uL (ref 3.8–10.8)

## 2019-11-25 LAB — BASIC METABOLIC PANEL
BUN: 17 mg/dL (ref 7–25)
CO2: 29 mmol/L (ref 20–32)
Calcium: 9 mg/dL (ref 8.6–10.3)
Chloride: 105 mmol/L (ref 98–110)
Creat: 0.73 mg/dL (ref 0.60–1.35)
Glucose, Bld: 118 mg/dL — ABNORMAL HIGH (ref 65–99)
Potassium: 4.1 mmol/L (ref 3.5–5.3)
Sodium: 139 mmol/L (ref 135–146)

## 2019-11-25 LAB — SEDIMENTATION RATE: Sed Rate: 14 mm/h (ref 0–15)

## 2019-11-25 LAB — URIC ACID: Uric Acid, Serum: 5.9 mg/dL (ref 4.0–8.0)

## 2019-11-27 ENCOUNTER — Other Ambulatory Visit: Payer: Self-pay

## 2019-11-27 ENCOUNTER — Encounter: Payer: Self-pay | Admitting: Podiatry

## 2019-11-27 ENCOUNTER — Ambulatory Visit (INDEPENDENT_AMBULATORY_CARE_PROVIDER_SITE_OTHER): Payer: Medicare Other | Admitting: Podiatry

## 2019-11-27 DIAGNOSIS — M216X2 Other acquired deformities of left foot: Secondary | ICD-10-CM

## 2019-11-27 DIAGNOSIS — M216X1 Other acquired deformities of right foot: Secondary | ICD-10-CM | POA: Diagnosis not present

## 2019-11-27 DIAGNOSIS — M2141 Flat foot [pes planus] (acquired), right foot: Secondary | ICD-10-CM

## 2019-11-27 DIAGNOSIS — M2142 Flat foot [pes planus] (acquired), left foot: Secondary | ICD-10-CM

## 2019-11-27 DIAGNOSIS — M84375D Stress fracture, left foot, subsequent encounter for fracture with routine healing: Secondary | ICD-10-CM | POA: Diagnosis not present

## 2019-11-27 DIAGNOSIS — M21862 Other specified acquired deformities of left lower leg: Secondary | ICD-10-CM

## 2019-11-27 DIAGNOSIS — M21861 Other specified acquired deformities of right lower leg: Secondary | ICD-10-CM

## 2019-11-27 MED ORDER — METHYLPREDNISOLONE 4 MG PO TBPK: ORAL_TABLET | ORAL | 0 refills | Status: DC

## 2019-11-27 NOTE — Patient Instructions (Signed)
Bauerfeind MalleoTrain Plus Ankle Brace Size 6

## 2019-11-27 NOTE — Progress Notes (Signed)
Subjective:  Patient ID: Robert Stokes, male    DOB: 1991/02/06,  MRN: [id number]  Chief Complaint  Patient presents with  . Foot Pain    Left - 1 week follow up -MRI results    29 y.o. male returns with the above complaint. History confirmed with patient.  Completed his MRI.  He still been wearing the cam boot.  Pain is about the same.  He is eager to go back to work. Objective:  Physical Exam: warm, good capillary refill, no trophic changes or ulcerative lesions, normal DP and PT pulses and normal sensory exam. Left Foot: Still painful about the first ray of the forefoot into the mid arch  Study Result  Narrative & Impression  CLINICAL DATA:  Dorsal foot pain for the past few months. Concern for stress fracture.  EXAM: MRI OF THE LEFT FOOT WITHOUT CONTRAST  TECHNIQUE: Multiplanar, multisequence MR imaging of the left ankle was performed. No intravenous contrast was administered.  COMPARISON:  Left foot x-rays dated September 28, 2019. Left ankle x-rays dated January 11, 2019.  FINDINGS: TENDONS  Peroneal: Peroneal longus tendon intact. Peroneal brevis intact.  Posteromedial: Posterior tibial tendon intact. Flexor hallucis longus tendon intact. Flexor digitorum longus tendon intact.  Anterior: Tibialis anterior tendon intact. Extensor hallucis longus tendon intact Extensor digitorum longus tendon intact.  Achilles:  Intact.  Plantar Fascia: Intact.  LIGAMENTS  Lateral: Anterior talofibular ligament intact. Calcaneofibular ligament intact. Posterior talofibular ligament intact. Anterior and posterior tibiofibular ligaments intact.  Medial: Deltoid ligament intact. Spring ligament intact.  CARTILAGE  Ankle Joint: No joint effusion. Normal ankle mortise. No chondral defect.  Subtalar Joints/Sinus Tarsi: Normal subtalar joints. No subtalar joint effusion. Normal sinus tarsi.  Bones: No marrow signal abnormality.  No fracture or  dislocation.  Soft Tissue: Dorsal and lateral mid and forefoot soft tissue swelling. No soft tissue mass or fluid collection.  IMPRESSION: 1. No acute abnormality or significant degenerative changes. 2. Dorsal and lateral mid and forefoot soft tissue swelling.   Electronically Signed   By: Obie Dredge M.D.   On: 11/25/2019 09:47   11/24/2019 1051 11/25/2019 0319 Uric Acid [[id number]]  Blood   Component Value Units  Uric Acid, Serum 5.9  mg/dL        63/87/5643 3295 11/25/2019 0319 Basic Metabolic Panel [[id number]]  (Abnormal)  Blood   Component Value Units  Glucose, Bld 118High  mg/dL  BUN 17 mg/dL  Creat 3.01 mg/dL  BUN/Creatinine Ratio NOT APPLICABLE (calc)  Sodium 139 mmol/L  Potassium 4.1 mmol/L  Chloride 105 mmol/L  CO2 29 mmol/L  Calcium 9.0 mg/dL        60/12/9321 5573 11/25/2019 0319 Sedimentation Rate [[id number]]  Blood   Component Value Units  Sed Rate 14 mm/h        11/24/2019 1051 11/25/2019 0319 CBC with Differential [[id number]]  Blood   Component Value Units  WBC 7.0 Thousand/uL  RBC 4.78 Million/uL  Hemoglobin 13.2 g/dL  HCT 51.7 %  MCV 61.6 fL  MCH 27.6 pg  MCHC 32.2 g/dL  RDW 07.3 %  Platelets 215 Thousand/uL  MPV 10.1 fL  Neutro Abs 4,459 cells/uL  Lymphs Abs 1,792 cells/uL  Absolute Monocytes 560 cells/uL  Eosinophils Absolute 161 cells/uL  Basophils Absolute 28 cells/uL  Neutrophils Relative % 63.7 %  Total Lymphocyte 25.6 %  Monocytes Relative 8.0 %  Eosinophils Relative 2.3 %  Basophils Relative 0.4 %          Assessment:   1. Stress  fracture of metatarsal bone of left foot with routine healing, subsequent encounter   2. Pes planus of both feet   3. Gastrocnemius equinus of left lower extremity   4. Gastrocnemius equinus of right lower extremity      Plan:  Patient was evaluated and treated and all questions answered.  -Reviewed his MRI and lab work with him and that this is all normal.  No evidence of  stress fracture or sesamoiditis  -At this point I believe majority of his pain is secondary to his body habitus and pes planus.  Recommended supporting this with an orthotic, he will check with his insurance company regarding coverage and will schedule appointment for casting for orthotics.  -Rx for Medrol Dosepak was sent to his pharmacy.  -Cleared him to return to work gradually.  Recommend he transition out of the CAM boot and into a supportive ankle brace.  We attempted to fit him for a Tri-Lock ankle brace today but he felt it was too tight.  I recommended Bauernfeind MalleoTrain with a figure-of-eight support  -Recommended physical therapy for return to work conditioning and strengthening and support  Sharl Ma, DPM 11/27/2019     Return in about 4 weeks (around 12/25/2019).

## 2019-12-21 ENCOUNTER — Ambulatory Visit (INDEPENDENT_AMBULATORY_CARE_PROVIDER_SITE_OTHER): Payer: Medicare Other | Admitting: Podiatry

## 2019-12-21 ENCOUNTER — Other Ambulatory Visit: Payer: Self-pay

## 2019-12-21 ENCOUNTER — Ambulatory Visit (INDEPENDENT_AMBULATORY_CARE_PROVIDER_SITE_OTHER): Payer: Medicare Other | Admitting: Orthotics

## 2019-12-21 DIAGNOSIS — M84375D Stress fracture, left foot, subsequent encounter for fracture with routine healing: Secondary | ICD-10-CM

## 2019-12-21 DIAGNOSIS — M2142 Flat foot [pes planus] (acquired), left foot: Secondary | ICD-10-CM

## 2019-12-21 DIAGNOSIS — M21862 Other specified acquired deformities of left lower leg: Secondary | ICD-10-CM

## 2019-12-21 DIAGNOSIS — M2141 Flat foot [pes planus] (acquired), right foot: Secondary | ICD-10-CM

## 2019-12-21 DIAGNOSIS — M216X2 Other acquired deformities of left foot: Secondary | ICD-10-CM

## 2019-12-21 DIAGNOSIS — M79671 Pain in right foot: Secondary | ICD-10-CM

## 2019-12-21 NOTE — Progress Notes (Signed)
Patient is being seen today for f/o to address congential pes planus/pes planovalgus. Patient is active youth and demonstrates over pronation in gait, prominent medially shifted talus, and collapse of medial column.  Goals are RF stability, longitudinal arch support, decrease in pronation, and ease of discomfort in mobility related activities.   

## 2019-12-22 ENCOUNTER — Encounter: Payer: Self-pay | Admitting: Podiatry

## 2019-12-22 NOTE — Progress Notes (Signed)
  Subjective:  Patient ID: Robert Stokes, male    DOB: 10/20/1990,  MRN: [id number]  Chief Complaint  Patient presents with  . Foot Pain    Pt is following up[- he returned to work and is having pain throughout his shift- pt mentioned he is having to take 2 hr break to rest his feet- further evaluation     29 y.o. male presents with the above complaint. Patient follow-up is following up with generalized pain to the arches and the heels. Patient states that he has been returned to work but he still has pretty good amount of pain and would like to take 2-hour break to rest his foot. He states it works a lot on his foot. He has not received his orthotics he is here also here today to pick up his orthotics as well. He is known to Dr. Lilian Kapur. He denies any other acute complaints. No focalized pain noted.   Review of Systems: Negative except as noted in the HPI. Denies N/V/F/Ch.  Past Medical History:  Diagnosis Date  . Asthma   . Obesity     Current Outpatient Medications:  .  ibuprofen (ADVIL) 800 MG tablet, Take 1 tablet (800 mg total) by mouth 3 (three) times daily., Disp: 21 tablet, Rfl: 0 .  indomethacin (INDOCIN) 25 MG capsule, Take 25 mg by mouth 3 (three) times daily., Disp: , Rfl:  .  methylPREDNISolone (MEDROL DOSEPAK) 4 MG TBPK tablet, 6 day dose pack - take as directed, Disp: 21 tablet, Rfl: 0 .  NON FORMULARY, Apply 1 application topically daily as needed ("essential oils")., Disp: , Rfl:   Social History   Tobacco Use  Smoking Status Never Smoker  Smokeless Tobacco Never Used    No Known Allergies Objective:  There were no vitals filed for this visit. There is no height or weight on file to calculate BMI. Constitutional Well developed. Well nourished.  Vascular Dorsalis pedis pulses palpable bilaterally. Posterior tibial pulses palpable bilaterally. Capillary refill normal to all digits.  No cyanosis or clubbing noted. Pedal hair growth normal.  Neurologic Normal  speech. Oriented to person, place, and time. Epicritic sensation to light touch grossly present bilaterally.  Dermatologic Nails well groomed and normal in appearance. No open wounds. No skin lesions.  Orthopedic:  Generalized pain noted to bilateral lower extremity in the arches and the heels. Dull achy sensation noted. No localized/focal tenderness spot noted.   Radiographs: None Assessment:   1. Pes planus of both feet   2. Gastrocnemius equinus of left lower extremity    Plan:  Patient was evaluated and treated and all questions answered.  Bilateral pes planovalgus with underlying arch pain -I explained to patient the etiology of arch pain of the driving force of pes planovalgus to cause it. Given that patient is on his feet for long period of time without any kind of supportive device such as orthotic he can definitely cause and generalized pain and tiredness to both lower extremity. Patient is scheduled to follow-up with [name] to pick up his orthotics. I encouraged him to wear orthotics recommend slowly I discussed with him the importance of wearing orthotics. Patient states understanding -If any acute issues patient will follow up with Dr. Lilian Kapur.  No follow-ups on file.

## 2020-01-22 ENCOUNTER — Other Ambulatory Visit: Payer: Self-pay

## 2020-01-22 ENCOUNTER — Ambulatory Visit: Payer: Medicare Other | Admitting: Orthotics

## 2020-01-22 DIAGNOSIS — M84375D Stress fracture, left foot, subsequent encounter for fracture with routine healing: Secondary | ICD-10-CM

## 2020-01-22 DIAGNOSIS — M2141 Flat foot [pes planus] (acquired), right foot: Secondary | ICD-10-CM

## 2020-01-22 NOTE — Progress Notes (Signed)
Patient came in today to pick up custom made foot orthotics.  The goals were accomplished and the patient reported no dissatisfaction with said orthotics.  Patient was advised of breakin period and how to report any issues. 

## 2020-03-05 ENCOUNTER — Emergency Department (HOSPITAL_COMMUNITY): Payer: Medicare Other

## 2020-03-05 ENCOUNTER — Emergency Department (HOSPITAL_COMMUNITY)
Admission: EM | Admit: 2020-03-05 | Discharge: 2020-03-05 | Disposition: A | Discharge status: Home / Self Care | Payer: Medicare Other | Attending: Emergency Medicine | Admitting: Emergency Medicine

## 2020-03-05 ENCOUNTER — Other Ambulatory Visit: Payer: Self-pay

## 2020-03-05 ENCOUNTER — Encounter (HOSPITAL_COMMUNITY): Payer: Self-pay | Admitting: Emergency Medicine

## 2020-03-05 DIAGNOSIS — R52 Pain, unspecified: Secondary | ICD-10-CM

## 2020-03-05 DIAGNOSIS — J45909 Unspecified asthma, uncomplicated: Secondary | ICD-10-CM | POA: Insufficient documentation

## 2020-03-05 DIAGNOSIS — M25571 Pain in right ankle and joints of right foot: Secondary | ICD-10-CM | POA: Diagnosis not present

## 2020-03-05 DIAGNOSIS — M25561 Pain in right knee: Secondary | ICD-10-CM | POA: Diagnosis not present

## 2020-03-05 DIAGNOSIS — R030 Elevated blood-pressure reading, without diagnosis of hypertension: Secondary | ICD-10-CM | POA: Insufficient documentation

## 2020-03-05 MED ORDER — ACETAMINOPHEN 325 MG PO TABS
650.0000 mg | ORAL_TABLET | Freq: Once | ORAL | Status: AC
  Administered 2020-03-05: 650 mg via ORAL
  Filled 2020-03-05: qty 2

## 2020-03-05 NOTE — ED Provider Notes (Signed)
[facility]-EMERGENCY DEPT Provider Note   CSN: [id number] Arrival date & time: 03/05/20  1539     History Chief Complaint  Patient presents with  . Knee Pain  . Foot Pain    Robert Stokes is a 29 y.o. male with a past medical history significant for asthma who presents to the ED due to worsening right knee and right ankle pain.  Patient admits to ongoing pain for the past few weeks; however, patient states he stood today at work and heard a "pop" in her right ankle and right knee.  Patient states he is on his feet the entire workday which he believes could be attributing to his pain.  Pain is worse with movement especially ambulation.  No treatment prior to arrival.  Denies numbness/tingling of right lower extremity.  Denies direct trauma to right knee/ankle.  Patient is followed by podiatry, Dr. Revonda Humphrey for his ongoing foot pain.  History obtained from patient and past medical records. No interpreter used during encounter.      Past Medical History:  Diagnosis Date  . Asthma   . Obesity     Patient Active Problem List   Diagnosis Date Noted  . Moderate left ankle sprain 01/13/2019    Past Surgical History:  Procedure Laterality Date  . ADENOIDECTOMY    . TONSILLECTOMY         No family history on file.  Social History   Tobacco Use  . Smoking status: Never Smoker  . Smokeless tobacco: Never Used  Substance Use Topics  . Alcohol use: No  . Drug use: No    Home Medications Prior to Admission medications   Medication Sig Start Date End Date Taking? Authorizing Provider  ibuprofen (ADVIL) 800 MG tablet Take 1 tablet (800 mg total) by mouth 3 (three) times daily. 01/11/19   Roxy Horseman, PA-C  indomethacin (INDOCIN) 25 MG capsule Take 25 mg by mouth 3 (three) times daily. 11/21/19   [provider]  methylPREDNISolone (MEDROL DOSEPAK) 4 MG TBPK tablet 6 day dose pack - take as directed 11/27/19   Edwin Cap, DPM  NON  FORMULARY Apply 1 application topically daily as needed ("essential oils").    [provider]    Allergies    Patient has no known allergies.  Review of Systems   Review of Systems  Musculoskeletal: Positive for arthralgias and gait problem.  Skin: Negative for color change.  Neurological: Negative for numbness.  All other systems reviewed and are negative.   Physical Exam Updated Vital Signs BP (!) 172/93   Pulse 89   Temp 97.9 F (36.6 C) (Oral)   Resp 18   Ht 6' (1.829 m)   Wt 136.1 kg   SpO2 97%   BMI 40.69 kg/m   Physical Exam Vitals and nursing note reviewed.  Constitutional:      General: He is not in acute distress.    Appearance: He is obese. He is not ill-appearing.  HENT:     Head: Normocephalic.  Eyes:     Pupils: Pupils are equal, round, and reactive to light.  Cardiovascular:     Rate and Rhythm: Normal rate and regular rhythm.     Pulses: Normal pulses.     Heart sounds: Normal heart sounds. No murmur heard.  No friction rub. No gallop.   Pulmonary:     Effort: Pulmonary effort is normal.     Breath sounds: Normal breath sounds.  Abdominal:  General: Abdomen is flat. There is no distension.     Palpations: Abdomen is soft.     Tenderness: There is no abdominal tenderness. There is no guarding or rebound.  Musculoskeletal:     Cervical back: Neck supple.     Comments: Decreased ROM of right knee/ankle due to pain. Mild tenderness over anterior portion over right knee. Tenderness over lateral malleolus of right ankle.  Distal pulses and sensation intact.  No overlying erythema.  Mild edema over lateral malleolus.  Skin:    General: Skin is warm and dry.  Neurological:     General: No focal deficit present.     Mental Status: He is alert.     ED Results / Procedures / Treatments   Labs (all labs ordered are listed, but only abnormal results are displayed) Labs Reviewed - No data to display  EKG None  Radiology DG Foot  Complete Right  Result Date: 03/05/2020 CLINICAL DATA:  28 year old male "heard a pop".  Chronic pain. EXAM: RIGHT FOOT COMPLETE - 3+ VIEW COMPARISON:  Right foot series 08/23/2019. FINDINGS: Bone mineralization is within normal limits. There is no evidence of fracture or dislocation. Stable joint spaces and alignment, within normal limits. No discrete soft tissue abnormality. IMPRESSION: Stable, negative. Electronically Signed   By: Odessa Fleming M.D.   On: 03/05/2020 18:21    Procedures Procedures (including critical care time)  Medications Ordered in ED Medications  acetaminophen (TYLENOL) tablet 650 mg (650 mg Oral Given 03/05/20 1757)    ED Course  I have reviewed the triage vital signs and the nursing notes.  Pertinent labs & imaging results that were available during my care of the patient were reviewed by me and considered in my medical decision making (see chart for details).    MDM Rules/Calculators/A&P                         29 year old male presents to the ED due to right knee and ankle pain that started today after standing where he head a "pop".  Patient admits to ongoing bilateral feet pain and is currently being followed by Dr. Revonda Humphrey with podiatry.  Patient denies any direct trauma to right knee/ankle.  Upon arrival, patient is afebrile, not tachycardic or hypoxic.  Elevated BP of 172/93.  Instructed patient to follow-up with PCP for blood pressure recheck within the next week.  Low suspicion for hypertensive urgency/emergency given patient is currently asymptomatic.  Patient in no acute distress and non-ill-appearing.  Physical exam reassuring.  Right lower extremity neurovascularly intact.  Decreased range of motion of right knee/ankle.  No overlying erythema. Mild tenderness over anterior aspect of right knee and lateral malleolus of right ankle. Low suspicion for septic arthritis. Will obtain x-rays to rule out bony fractures. Tylenol given for pain management.   X-rays  personally reviewed which are negative for any bony fractures.  Knee sleeve and ankle brace placed for comfort.  Patient given crutches here in the ED. RICE discussed with patient.  Advised patient take over-the-counter ibuprofen or Tylenol as needed for pain.  Instructed patient to follow-up with podiatrist in regards to his right foot pain.  Instructed patient to follow-up with PCP within the next week to recheck blood pressure. Strict ED precautions discussed with patient. Patient states understanding and agrees to plan. Patient discharged home in no acute distress and stable vitals.  Final Clinical Impression(s) / ED Diagnoses Final diagnoses:  Pain  Acute pain of  right knee  Acute right ankle pain  Elevated blood pressure reading    Rx / DC Orders ED Discharge Orders    None       Jesusita Oka 03/05/20 1933    Sabino Donovan, MD 03/09/20 1454

## 2020-03-05 NOTE — Discharge Instructions (Addendum)
As discussed, your x-rays were negative for any broken bones.  You may take over-the-counter ibuprofen or Tylenol as needed for pain.  Continue to elevate and ice your right leg.  Please follow-up with your podiatrist in regards to your right foot pain.  Your blood pressure was elevated while you are in the ED today.  Please follow-up with PCP within the next week to recheck your blood pressure.  Return to the ER for new or worsening symptoms.

## 2020-03-05 NOTE — ED Triage Notes (Signed)
Patient got up from sitting to standing, states his knee and ankle popped, 'it sounded like there were gunshots outside' from how loud it popped.

## 2020-03-05 NOTE — Progress Notes (Signed)
Orthopedic Tech Progress Note Patient Details:  Robert Stokes Apr 15, 1990 [id number] Patient refused crutches Patient ID: Robert Stokes, male   DOB: 04/03/1990, 29 y.o.   MRN: [id number]   Robert Stokes 03/05/2020, 7:30 PM

## 2020-03-05 NOTE — ED Notes (Signed)
Pt refused crutches.

## 2020-03-05 NOTE — Progress Notes (Signed)
Orthopedic Tech Progress Note Patient Details:  Robert Stokes 1990-07-31 [id number]  Ortho Devices Ortho Device/Splint Location: applied knee sleeve and aso to RLE Ortho Device/Splint Interventions: Ordered, Application   Post Interventions Patient Tolerated: Well Instructions Provided: Care of device   Jennye Moccasin 03/05/2020, 7:30 PM

## 2020-05-18 ENCOUNTER — Emergency Department (HOSPITAL_COMMUNITY)
Admission: EM | Admit: 2020-05-18 | Discharge: 2020-05-18 | Disposition: A | Discharge status: Home / Self Care | Payer: Medicare Other | Attending: Emergency Medicine | Admitting: Emergency Medicine

## 2020-05-18 ENCOUNTER — Emergency Department (HOSPITAL_COMMUNITY): Payer: Medicare Other

## 2020-05-18 ENCOUNTER — Other Ambulatory Visit: Payer: Self-pay

## 2020-05-18 ENCOUNTER — Encounter (HOSPITAL_COMMUNITY): Payer: Self-pay

## 2020-05-18 DIAGNOSIS — M79604 Pain in right leg: Secondary | ICD-10-CM | POA: Insufficient documentation

## 2020-05-18 DIAGNOSIS — M25561 Pain in right knee: Secondary | ICD-10-CM

## 2020-05-18 DIAGNOSIS — X501XXA Overexertion from prolonged static or awkward postures, initial encounter: Secondary | ICD-10-CM | POA: Diagnosis not present

## 2020-05-18 DIAGNOSIS — J45909 Unspecified asthma, uncomplicated: Secondary | ICD-10-CM | POA: Diagnosis not present

## 2020-05-18 MED ORDER — OXYCODONE-ACETAMINOPHEN 5-325 MG PO TABS
2.0000 | ORAL_TABLET | ORAL | 0 refills | Status: DC | PRN
Start: 2020-05-18 — End: 2020-08-21

## 2020-05-18 MED ORDER — OXYCODONE-ACETAMINOPHEN 5-325 MG PO TABS
2.0000 | ORAL_TABLET | Freq: Once | ORAL | Status: AC
  Administered 2020-05-18: 2 via ORAL
  Filled 2020-05-18: qty 2

## 2020-05-18 MED ORDER — METHYLPREDNISOLONE 4 MG PO TBPK: ORAL_TABLET | ORAL | 0 refills | Status: DC

## 2020-05-18 NOTE — ED Provider Notes (Signed)
[facility]-EMERGENCY DEPT Provider Note   CSN: [id number] Arrival date & time: 05/18/20  2032     History Chief Complaint  Patient presents with  . Knee Pain  . Leg Pain    Right knee and leg    Robert Stokes is a 30 y.o. male.  30 year old male presents with acute onset of right-sided knee pain.  Denies any history of trauma.  States he does stand at work for prolonged periods of time.  Denies any fever or chills.  States the pain is sharp and worse with standing.  Pain better with being off of his feet.  No prior history of same        Past Medical History:  Diagnosis Date  . Asthma   . Obesity     Patient Active Problem List   Diagnosis Date Noted  . Moderate left ankle sprain 01/13/2019    Past Surgical History:  Procedure Laterality Date  . ADENOIDECTOMY    . TONSILLECTOMY         History reviewed. No pertinent family history.  Social History   Tobacco Use  . Smoking status: Never Smoker  . Smokeless tobacco: Never Used  Substance Use Topics  . Alcohol use: No  . Drug use: No    Home Medications Prior to Admission medications   Medication Sig Start Date End Date Taking? Authorizing Provider  ibuprofen (ADVIL) 800 MG tablet Take 1 tablet (800 mg total) by mouth 3 (three) times daily. 01/11/19   Roxy Horseman, PA-C  indomethacin (INDOCIN) 25 MG capsule Take 25 mg by mouth 3 (three) times daily. 11/21/19   [provider]  methylPREDNISolone (MEDROL DOSEPAK) 4 MG TBPK tablet 6 day dose pack - take as directed 11/27/19   Edwin Cap, DPM  NON FORMULARY Apply 1 application topically daily as needed ("essential oils").    [provider]    Allergies    Patient has no known allergies.  Review of Systems   Review of Systems  All other systems reviewed and are negative.   Physical Exam Updated Vital Signs BP (!) 146/92 (BP Location: Left Arm)   Pulse 81   Temp 98.4 F (36.9 C) (Oral)   Resp 17    Ht 1.829 m (6')   Wt 136.1 kg   SpO2 97%   BMI 40.69 kg/m   Physical Exam Vitals and nursing note reviewed.  Constitutional:      General: He is not in acute distress.    Appearance: Normal appearance. He is well-developed and well-nourished. He is not toxic-appearing.  HENT:     Head: Normocephalic and atraumatic.  Eyes:     General: Lids are normal.     Extraocular Movements: EOM normal.     Conjunctiva/sclera: Conjunctivae normal.     Pupils: Pupils are equal, round, and reactive to light.  Neck:     Thyroid: No thyroid mass.     Trachea: No tracheal deviation.  Cardiovascular:     Rate and Rhythm: Normal rate and regular rhythm.     Heart sounds: Normal heart sounds. No murmur heard. No gallop.   Pulmonary:     Effort: Pulmonary effort is normal. No respiratory distress.     Breath sounds: Normal breath sounds. No stridor. No decreased breath sounds, wheezing, rhonchi or rales.  Abdominal:     General: Bowel sounds are normal. There is no distension.     Palpations: Abdomen is soft.     Tenderness:  There is no abdominal tenderness. There is no CVA tenderness or rebound.  Musculoskeletal:        General: No edema. Normal range of motion.     Cervical back: Normal range of motion and neck supple.     Right knee: No effusion. Tenderness present over the medial joint line.       Legs:  Skin:    General: Skin is warm and dry.     Findings: No abrasion or rash.  Neurological:     Mental Status: He is alert and oriented to person, place, and time.     GCS: GCS eye subscore is 4. GCS verbal subscore is 5. GCS motor subscore is 6.     Cranial Nerves: No cranial nerve deficit.     Sensory: No sensory deficit.     Deep Tendon Reflexes: Strength normal.  Psychiatric:        Mood and Affect: Mood and affect normal.        Speech: Speech normal.        Behavior: Behavior normal.     ED Results / Procedures / Treatments   Labs (all labs ordered are listed, but only  abnormal results are displayed) Labs Reviewed - No data to display  EKG None  Radiology No results found.  Procedures Procedures   Medications Ordered in ED Medications  oxyCODONE-acetaminophen (PERCOCET/ROXICET) 5-325 MG per tablet 2 tablet (has no administration in time range)    ED Course  I have reviewed the triage vital signs and the nursing notes.  Pertinent labs & imaging results that were available during my care of the patient were reviewed by me and considered in my medical decision making (see chart for details).    MDM Rules/Calculators/A&P                          Patient medicated for pain here and feels better.  X-rays of knee negative.  Will refer to orthopedics Final Clinical Impression(s) / ED Diagnoses Final diagnoses:  None    Rx / DC Orders ED Discharge Orders    None       Lorre Nick, MD 05/18/20 2224

## 2020-05-18 NOTE — ED Notes (Signed)
Patient to xray.

## 2020-05-18 NOTE — ED Triage Notes (Signed)
Pt comes in complaining of right knee pain. Pt describes the pain as squeezing and shooting down his right leg. Pt reports the pain started around 8:30am and has gotten worse throughout the day. Pt reports having pain in that knee in the past but no injuries were found. Pt cannot put weight on right leg.

## 2020-06-10 ENCOUNTER — Other Ambulatory Visit: Payer: Self-pay | Admitting: Sports Medicine

## 2020-06-10 DIAGNOSIS — M25561 Pain in right knee: Secondary | ICD-10-CM

## 2020-06-10 DIAGNOSIS — M79671 Pain in right foot: Secondary | ICD-10-CM

## 2020-07-04 ENCOUNTER — Other Ambulatory Visit: Payer: Medicare Other

## 2020-07-04 ENCOUNTER — Ambulatory Visit
Admission: RE | Admit: 2020-07-04 | Discharge: 2020-07-04 | Disposition: A | Discharge status: Home / Self Care | Payer: Medicare Other | Source: Ambulatory Visit | Attending: Sports Medicine | Admitting: Sports Medicine

## 2020-07-04 ENCOUNTER — Other Ambulatory Visit: Payer: Self-pay

## 2020-07-04 DIAGNOSIS — M25561 Pain in right knee: Secondary | ICD-10-CM

## 2020-07-04 DIAGNOSIS — M79671 Pain in right foot: Secondary | ICD-10-CM

## 2020-08-21 ENCOUNTER — Ambulatory Visit (INDEPENDENT_AMBULATORY_CARE_PROVIDER_SITE_OTHER): Payer: Medicare Other | Admitting: Family Medicine

## 2020-08-21 ENCOUNTER — Encounter (INDEPENDENT_AMBULATORY_CARE_PROVIDER_SITE_OTHER): Payer: Self-pay | Admitting: Family Medicine

## 2020-08-21 ENCOUNTER — Other Ambulatory Visit: Payer: Self-pay

## 2020-08-21 VITALS — BP 118/80 | HR 72 | Temp 97.5°F | Ht 71.0 in | Wt >= 6400 oz

## 2020-08-21 DIAGNOSIS — R739 Hyperglycemia, unspecified: Secondary | ICD-10-CM

## 2020-08-21 DIAGNOSIS — Z0289 Encounter for other administrative examinations: Secondary | ICD-10-CM

## 2020-08-21 DIAGNOSIS — E559 Vitamin D deficiency, unspecified: Secondary | ICD-10-CM

## 2020-08-21 DIAGNOSIS — R0602 Shortness of breath: Secondary | ICD-10-CM

## 2020-08-21 DIAGNOSIS — Z6841 Body Mass Index (BMI) 40.0 and over, adult: Secondary | ICD-10-CM

## 2020-08-21 DIAGNOSIS — R5383 Other fatigue: Secondary | ICD-10-CM | POA: Diagnosis not present

## 2020-08-21 DIAGNOSIS — Z1331 Encounter for screening for depression: Secondary | ICD-10-CM

## 2020-08-22 LAB — CBC WITH DIFFERENTIAL
Basophils Absolute: 0 10*3/uL (ref 0.0–0.2)
Basos: 1 %
EOS (ABSOLUTE): 0.3 10*3/uL (ref 0.0–0.4)
Eos: 3 %
Hematocrit: 43.5 % (ref 37.5–51.0)
Hemoglobin: 13.7 g/dL (ref 13.0–17.7)
Immature Grans (Abs): 0 10*3/uL (ref 0.0–0.1)
Immature Granulocytes: 0 %
Lymphocytes Absolute: 1.8 10*3/uL (ref 0.7–3.1)
Lymphs: 20 %
MCH: 27.5 pg (ref 26.6–33.0)
MCHC: 31.5 g/dL (ref 31.5–35.7)
MCV: 87 fL (ref 79–97)
Monocytes Absolute: 0.8 10*3/uL (ref 0.1–0.9)
Monocytes: 9 %
Neutrophils Absolute: 5.9 10*3/uL (ref 1.4–7.0)
Neutrophils: 67 %
RBC: 4.98 x10E6/uL (ref 4.14–5.80)
RDW: 13.3 % (ref 11.6–15.4)
WBC: 8.8 10*3/uL (ref 3.4–10.8)

## 2020-08-22 LAB — HEMOGLOBIN A1C
Est. average glucose Bld gHb Est-mCnc: 97 mg/dL
Hgb A1c MFr Bld: 5 % (ref 4.8–5.6)

## 2020-08-22 LAB — TSH: TSH: 2.87 u[IU]/mL (ref 0.450–4.500)

## 2020-08-22 LAB — COMPREHENSIVE METABOLIC PANEL
ALT: 29 IU/L (ref 0–44)
AST: 18 IU/L (ref 0–40)
Albumin/Globulin Ratio: 1.5 (ref 1.2–2.2)
Albumin: 4.3 g/dL (ref 4.0–5.0)
Alkaline Phosphatase: 109 IU/L (ref 44–121)
BUN/Creatinine Ratio: 26 — ABNORMAL HIGH (ref 9–20)
BUN: 17 mg/dL (ref 6–20)
Bilirubin Total: 0.4 mg/dL (ref 0.0–1.2)
CO2: 25 mmol/L (ref 20–29)
Calcium: 9.6 mg/dL (ref 8.7–10.2)
Chloride: 98 mmol/L (ref 96–106)
Creatinine, Ser: 0.66 mg/dL — ABNORMAL LOW (ref 0.76–1.27)
Globulin, Total: 2.8 g/dL (ref 1.5–4.5)
Glucose: 97 mg/dL (ref 65–99)
Potassium: 4.4 mmol/L (ref 3.5–5.2)
Sodium: 138 mmol/L (ref 134–144)
Total Protein: 7.1 g/dL (ref 6.0–8.5)
eGFR: 128 mL/min/{1.73_m2} (ref >59)

## 2020-08-22 LAB — T4: T4, Total: 8.7 ug/dL (ref 4.5–12.0)

## 2020-08-22 LAB — T3: T3, Total: 176 ng/dL (ref 71–180)

## 2020-08-22 LAB — INSULIN, RANDOM: INSULIN: 23.4 u[IU]/mL (ref 2.6–24.9)

## 2020-08-22 LAB — LIPID PANEL WITH LDL/HDL RATIO
Cholesterol, Total: 184 mg/dL (ref 100–199)
HDL: 39 mg/dL — ABNORMAL LOW (ref >39)
LDL Chol Calc (NIH): 126 mg/dL — ABNORMAL HIGH (ref 0–99)
LDL/HDL Ratio: 3.2 ratio (ref 0.0–3.6)
Triglycerides: 104 mg/dL (ref 0–149)
VLDL Cholesterol Cal: 19 mg/dL (ref 5–40)

## 2020-08-22 LAB — VITAMIN D 25 HYDROXY (VIT D DEFICIENCY, FRACTURES): Vit D, 25-Hydroxy: 12.2 ng/mL — ABNORMAL LOW (ref 30.0–100.0)

## 2020-08-27 DIAGNOSIS — E559 Vitamin D deficiency, unspecified: Secondary | ICD-10-CM | POA: Insufficient documentation

## 2020-08-27 DIAGNOSIS — R739 Hyperglycemia, unspecified: Secondary | ICD-10-CM | POA: Insufficient documentation

## 2020-08-27 DIAGNOSIS — R5383 Other fatigue: Secondary | ICD-10-CM | POA: Insufficient documentation

## 2020-08-27 DIAGNOSIS — R0602 Shortness of breath: Secondary | ICD-10-CM | POA: Insufficient documentation

## 2020-08-27 NOTE — Progress Notes (Signed)
Chief Complaint:   OBESITY Robert Stokes (MR# [id number]) is a 30 y.o. male who presents for evaluation and treatment of obesity and related comorbidities. Current BMI is Body mass index is 57.32 kg/m. Robert Stokes has been struggling with his weight for many years and has been unsuccessful in either losing weight, maintaining weight loss, or reaching his healthy weight goal.  Robert Stokes is currently in the action stage of change and ready to dedicate time achieving and maintaining a healthier weight. Robert Stokes is interested in becoming our patient and working on intensive lifestyle modifications including (but not limited to) diet and exercise for weight loss.  Robert Stokes's habits were reviewed today and are as follows: he struggles with family and or coworkers weight loss sabotage, his desired weight loss is 212 lbs, he has been heavy most of his life, he started gaining weight when he was 6, his heaviest weight ever was 411 pounds, he has significant food cravings issues, he wakes up frequently in the middle of the night to eat, he skips meals frequently, he is frequently drinking liquids with calories, he frequently makes poor food choices, he frequently eats larger portions than normal and he struggles with emotional eating.  Depression Screen Robert Stokes's Food and Mood (modified PHQ-9) score was 27.  Depression screen Robert Stokes 2/9 08/21/2020  Decreased Interest 0  Down, Depressed, Hopeless 2  PHQ - 2 Score 2  Altered sleeping 1  Tired, decreased energy 3  Change in appetite 2  Feeling bad or failure about yourself  0  Trouble concentrating 0  Moving slowly or fidgety/restless 0  Suicidal thoughts 0  PHQ-9 Score 8   Subjective:   1. Other fatigue Robert Stokes admits to daytime somnolence and admits to waking up still tired. Patent has a history of symptoms of daytime fatigue and morning headache. Robert Stokes generally gets 3 or 5 hours of sleep per night, and states that he has nightime awakenings. Snoring is  present. Apneic episodes are present. Epworth Sleepiness Score is 8.  2. SOB (shortness of breath) on exertion Robert Stokes notes shortness of breath and bilateral edema. His EKG shows possible left ventricular hypertrophy.  3. Hyperglycemia Robert Stokes notes morning anorexia and PM polyphagia. He has a history of some elevated glucose readings in the past, and no recent A1c.  4. Vitamin D deficiency Robert Stokes is at very high risk of low Vit D, and he notes fatigue.  Assessment/Plan:   1. Other fatigue Robert Stokes does feel that his weight is causing his energy to be lower than it should be. Fatigue may be related to obesity, depression or many other causes. Labs will be ordered, and in the meanwhile, Robert Stokes will focus on self care including making healthy food choices, increasing physical activity and focusing on stress reduction.  - TSH - Lipid Panel With LDL/HDL Ratio - T3 - T4 - CBC With Differential - EKG 12-Lead  2. SOB (shortness of breath) on exertion We will schedule Robert Stokes for and echocardiogram. Will continue to monitor closely.  - ECHOCARDIOGRAM COMPLETE  3. Hyperglycemia Fasting labs will be obtained today, and results with be discussed with Robert Stokes in 2 weeks at his follow up visit. In the meanwhile Robert Stokes will start on his Category 4 plan and will work on weight loss efforts.  - Insulin, random - Comprehensive metabolic panel - Hemoglobin A1c  4. Vitamin D deficiency Low Vitamin D level contributes to fatigue and are associated with obesity, breast, and colon cancer. We will check labs today. Robert Stokes  will follow-up for routine testing of Vitamin D, at least 2-3 times per year to avoid over-replacement.  - VITAMIN D 25 Hydroxy (Vit-D Deficiency, Fractures)  5. Screening for depression Robert Stokes had a positive depression screening. Depression is commonly associated with obesity and often results in emotional eating behaviors. We will monitor this closely and work on CBT to help improve  the non-hunger eating patterns. Referral to Psychology may be required if no improvement is seen as he continues in our clinic.  6. Obesity with BMI 57.6 Robert Stokes is currently in the action stage of change and his goal is to continue with weight loss efforts. I recommend Robert Stokes begin the structured treatment plan as follows:  He has agreed to the Category 4 Plan.  Exercise goals: No exercise has been prescribed for now while we concentrate on nutritional changes.  Behavioral modification strategies: increasing lean protein intake and no skipping meals.  He was informed of the importance of frequent follow-up visits to maximize his success with intensive lifestyle modifications for his multiple health conditions. He was informed we would discuss his lab results at his next visit unless there is a critical issue that needs to be addressed sooner. Robert Stokes agreed to keep his next visit at the agreed upon time to discuss these results.  Objective:   Blood pressure 118/80, pulse 72, temperature (!) 97.5 F (36.4 C), height 5\' 11"  (1.803 m), SpO2 96 %. Body mass index is 57.32 kg/m.  EKG: Normal sinus rhythm, rate 68 BPM.  Indirect Calorimeter completed today shows a VO2 of 470 and a REE of 3270.  His calculated basal metabolic rate is thus his basal metabolic rate is better than expected.  General: Cooperative, alert, well developed, in no acute distress. HEENT: Conjunctivae and lids unremarkable. Cardiovascular: Regular rhythm.  Lungs: Normal work of breathing. Neurologic: No focal deficits.   Lab Results  Component Value Date   CREATININE 0.66 (L) 08/21/2020   BUN 17 08/21/2020   NA 138 08/21/2020   K 4.4 08/21/2020   CL 98 08/21/2020   CO2 25 08/21/2020   Lab Results  Component Value Date   ALT 29 08/21/2020   AST 18 08/21/2020   ALKPHOS 109 08/21/2020   BILITOT 0.4 08/21/2020   Lab Results  Component Value Date   HGBA1C 5.0 08/21/2020   Lab Results  Component Value  Date   INSULIN 23.4 08/21/2020   Lab Results  Component Value Date   TSH 2.870 08/21/2020   Lab Results  Component Value Date   CHOL 184 08/21/2020   HDL 39 (L) 08/21/2020   LDLCALC 126 (H) 08/21/2020   TRIG 104 08/21/2020   Lab Results  Component Value Date   WBC 8.8 08/21/2020   HGB 13.7 08/21/2020   HCT 43.5 08/21/2020   MCV 87 08/21/2020   PLT 215 11/24/2019   No results found for: IRON, TIBC, FERRITIN  Attestation Statements:   Reviewed by clinician on day of visit: allergies, medications, problem list, medical history, surgical history, family history, social history, and previous encounter notes.  Time spent on visit including pre-visit chart review and post-visit charting and care was 65 minutes.    I, 11/26/2019, am acting as transcriptionist for Burt Knack, MD.  I have reviewed the above documentation for accuracy and completeness, and I agree with the above. - **Quillian Quince, MD *

## 2020-09-01 ENCOUNTER — Emergency Department (HOSPITAL_COMMUNITY): Payer: Medicare Other

## 2020-09-01 ENCOUNTER — Emergency Department (HOSPITAL_BASED_OUTPATIENT_CLINIC_OR_DEPARTMENT_OTHER): Payer: Medicare Other

## 2020-09-01 ENCOUNTER — Encounter (HOSPITAL_COMMUNITY): Payer: Self-pay

## 2020-09-01 ENCOUNTER — Emergency Department (HOSPITAL_COMMUNITY)
Admission: EM | Admit: 2020-09-01 | Discharge: 2020-09-01 | Disposition: A | Discharge status: Home / Self Care | Payer: Medicare Other | Attending: Emergency Medicine | Admitting: Emergency Medicine

## 2020-09-01 DIAGNOSIS — M7989 Other specified soft tissue disorders: Secondary | ICD-10-CM | POA: Diagnosis not present

## 2020-09-01 DIAGNOSIS — R0602 Shortness of breath: Secondary | ICD-10-CM | POA: Diagnosis not present

## 2020-09-01 DIAGNOSIS — M25571 Pain in right ankle and joints of right foot: Secondary | ICD-10-CM | POA: Insufficient documentation

## 2020-09-01 DIAGNOSIS — R600 Localized edema: Secondary | ICD-10-CM | POA: Diagnosis not present

## 2020-09-01 DIAGNOSIS — J45909 Unspecified asthma, uncomplicated: Secondary | ICD-10-CM | POA: Insufficient documentation

## 2020-09-01 DIAGNOSIS — M79671 Pain in right foot: Secondary | ICD-10-CM

## 2020-09-01 LAB — CBC WITH DIFFERENTIAL/PLATELET
Abs Immature Granulocytes: 0.03 10*3/uL (ref 0.00–0.07)
Basophils Absolute: 0 10*3/uL (ref 0.0–0.1)
Basophils Relative: 0 %
Eosinophils Absolute: 0.4 10*3/uL (ref 0.0–0.5)
Eosinophils Relative: 5 %
HCT: 45.7 % (ref 39.0–52.0)
Hemoglobin: 14.3 g/dL (ref 13.0–17.0)
Immature Granulocytes: 0 %
Lymphocytes Relative: 23 %
Lymphs Abs: 2.1 10*3/uL (ref 0.7–4.0)
MCH: 27.6 pg (ref 26.0–34.0)
MCHC: 31.3 g/dL (ref 30.0–36.0)
MCV: 88.2 fL (ref 80.0–100.0)
Monocytes Absolute: 0.7 10*3/uL (ref 0.1–1.0)
Monocytes Relative: 8 %
Neutro Abs: 5.9 10*3/uL (ref 1.7–7.7)
Neutrophils Relative %: 64 %
Platelets: 246 10*3/uL (ref 150–400)
RBC: 5.18 MIL/uL (ref 4.22–5.81)
RDW: 13.5 % (ref 11.5–15.5)
WBC: 9.2 10*3/uL (ref 4.0–10.5)
nRBC: 0 % (ref 0.0–0.2)

## 2020-09-01 LAB — BASIC METABOLIC PANEL
Anion gap: 10 (ref 5–15)
BUN: 15 mg/dL (ref 6–20)
CO2: 27 mmol/L (ref 22–32)
Calcium: 9.4 mg/dL (ref 8.9–10.3)
Chloride: 102 mmol/L (ref 98–111)
Creatinine, Ser: 0.76 mg/dL (ref 0.61–1.24)
GFR, Estimated: >60 mL/min (ref >60)
Glucose, Bld: 96 mg/dL (ref 70–99)
Potassium: 3.8 mmol/L (ref 3.5–5.1)
Sodium: 139 mmol/L (ref 135–145)

## 2020-09-01 LAB — TROPONIN I (HIGH SENSITIVITY): Troponin I (High Sensitivity): 4 ng/L (ref <18)

## 2020-09-01 LAB — BRAIN NATRIURETIC PEPTIDE: B Natriuretic Peptide: 37.9 pg/mL (ref 0.0–100.0)

## 2020-09-01 LAB — D-DIMER, QUANTITATIVE: D-Dimer, Quant: 0.29 ug/mL-FEU (ref 0.00–0.50)

## 2020-09-01 MED ORDER — MELOXICAM 15 MG PO TABS: 15.0000 mg | ORAL_TABLET | Freq: Every day | ORAL | 0 refills | Status: DC

## 2020-09-01 NOTE — ED Notes (Signed)
Xray to bedside.

## 2020-09-01 NOTE — ED Provider Notes (Signed)
Emergency Medicine Provider Triage Evaluation Note  FARAH BENISH , a 30 y.o. male  was evaluated in triage.  Pt complains of shortness of breath that worsened today while at work.  Reports ankle pain and swelling for the past 2 days without any injury or trauma.  Denies any chest pain, cough, history of DVT, PE or MI.  He does have a history of asthma but states that this feels different.  Review of Systems  Positive: Right leg pain, shortness of breath Negative: Chest pain  Physical Exam  BP (!) 184/128   Pulse 77   Temp 97.9 F (36.6 C) (Oral)   Resp 14   SpO2 97%  Gen:   Awake, no distress   Resp:  Normal effort  MSK:   Moves extremities without difficulty  Other:  2+ DP pulse palpated on the right side.  Pain with palpation of the ankle without any deformities.  No significant erythema  Medical Decision Making  Medically screening exam initiated at 12:17 PM.  Appropriate orders placed.  BRAYTON BAUMGARTNER was informed that the remainder of the evaluation will be completed by another provider, this initial triage assessment does not replace that evaluation, and the importance of remaining in the ED until their evaluation is complete.  Order lab work, EKG, chest x-ray and Doppler study to rule out DVT of the right lower Ocie Doyne, PA-C 09/01/20 1218    Gerhard Munch, MD 09/01/20 [phone]

## 2020-09-01 NOTE — Progress Notes (Signed)
Lower extremity venous has been completed.   Preliminary results in CV Proc.   Blanch Media 09/01/2020 1:05 PM

## 2020-09-01 NOTE — ED Notes (Signed)
Ace wrap applied to right ankle/foot

## 2020-09-01 NOTE — ED Triage Notes (Signed)
Patient reports shortness of breath and right ankle pain X2 days.   Patient states he feels like his right ankle " is about to bust" pedal pulses intact.   Denies chest pain   A/OX4 Wheelchair in triage

## 2020-09-01 NOTE — Discharge Instructions (Addendum)
1) it is imperative that you find a primary care doctor.  If you do not have a primary Care doctor, there is a 1 800 number in your discharge paperwork.  [facility] can help you establish care with a primary care doctor.  2) it does not appear that you have any emergent cause of your shortness of breath.  Shortness of breath can be caused by a multitude of issues.  Please follow-up for evaluation of your shortness of breath.  It is best to follow-up with a primary care physician however since she may not have 1 you may follow-up with [facility].  3) there are also no acute findings on your ankle and foot film.  This is likely due to strain on your ankle, standing for long periods of time, increased weight, and flat feet.  If you are able to find compression stockings that would be helpful to help reduce the amount of swelling in your legs.  It is also important to rest your legs above your heart when you sleep in order to help decrease the amount of swelling.  Please follow-up with the podiatrist for further evaluation of your foot and ankle pain.  I have ordered medication to help with your pain symptoms.  It may also be helpful to ice your ankle several times a day.  Get help right away if: Your shortness of breath gets worse. You have shortness of breath when you are resting. You feel light-headed or you faint. You have a cough that is not controlled with medicines. You cough up blood. You have pain with breathing. You have pain in your chest, arms, shoulders, or abdomen. You have a fever. You cannot walk up stairs or exercise the way that you normally do.

## 2020-09-01 NOTE — ED Provider Notes (Signed)
[facility]-EMERGENCY DEPT Provider Note   CSN: [id number] Arrival date & time: 09/01/20  1205     History Chief Complaint  Patient presents with  . Shortness of Breath  . Ankle Pain    right    Robert Stokes is a 30 y.o. male who presents emergency department with a chief complaint of ankle pain.  Has a history of morbid obesity.  He is currently under treatment with [facility].  He was seen on 5/25 and had a fairly extensive evaluation.  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  He states that he is flat-footed and frequently has medial ankle pain however currently is complaining of severe pain in the right ankle.  He denies any injury.  He has pain medially and laterally.  He states it feels like someone is stabbing him worse when he tries to bear weight or move the ankle.  Better with rest.  He has tried prescribed anti-inflammatory medications without relief.  Also complains of bilateral ankle swelling and shortness of breath which is worse with exertion.  Patient states that his symptoms began this past Friday, 2 days ago.  He feels like someone "Darth Vader'd" his chest by reaching into it grabbing it and squeezing.  He states that since that time he has felt very short of breath both at rest and with exertion.  Although he tells me it only started 2 days ago it does appear that he had a similar complaint at his last office visit on 08/21/2020.  Patient was seen in triage and had an initial work-up that included a Doppler ultrasound of the right lower extremity that showed no evidence of DVT.  Current labs including CBC and BMP showed acute abnormalities.  I have ordered x-ray of the right ankle, BNP, D-dimer and troponin.  HPI     Past Medical History:  Diagnosis Date  . ADD (attention deficit disorder)   . ADHD   . Asthma   . Back pain   . Joint pain   . Obesity   . Sleep  apnea   . SOB (shortness of breath)   . Swelling of lower extremity     Patient Active Problem List   Diagnosis Date Noted  . Other fatigue 08/27/2020  . Hyperglycemia 08/27/2020  . SOB (shortness of breath) on exertion 08/27/2020  . Vitamin D deficiency 08/27/2020  . Moderate left ankle sprain 01/13/2019    Past Surgical History:  Procedure Laterality Date  . ADENOIDECTOMY    . TONSILLECTOMY         Family History  Problem Relation Age of Onset  . Diabetes Mother   . Hypertension Mother   . Heart failure Mother   . Cancer Mother   . Depression Mother   . Anxiety disorder Mother   . Liver cancer Mother   . Sleep apnea Mother   . Alcoholism Mother   . Drug abuse Mother   . Cancer Father   . Depression Father   . Anxiety disorder Father   . Sleep apnea Father   . Alcoholism Father   . Drug abuse Father     Social History   Tobacco Use  . Smoking status: Never Smoker  . Smokeless tobacco: Never Used  Vaping Use  . Vaping Use: Never used  Substance Use Topics  . Alcohol use: Yes    Alcohol/week: 2.0 standard drinks  Types: 2 Cans of beer per week    Comment: A MONTH  . Drug use: No    Home Medications Prior to Admission medications   Medication Sig Start Date End Date Taking? Authorizing Provider  diclofenac (CATAFLAM) 50 MG tablet Take 50 mg by mouth 3 (three) times daily.    [provider]  ibuprofen (ADVIL) 800 MG tablet Take 1 tablet (800 mg total) by mouth 3 (three) times daily. 01/11/19   Roxy Horseman, PA-C  indomethacin (INDOCIN) 25 MG capsule Take 25 mg by mouth 3 (three) times daily. 11/21/19   [provider]    Allergies    Patient has no known allergies.  Review of Systems   Review of Systems Ten systems reviewed and are negative for acute change, except as noted in the HPI.   Physical Exam Updated Vital Signs BP (!) 189/105 (BP Location: Right Arm)   Pulse 87   Temp 97.9 F (36.6 C) (Oral)   Resp 16   SpO2  98%   Physical Exam Vitals and nursing note reviewed.  Constitutional:      General: He is not in acute distress.    Appearance: He is well-developed. He is obese. He is not diaphoretic.  HENT:     Head: Normocephalic and atraumatic.  Eyes:     General: No scleral icterus.    Conjunctiva/sclera: Conjunctivae normal.  Cardiovascular:     Rate and Rhythm: Normal rate and regular rhythm.     Heart sounds: Normal heart sounds.  Pulmonary:     Effort: Pulmonary effort is normal. No respiratory distress.     Breath sounds: Normal breath sounds.  Abdominal:     Palpations: Abdomen is soft.     Tenderness: There is no abdominal tenderness.  Musculoskeletal:     Cervical back: Normal range of motion and neck supple.     Right lower leg: Edema present.     Left lower leg: Edema present.  Skin:    General: Skin is warm and dry.  Neurological:     Mental Status: He is alert.  Psychiatric:        Behavior: Behavior normal.     ED Results / Procedures / Treatments   Labs (all labs ordered are listed, but only abnormal results are displayed) Labs Reviewed  BASIC METABOLIC PANEL  CBC WITH DIFFERENTIAL/PLATELET  BRAIN NATRIURETIC PEPTIDE  D-DIMER, QUANTITATIVE  TROPONIN I (HIGH SENSITIVITY)    EKG None ECG interpretation   Date: 09/01/2020  Rate: 73  Rhythm: normal sinus rhythm  QRS Axis: normal  Intervals: normal  ST/T Wave abnormalities: normal  Conduction Disutrbances: none  Narrative Interpretation:   Old EKG Reviewed:     Radiology DG Chest 2 View  Result Date: 09/01/2020 CLINICAL DATA:  RIGHT ankle pain.  Short of breath. EXAM: CHEST - 2 VIEW COMPARISON:  None. FINDINGS: Normal mediastinum and cardiac silhouette. Normal pulmonary vasculature. No evidence of effusion, infiltrate, or pneumothorax. No acute bony abnormality. IMPRESSION: No acute cardiopulmonary process. Electronically Signed   By: Genevive Bi M.D.   On: 09/01/2020 13:02   VAS [name] LOWER EXTREMITY  VENOUS (DVT) (ONLY [facility] & [facility] 7a-7p)  Result Date: 09/01/2020  Lower Venous DVT Study Patient Name:  Robert Stokes  Date of Exam:   09/01/2020 Medical Rec #: [id number]       Accession #:    [id number] Date of Birth: 09-Apr-1990        Patient Gender: M Patient Age:  89O Exam Location:  [facility] Procedure:      VAS [name] LOWER EXTREMITY VENOUS (DVT) Referring Phys: [id number] HINA KHATRI --------------------------------------------------------------------------------  Indications: Swelling, and Pain.  Limitations: Body habitus. Comparison Study: no prior Performing Technologist: [place] Ponder RVS  Examination Guidelines: A complete evaluation includes B-mode imaging, spectral Doppler, color Doppler, and power Doppler as needed of all accessible portions of each vessel. Bilateral testing is considered an integral part of a complete examination. Limited examinations for reoccurring indications may be performed as noted. The reflux portion of the exam is performed with the patient in reverse Trendelenburg.  +---------+---------------+---------+-----------+----------+-------------------+ RIGHT    CompressibilityPhasicitySpontaneityPropertiesThrombus Aging      +---------+---------------+---------+-----------+----------+-------------------+ CFV      Full           Yes      Yes                                      +---------+---------------+---------+-----------+----------+-------------------+ SFJ      Full                                                             +---------+---------------+---------+-----------+----------+-------------------+ FV Prox  Full                                                             +---------+---------------+---------+-----------+----------+-------------------+ FV Mid                  Yes      Yes                                      +---------+---------------+---------+-----------+----------+-------------------+ FV Distal               Yes       Yes                                      +---------+---------------+---------+-----------+----------+-------------------+ PFV      Full                                                             +---------+---------------+---------+-----------+----------+-------------------+ POP      Full           Yes      Yes                                      +---------+---------------+---------+-----------+----------+-------------------+ PTV      Full                                                             +---------+---------------+---------+-----------+----------+-------------------+  PERO                                                  Not well visualized +---------+---------------+---------+-----------+----------+-------------------+   +----+---------------+---------+-----------+----------+--------------+ LEFTCompressibilityPhasicitySpontaneityPropertiesThrombus Aging +----+---------------+---------+-----------+----------+--------------+ CFV Full           Yes      Yes                                 +----+---------------+---------+-----------+----------+--------------+    Summary: RIGHT: - There is no evidence of deep vein thrombosis in the lower extremity.  - No cystic structure found in the popliteal fossa.  LEFT: - No evidence of common femoral vein obstruction.  *See table(s) above for measurements and observations.    Preliminary     Procedures Procedures   Medications Ordered in ED Medications - No data to display  ED Course  I have reviewed the triage vital signs and the nursing notes.  Pertinent labs & imaging results that were available during my care of the patient were reviewed by me and considered in my medical decision making (see chart for details).    MDM Rules/Calculators/A&P                          30 year old male with past medical history of obesity.  Complains of right ankle pain and shortness of breath.The emergent differential  diagnosis for shortness of breath includes, but is not limited to, Pulmonary edema, bronchoconstriction, Pneumonia, Pulmonary embolism, Pneumotherax/ Hemothorax, Dysrythmia, ACS.  I ordered and reviewed labs and included BMP CBC troponin and D-dimer all of which are within normal limits.  His blood pressure mildly elevated today which may be secondary to pain or poorly fitting cough as review of EMR shows that his last systolic blood pressure was 118 at last office visit.  I ordered and reviewed images that included 2 view chest x-ray, right foot and ankle exam.  Right foot and ankle are without abnormality.  2 view chest x-ray shows some mild cardiomegaly without evidence of pulmonary edema.  EKG shows normal sinus rhythm at a rate of 73.  Given findings I feel the patient is likely safe for discharge at this time with close outpatient follow-up.  I recommended he get a primary care physician but have given him follow-up with cardiology for evaluation of his shortness of breath which is likely multifactorial.  I have also given him follow-up with podiatry for his foot and ankle pain again also likely multifactorial.  Patient is okay with this plan.  Discharged with Mobic.  He appears otherwise appropriate for discharge at this time Final Clinical Impression(s) / ED Diagnoses Final diagnoses:  None    Rx / DC Orders ED Discharge Orders    None       Arthor CaptainHarris, Ramiyah Mcclenahan, PA-C 09/01/20 1549    Gerhard MunchLockwood, Robert, MD 09/02/20 2319

## 2020-09-04 ENCOUNTER — Encounter (INDEPENDENT_AMBULATORY_CARE_PROVIDER_SITE_OTHER): Payer: Self-pay | Admitting: Family Medicine

## 2020-09-04 ENCOUNTER — Other Ambulatory Visit: Payer: Self-pay

## 2020-09-04 ENCOUNTER — Ambulatory Visit (INDEPENDENT_AMBULATORY_CARE_PROVIDER_SITE_OTHER): Payer: Medicare Other | Admitting: Family Medicine

## 2020-09-04 VITALS — BP 126/73 | HR 72 | Temp 97.8°F | Ht 71.0 in | Wt >= 6400 oz

## 2020-09-04 DIAGNOSIS — E559 Vitamin D deficiency, unspecified: Secondary | ICD-10-CM | POA: Diagnosis not present

## 2020-09-04 DIAGNOSIS — R0602 Shortness of breath: Secondary | ICD-10-CM | POA: Diagnosis not present

## 2020-09-04 DIAGNOSIS — E8881 Metabolic syndrome: Secondary | ICD-10-CM

## 2020-09-04 DIAGNOSIS — E782 Mixed hyperlipidemia: Secondary | ICD-10-CM

## 2020-09-04 DIAGNOSIS — Z6841 Body Mass Index (BMI) 40.0 and over, adult: Secondary | ICD-10-CM | POA: Diagnosis not present

## 2020-09-04 DIAGNOSIS — E88819 Insulin resistance, unspecified: Secondary | ICD-10-CM

## 2020-09-04 MED ORDER — VITAMIN D (ERGOCALCIFEROL) 1.25 MG (50000 UNIT) PO CAPS: 50000.0000 [IU] | ORAL_CAPSULE | ORAL | 0 refills | Status: DC

## 2020-09-12 NOTE — Progress Notes (Signed)
Chief Complaint:   OBESITY Robert Stokes is here to discuss his progress with his obesity treatment plan along with follow-up of his obesity related diagnoses. Robert Stokes is on the Category 4 Plan and states he is following his eating plan approximately 3% of the time. Robert Stokes states he is doing 0 minutes 0 times per week.  Today's visit was #: 2 Starting weight: 412 lbs Starting date: 08/21/2020 Today's weight: 408 lbs Today's date: 09/04/2020 Total lbs lost to date: 4 Total lbs lost since last in-office visit: 4  Interim History: Robert Stokes has done well with his weight loss, but he was unable to follow his plan. He states he wasn't hungry and didn't eat much. He skipped some meals but tried to make healthier choices.   Subjective:   1. Mixed hyperlipidemia Robert Stokes's HDL is low and his LDL is elevated with normal triglycerides. I discussed labs with the patient today.  2. Vitamin D deficiency Robert Stokes's Vit D level is below goal, and he notes fatigue. I discussed labs with the patient today.  3. Insulin resistance Robert Stokes's fasting glucose and A1c are within normal limits, but his fasting insulin is 23 which is 4-5 times higher than ideal. I discussed labs with the patient today.  4. SOB (shortness of breath) on exertion Robert Stokes had an echocardiogram ordered last visit, but he wasn't able to have it done.   Assessment/Plan:   1. Mixed hyperlipidemia Cardiovascular risk and specific lipid/LDL goals reviewed. We discussed several lifestyle modifications today. Robert Stokes will continue to work on diet, exercise and weight loss efforts. Orders and follow up as documented in patient record.   2. Vitamin D deficiency Low Vitamin D level contributes to fatigue and are associated with obesity, breast, and colon cancer. Robert Stokes agreed to start prescription Vitamin D 50,000 IU every week with no refills. He will follow-up for routine testing of Vitamin D, at least 2-3 times per year to avoid  over-replacement.  - Vitamin D, Ergocalciferol, (DRISDOL) 1.25 MG (50000 UNIT) CAPS capsule; Take 1 capsule (50,000 Units total) by mouth every 7 (seven) days.  Dispense: 4 capsule; Refill: 0  3. Insulin resistance Robert Stokes will continue to work on diet, exercise, and decreasing simple carbohydrates to help decrease the risk of diabetes. Robert Stokes agreed to follow-up with [name] as directed to closely monitor his progress.  4. SOB (shortness of breath) on exertion We have refer Robert Stokes for an echocardiogram, and we will follow up and make sure he is scheduled.  - ECHOCARDIOGRAM COMPLETE  5. Obesity with current BMI Of 56.9 Robert Stokes is currently in the action stage of change. As such, his goal is to continue with weight loss efforts. He has agreed to change to keeping a food journal and adhering to recommended goals of 1500-2000 calories and 100+ grams of protein daily.   Behavioral modification strategies: increasing lean protein intake and meal planning and cooking strategies.  Robert Stokes has agreed to follow-up with our clinic in 2 weeks. He was informed of the importance of frequent follow-up visits to maximize his success with intensive lifestyle modifications for his multiple health conditions.   Objective:   Blood pressure 126/73, pulse 72, temperature 97.8 F (36.6 C), height 5\' 11"  (1.803 m), weight (!) 408 lb (185.1 kg), SpO2 96 %. Body mass index is 56.9 kg/m.  General: Cooperative, alert, well developed, in no acute distress. HEENT: Conjunctivae and lids unremarkable. Cardiovascular: Regular rhythm.  Lungs: Normal work of breathing. Neurologic: No focal deficits.   Lab Results  Component Value Date   CREATININE 0.76 09/01/2020   BUN 15 09/01/2020   NA 139 09/01/2020   K 3.8 09/01/2020   CL 102 09/01/2020   CO2 27 09/01/2020   Lab Results  Component Value Date   ALT 29 08/21/2020   AST 18 08/21/2020   ALKPHOS 109 08/21/2020   BILITOT 0.4 08/21/2020   Lab Results  Component  Value Date   HGBA1C 5.0 08/21/2020   Lab Results  Component Value Date   INSULIN 23.4 08/21/2020   Lab Results  Component Value Date   TSH 2.870 08/21/2020   Lab Results  Component Value Date   CHOL 184 08/21/2020   HDL 39 (L) 08/21/2020   LDLCALC 126 (H) 08/21/2020   TRIG 104 08/21/2020   Lab Results  Component Value Date   WBC 9.2 09/01/2020   HGB 14.3 09/01/2020   HCT 45.7 09/01/2020   MCV 88.2 09/01/2020   PLT 246 09/01/2020   No results found for: IRON, TIBC, FERRITIN  Obesity Behavioral Intervention:   Approximately 15 minutes were spent on the discussion below.  ASK: We discussed the diagnosis of obesity with Robert Stokes today and Robert Stokes agreed to give [name] permission to discuss obesity behavioral modification therapy today.  ASSESS: Robert Stokes has the diagnosis of obesity and his BMI today is 56.93. Robert Stokes is in the action stage of change.   ADVISE: Robert Stokes was educated on the multiple health risks of obesity as well as the benefit of weight loss to improve his health. He was advised of the need for long term treatment and the importance of lifestyle modifications to improve his current health and to decrease his risk of future health problems.  AGREE: Multiple dietary modification options and treatment options were discussed and Robert Stokes agreed to follow the recommendations documented in the above note.  ARRANGE: Robert Stokes was educated on the importance of frequent visits to treat obesity as outlined per CMS and USPSTF guidelines and agreed to schedule his next follow up appointment today.  Attestation Statements:   Reviewed by clinician on day of visit: allergies, medications, problem list, medical history, surgical history, family history, social history, and previous encounter notes.   I, Burt Knack, am acting as transcriptionist for Quillian Quince, MD.  I have reviewed the above documentation for accuracy and completeness, and I agree with the above. -  Quillian Quince, MD

## 2020-09-22 IMAGING — CR DG TIBIA/FIBULA 2V*L*
3 series · 3 of 3 positions shown · non-contrast
Comparison: Concurrent ankle radiographs, tibia/fibular radiographs
06/22/2015

CLINICAL DATA: Fall with ankle hyperflexion swollen laterally pain
radiates up the leg.

EXAM:
LEFT TIBIA AND FIBULA - 2 VIEW

[x tib-fib lat left (1 of 2)]
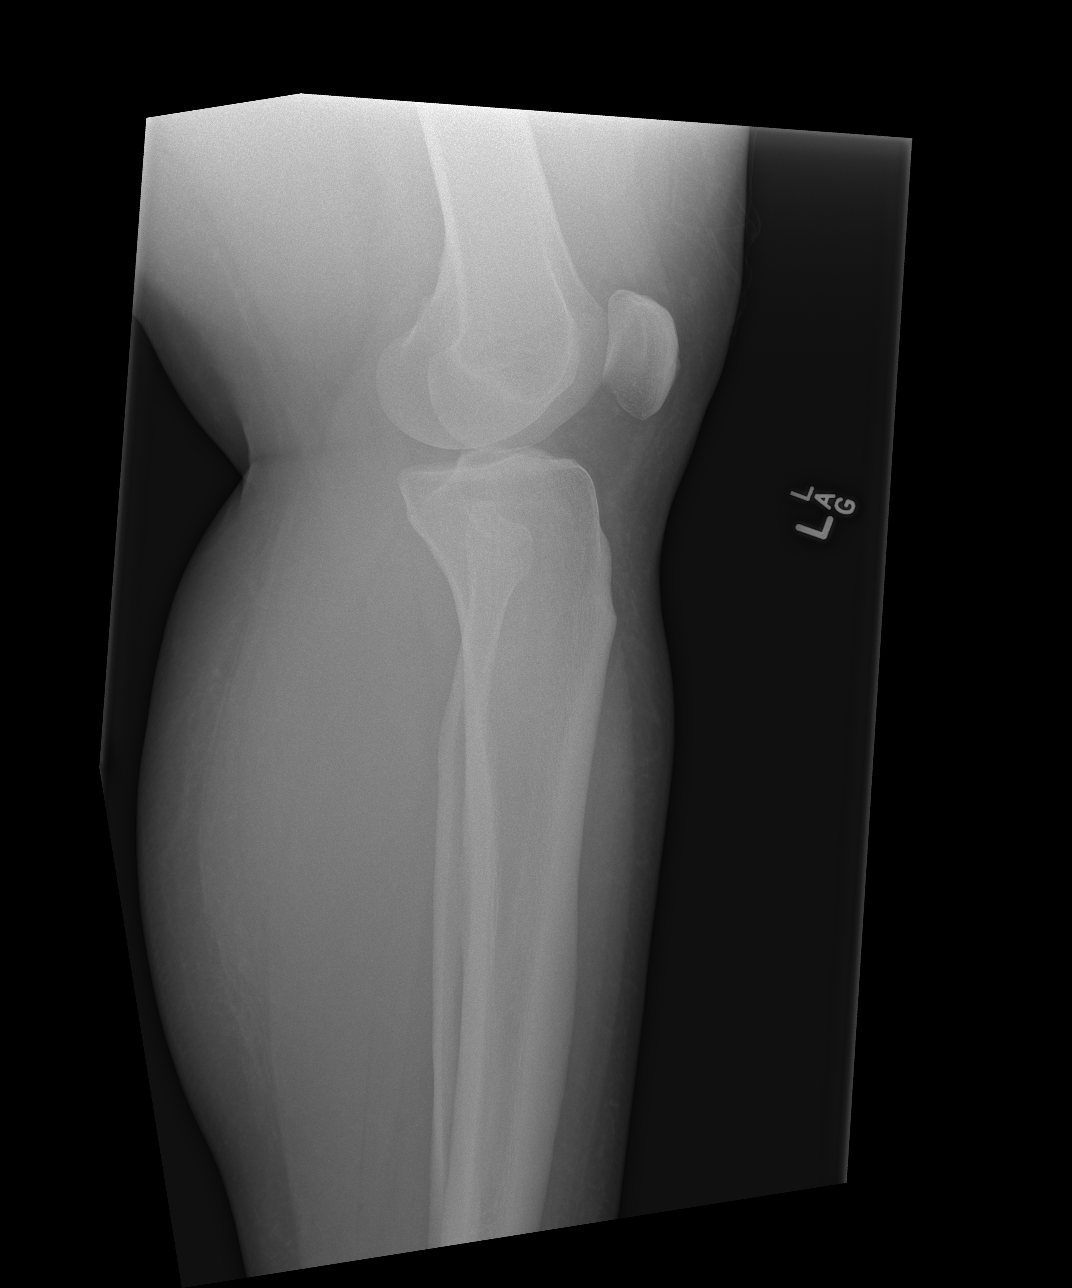

[x tib-fib lat left (2 of 2)]
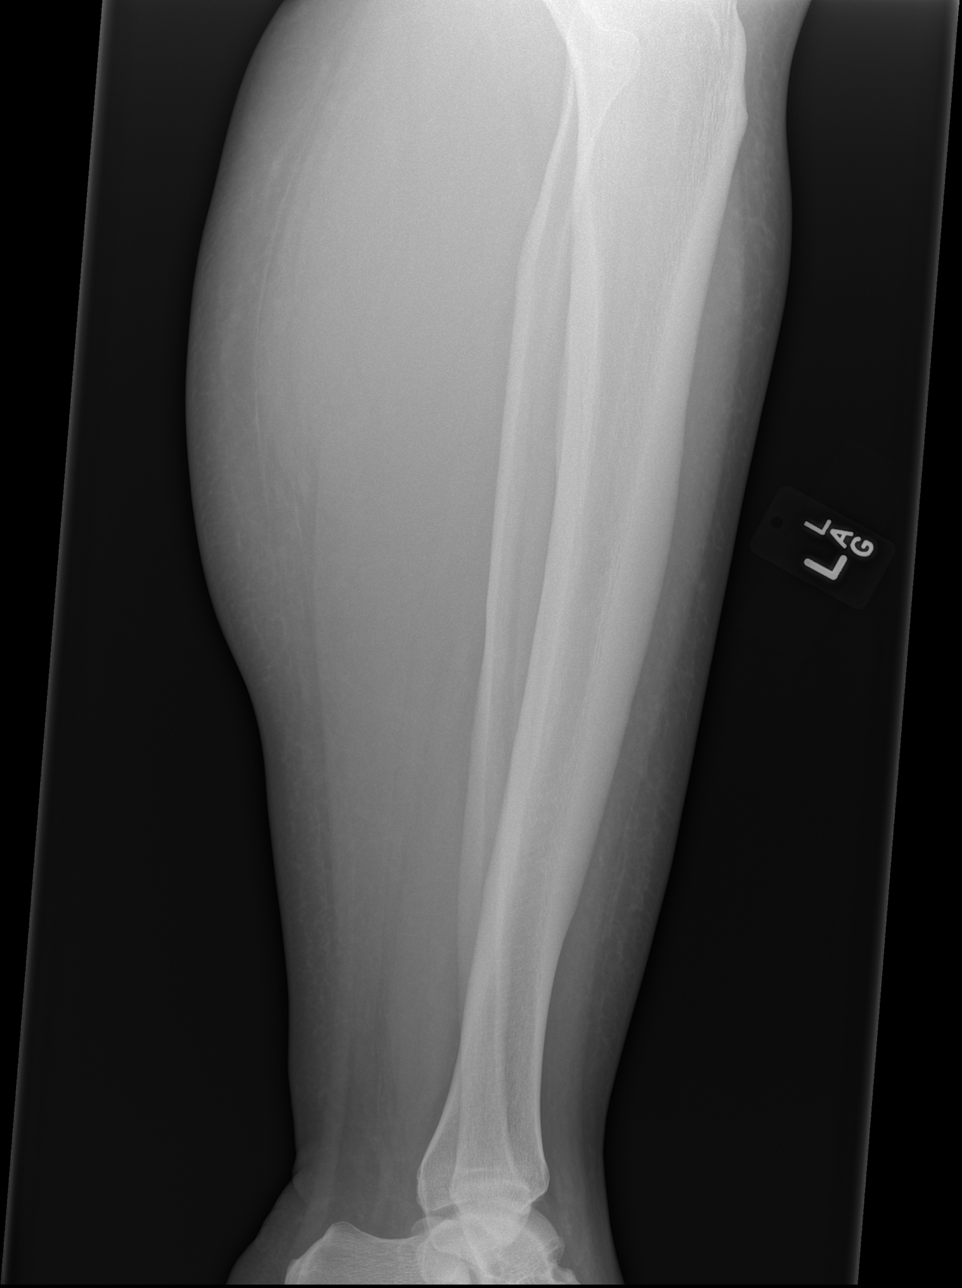

[x tib-fib ap left]
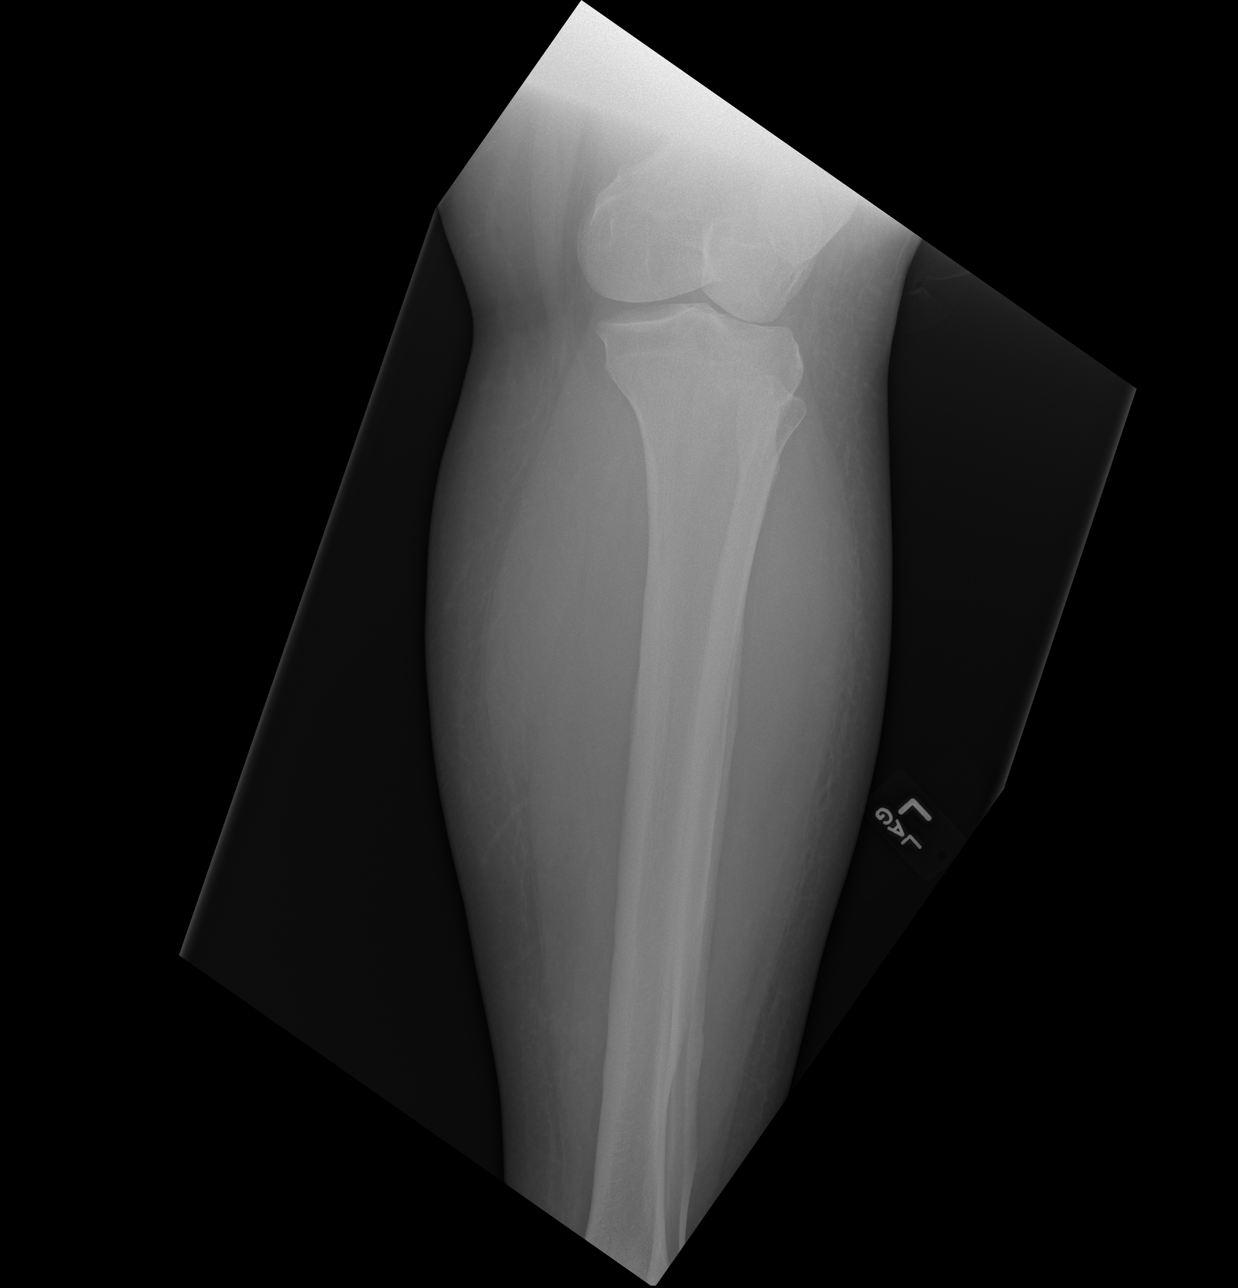

[3 of 3 positions shown; findings below may reference images not displayed]

FINDINGS: Mild soft tissue swelling and edema is seen anterolaterally. There
is suboptimal AP projection of lower leg. No definite fracture or
traumatic malalignment is seen.
IMPRESSION: Suboptimal AP projection.  Consider repeat/additional views.

Soft tissue swelling laterally. No definite fracture or
malalignment.

## 2020-09-24 ENCOUNTER — Ambulatory Visit (HOSPITAL_COMMUNITY): Payer: Medicare Other | Attending: Internal Medicine

## 2020-09-24 ENCOUNTER — Other Ambulatory Visit: Payer: Self-pay

## 2020-09-24 DIAGNOSIS — R0602 Shortness of breath: Secondary | ICD-10-CM | POA: Insufficient documentation

## 2020-09-24 DIAGNOSIS — R9431 Abnormal electrocardiogram [ECG] [EKG]: Secondary | ICD-10-CM | POA: Insufficient documentation

## 2020-09-24 LAB — ECHOCARDIOGRAM COMPLETE
Area-P 1/2: 2.76 cm2
S' Lateral: 4.2 cm

## 2020-09-24 MED ORDER — PERFLUTREN LIPID MICROSPHERE
1.0000 mL | INTRAVENOUS | Status: AC | PRN
  Administered 2020-09-24: 3 mL via INTRAVENOUS

## 2020-09-26 ENCOUNTER — Other Ambulatory Visit: Payer: Self-pay

## 2020-09-26 ENCOUNTER — Ambulatory Visit (INDEPENDENT_AMBULATORY_CARE_PROVIDER_SITE_OTHER): Payer: Medicare Other | Admitting: Physician Assistant

## 2020-09-26 ENCOUNTER — Encounter (INDEPENDENT_AMBULATORY_CARE_PROVIDER_SITE_OTHER): Payer: Self-pay | Admitting: Physician Assistant

## 2020-09-26 VITALS — BP 103/68 | HR 77 | Temp 98.1°F | Ht 71.0 in | Wt >= 6400 oz

## 2020-09-26 DIAGNOSIS — M722 Plantar fascial fibromatosis: Secondary | ICD-10-CM

## 2020-09-26 DIAGNOSIS — E559 Vitamin D deficiency, unspecified: Secondary | ICD-10-CM

## 2020-09-26 DIAGNOSIS — G4733 Obstructive sleep apnea (adult) (pediatric): Secondary | ICD-10-CM

## 2020-09-26 DIAGNOSIS — Z6841 Body Mass Index (BMI) 40.0 and over, adult: Secondary | ICD-10-CM

## 2020-09-26 MED ORDER — VITAMIN D (ERGOCALCIFEROL) 1.25 MG (50000 UNIT) PO CAPS: 50000.0000 [IU] | ORAL_CAPSULE | ORAL | 0 refills | Status: DC

## 2020-09-27 ENCOUNTER — Emergency Department (HOSPITAL_COMMUNITY)
Admission: EM | Admit: 2020-09-27 | Discharge: 2020-09-28 | Disposition: A | Discharge status: Home / Self Care | Payer: Medicare Other | Attending: Emergency Medicine | Admitting: Emergency Medicine

## 2020-09-27 ENCOUNTER — Encounter (HOSPITAL_COMMUNITY): Payer: Self-pay | Admitting: Emergency Medicine

## 2020-09-27 ENCOUNTER — Emergency Department (HOSPITAL_COMMUNITY): Payer: Medicare Other

## 2020-09-27 ENCOUNTER — Other Ambulatory Visit: Payer: Self-pay

## 2020-09-27 DIAGNOSIS — M25571 Pain in right ankle and joints of right foot: Secondary | ICD-10-CM | POA: Insufficient documentation

## 2020-09-27 DIAGNOSIS — J45909 Unspecified asthma, uncomplicated: Secondary | ICD-10-CM | POA: Insufficient documentation

## 2020-09-27 DIAGNOSIS — M79671 Pain in right foot: Secondary | ICD-10-CM

## 2020-09-27 NOTE — ED Triage Notes (Signed)
Patient here from home reporting right ankle and foot pain that started 1 hour ago. Denies injury.

## 2020-09-28 MED ORDER — ACETAMINOPHEN 500 MG PO TABS
1000.0000 mg | ORAL_TABLET | Freq: Once | ORAL | Status: AC
  Administered 2020-09-28: 1000 mg via ORAL
  Filled 2020-09-28: qty 2

## 2020-09-28 NOTE — ED Provider Notes (Signed)
[facility]-EMERGENCY DEPT Provider Note  CSN: [id number] Arrival date & time: 09/27/20 2240  Chief Complaint(s) Foot Pain and Ankle Pain  HPI Robert Stokes is a 30 y.o. male    Foot Pain This is a recurrent problem. Episode onset: several months. Episode frequency: intermittent. Pertinent negatives include no chest pain, no abdominal pain and no headaches. Exacerbated by: walking. Nothing relieves the symptoms. Treatments tried: Aleve. The treatment provided mild relief.  Ankle Pain  Past Medical History Past Medical History:  Diagnosis Date   ADD (attention deficit disorder)    ADHD    Asthma    Back pain    Joint pain    Obesity    Sleep apnea    SOB (shortness of breath)    Swelling of lower extremity    Patient Active Problem List   Diagnosis Date Noted   Other fatigue 08/27/2020   Hyperglycemia 08/27/2020   SOB (shortness of breath) on exertion 08/27/2020   Vitamin D deficiency 08/27/2020   Moderate left ankle sprain 01/13/2019   Home Medication(s) Prior to Admission medications   Medication Sig Start Date End Date Taking? Authorizing Provider  meloxicam (MOBIC) 15 MG tablet Take 1 tablet (15 mg total) by mouth daily. Take 1 daily with food. 09/01/20   Arthor CaptainHarris, Abigail, PA-C  Vitamin D, Ergocalciferol, (DRISDOL) 1.25 MG (50000 UNIT) CAPS capsule Take 1 capsule (50,000 Units total) by mouth every 7 (seven) days. 09/26/20   Alois ClicheAguilar, Tracey, PA-C                                                                                                                                    Past Surgical History Past Surgical History:  Procedure Laterality Date   ADENOIDECTOMY     TONSILLECTOMY     Family History Family History  Problem Relation Age of Onset   Diabetes Mother    Hypertension Mother    Heart failure Mother    Cancer Mother    Depression Mother    Anxiety disorder Mother    Liver cancer Mother    Sleep apnea Mother    Alcoholism Mother     Drug abuse Mother    Cancer Father    Depression Father    Anxiety disorder Father    Sleep apnea Father    Alcoholism Father    Drug abuse Father     Social History Social History   Tobacco Use   Smoking status: Never   Smokeless tobacco: Never  Vaping Use   Vaping Use: Never used  Substance Use Topics   Alcohol use: Yes    Alcohol/week: 2.0 standard drinks    Types: 2 Cans of beer per week    Comment: A MONTH   Drug use: No   Allergies Patient has no known allergies.  Review of Systems Review of Systems  Cardiovascular:  Negative for chest pain.  Gastrointestinal:  Negative  for abdominal pain.  Neurological:  Negative for headaches.  All other systems are reviewed and are negative for acute change except as noted in the HPI  Physical Exam Vital Signs  I have reviewed the triage vital signs BP (!) 155/84 (BP Location: Right Wrist)   Pulse 82   Temp 98.4 F (36.9 C) (Oral)   Resp 20   Ht 5\' 11"  (1.803 m)   Wt (!) 181.4 kg   SpO2 97%   BMI 55.79 kg/m   Physical Exam Vitals reviewed.  Constitutional:      General: He is not in acute distress.    Appearance: He is well-developed. He is morbidly obese. He is not diaphoretic.  HENT:     Head: Normocephalic and atraumatic.     Right Ear: External ear normal.     Left Ear: External ear normal.     Nose: Nose normal.     Mouth/Throat:     Mouth: Mucous membranes are moist.  Eyes:     General: No scleral icterus.    Conjunctiva/sclera: Conjunctivae normal.  Neck:     Trachea: Phonation normal.  Cardiovascular:     Rate and Rhythm: Normal rate and regular rhythm.  Pulmonary:     Effort: Pulmonary effort is normal. No respiratory distress.     Breath sounds: No stridor.  Abdominal:     General: There is no distension.  Musculoskeletal:        General: Normal range of motion.     Cervical back: Normal range of motion.     Right foot: Normal range of motion. Tenderness and bony tenderness present. No  deformity.     Left foot: Normal range of motion. No deformity.       Feet:  Feet:     Right foot:     Skin integrity: No erythema or warmth.     Left foot:     Skin integrity: No erythema or warmth.  Neurological:     Mental Status: He is alert and oriented to person, place, and time.  Psychiatric:        Behavior: Behavior normal.    ED Results and Treatments Labs (all labs ordered are listed, but only abnormal results are displayed) Labs Reviewed - No data to display                                                                                                                       EKG  EKG Interpretation  Date/Time:    Ventricular Rate:    PR Interval:    QRS Duration:   QT Interval:    QTC Calculation:   R Axis:     Text Interpretation:          Radiology DG Foot Complete Right  Result Date: 09/27/2020 CLINICAL DATA:  Pain. Right lateral ankle and right midfoot. Since stepped down onto step today and felt pop of right ankle. EXAM: RIGHT FOOT COMPLETE - 3+ VIEW COMPARISON:  None.  FINDINGS: No evidence of fracture or dislocation. No cortical erosion or destruction. No evidence of severe arthropathy. No aggressive appearing focal bone abnormality. Mild subcutaneus soft tissue edema along the dorsal midfoot. IMPRESSION: No acute displaced fracture or dislocation of the right foot. Given history, if concern for right ankle fracture, please obtain dedicated right ankle radiographs. Electronically Signed   By: Tish Frederickson M.D.   On: 09/27/2020 23:20    Pertinent labs & imaging results that were available during my care of the patient were reviewed by me and considered in my medical decision making (see chart for details).  Medications Ordered in ED Medications  acetaminophen (TYLENOL) tablet 1,000 mg (has no administration in time range)                                                                                                                                     Procedures Procedures  (including critical care time)  Medical Decision Making / ED Course I have reviewed the nursing notes for this encounter and the patient's prior records (if available in EHR or on provided paperwork).   ELIH MOONEY was evaluated in Emergency Department on 09/28/2020 for the symptoms described in the history of present illness. He was evaluated in the context of the global COVID-19 pandemic, which necessitated consideration that the patient might be at risk for infection with the SARS-CoV-2 virus that causes COVID-19. Institutional protocols and algorithms that pertain to the evaluation of patients at risk for COVID-19 are in a state of rapid change based on information released by regulatory bodies including the CDC and federal and state organizations. These policies and algorithms were followed during the patient's care in the ED.  Mid foot pain. Recurring. Exacerbated while walking up stairs.  No fall or trauma. Plain film negative for fracture Possible ligamentous injury or cuboid syndrome given the location of pain and recurrence. Supportive management recommended.        Final Clinical Impression(s) / ED Diagnoses Final diagnoses:  Right foot pain   The patient appears reasonably screened and/or stabilized for discharge and I doubt any other medical condition or other [facility] requiring further screening, evaluation, or treatment in the ED at this time prior to discharge. Safe for discharge with strict return precautions.  Disposition: Discharge  Condition: Good  I have discussed the results, Dx and Tx plan with the patient/family who expressed understanding and agree(s) with the plan. Discharge instructions discussed at length. The patient/family was given strict return precautions who verbalized understanding of the instructions. No further questions at time of discharge.    ED Discharge Orders     None        Follow Up: [facility] Address: [address] [place], [place], [place] [address] Phone: [phone] Appointments: [email or web address] Call  to schedule an appointment for close follow up regarding possible Cuboid Syndrome      This  chart was dictated using voice recognition software.  Despite best efforts to proofread,  errors can occur which can change the documentation meaning.    Nira Conn, MD 09/28/20 [phone]

## 2020-10-02 ENCOUNTER — Other Ambulatory Visit: Payer: Self-pay

## 2020-10-02 ENCOUNTER — Ambulatory Visit (INDEPENDENT_AMBULATORY_CARE_PROVIDER_SITE_OTHER): Payer: Medicare Other

## 2020-10-02 ENCOUNTER — Encounter: Payer: Self-pay | Admitting: Podiatrist

## 2020-10-02 ENCOUNTER — Ambulatory Visit (INDEPENDENT_AMBULATORY_CARE_PROVIDER_SITE_OTHER): Payer: Medicare Other | Admitting: Podiatrist

## 2020-10-02 DIAGNOSIS — M25579 Pain in unspecified ankle and joints of unspecified foot: Secondary | ICD-10-CM | POA: Diagnosis not present

## 2020-10-02 DIAGNOSIS — S99921A Unspecified injury of right foot, initial encounter: Secondary | ICD-10-CM

## 2020-10-02 NOTE — Patient Instructions (Addendum)
Use your crutches to keep your foot off the ground while in the cast-    We will check you back in 2 weeks to see how the foot is doing.     A chiropractor I know of is Dr Tonye Becket at [facility] in [place]

## 2020-10-02 NOTE — Progress Notes (Signed)
Chief Complaint  Patient presents with   Foot Pain    Right foot pain x 6+ months. Pain associated with edema. Aggravated by walking and weightbearing.       HPI: Patient is 30 y.o. male who presents today for follow up of right foot pain.  He relates he has had pain for 6 or more months and has tried many treatments including injection therapy, boot therapy, orthotics, ice and stretching.  Relates walking and putting weight on his right foot is painful along the lateral side.  Relates pain is the worst in the morning.     No Known Allergies  Review of systems is reviewed and negative.   Physical Exam  Patient is awake, alert, and oriented x 3.  In no acute distress.    Vascular status is intact with palpable pedal pulses DP and PT bilateral and capillary refill time less than 3 seconds bilateral.  No edema or erythema noted.   Neurological exam reveals epicritic and protective sensation grossly intact bilateral.   Dermatological exam reveals skin is supple and dry to bilateral feet.  No open lesions present.    Musculoskeletal exam: Musculature intact with dorsiflexion, plantarflexion, inversion, eversion. Pain along the calcaneal cuboid joint is noted with pressure.  Some tenderness along the peroneal tendon is present at its insertion along the fifth metatarsal base.    Xrays negative for acute fracture.  Cuboid appears normal and the cc joint also appears to be in proper alignment and position  Assessment:  1. Foot injury, right, initial encounter   2. Joint pain of ankle and foot, unspecified laterality       Plan: Discussed treatment options.  The patient feels he has exhausted all the conservative therapies including boot immobilization, injections, and orthotics.  I offered him a cast as an option and told him he would need crutches to stay immobilized off this foot and he would have to keep the cast dry. The patient would like to try a cast to see if this would help with  the pain.  A below knee cast was applied and he will be back in 2 weeks for recheck and likely reapplication of a cast if it is helping.  I wrote a prescription for him to obtain crutches at [facility] supply.  If he has any problems in the interim he will call.

## 2020-10-03 NOTE — Progress Notes (Signed)
Chief Complaint:   OBESITY Nori is here to discuss his progress with his obesity treatment plan along with follow-up of his obesity related diagnoses. Kinser is on keeping a food journal and adhering to recommended goals of 1500-2000 calories and 100+ grams of protein daily and states he is following his eating plan approximately 100% of the time. Dasean states he is at the gym for 1-2 hours 1 time per week, and walking for 2 hours 7 times per week.   Today's visit was #: 3 Starting weight: 412 lbs Starting date: 08/21/2020 Today's weight: 405 lbs Today's date: 09/26/2020 Total lbs lost to date: 7 Total lbs lost since last in-office visit: 3  Interim History: Jamee did well with weight loss. He is not hungry until 8 pm. He tried journaling but My[employer]itness Pal was not compatible with his phone. He does not like breakfast food.  Subjective:   1. Vitamin D deficiency Payton is on Vit D, and he denies nausea, vomiting, or muscle weakness.  2. Plantar fasciitis of right foot Luismario has right foot plantar fasciitis, and he has orthotics.   3. OSA (obstructive sleep apnea) Narada's obstructive sleep apnea is untreated. He is not on CPAP, and was diagnosed 12-13 years ago. He is waking up with chocking sensation. He notes morning headaches and excessive fatigue. His repeat echocardiogram shows slight left ventricle hypertrophy.  Assessment/Plan:   1. Vitamin D deficiency Low Vitamin D level contributes to fatigue and are associated with obesity, breast, and colon cancer. We will refill prescription Vitamin D for 1 month. Haygen will follow-up for routine testing of Vitamin D, at least 2-3 times per year to avoid over-replacement.  - Vitamin D, Ergocalciferol, (DRISDOL) 1.25 MG (50000 UNIT) CAPS capsule; Take 1 capsule (50,000 Units total) by mouth every 7 (seven) days.  Dispense: 4 capsule; Refill: 0  2. Plantar fasciitis of right foot Ariq is to continue stretching, icing, NSAIDS,  and wearing orthotics.  3. OSA (obstructive sleep apnea) Intensive lifestyle modifications are the first line treatment for this issue. We discussed several lifestyle modifications today. We will refer Wetzel to Neurology for a sleep study. He will continue to work on diet, exercise and weight loss efforts. We will continue to monitor. Orders and follow up as documented in patient record.   - Ambulatory referral to Neurology  4. Morbid obesity (HCC) Hassani is currently in the action stage of change. As such, his goal is to continue with weight loss efforts. He has agreed to keeping a food journal and adhering to recommended goals of 1500-1800 calories and 100 grams of protein daily.   Nile will try the Lose It app or My Plate app for journaling.  Exercise goals: As is.  Behavioral modification strategies: no skipping meals and meal planning and cooking strategies.  Callaway has agreed to follow-up with our clinic in 4 to 5 weeks. He was informed of the importance of frequent follow-up visits to maximize his success with intensive lifestyle modifications for his multiple health conditions.   Objective:   Blood pressure 103/68, pulse 77, temperature 98.1 F (36.7 C), height 5\' 11"  (1.803 m), weight (!) 405 lb (183.7 kg), SpO2 96 %. Body mass index is 56.49 kg/m.  General: Cooperative, alert, well developed, in no acute distress. HEENT: Conjunctivae and lids unremarkable. Cardiovascular: Regular rhythm.  Lungs: Normal work of breathing. Neurologic: No focal deficits.   Lab Results  Component Value Date   CREATININE 0.76 09/01/2020   BUN 15  09/01/2020   NA 139 09/01/2020   K 3.8 09/01/2020   CL 102 09/01/2020   CO2 27 09/01/2020   Lab Results  Component Value Date   ALT 29 08/21/2020   AST 18 08/21/2020   ALKPHOS 109 08/21/2020   BILITOT 0.4 08/21/2020   Lab Results  Component Value Date   HGBA1C 5.0 08/21/2020   Lab Results  Component Value Date   INSULIN 23.4  08/21/2020   Lab Results  Component Value Date   TSH 2.870 08/21/2020   Lab Results  Component Value Date   CHOL 184 08/21/2020   HDL 39 (L) 08/21/2020   LDLCALC 126 (H) 08/21/2020   TRIG 104 08/21/2020   Lab Results  Component Value Date   VD25OH 12.2 (L) 08/21/2020   Lab Results  Component Value Date   WBC 9.2 09/01/2020   HGB 14.3 09/01/2020   HCT 45.7 09/01/2020   MCV 88.2 09/01/2020   PLT 246 09/01/2020   No results found for: IRON, TIBC, FERRITIN  Obesity Behavioral Intervention:   Approximately 15 minutes were spent on the discussion below.  ASK: We discussed the diagnosis of obesity with [name] today and Hilary agreed to give [name] permission to discuss obesity behavioral modification therapy today.  ASSESS: Siegfried has the diagnosis of obesity and his BMI today is 56.51. Andrea is in the action stage of change.   ADVISE: Husain was educated on the multiple health risks of obesity as well as the benefit of weight loss to improve his health. He was advised of the need for long term treatment and the importance of lifestyle modifications to improve his current health and to decrease his risk of future health problems.  AGREE: Multiple dietary modification options and treatment options were discussed and Hercules agreed to follow the recommendations documented in the above note.  ARRANGE: Doyel was educated on the importance of frequent visits to treat obesity as outlined per CMS and USPSTF guidelines and agreed to schedule his next follow up appointment today.  Attestation Statements:   Reviewed by clinician on day of visit: allergies, medications, problem list, medical history, surgical history, family history, social history, and previous encounter notes.   Trude Mcburney, am acting as transcriptionist for [employer], PA-C.  I have reviewed the above documentation for accuracy and completeness, and I agree with the above. Alois Cliche, PA-C

## 2020-10-16 ENCOUNTER — Encounter (INDEPENDENT_AMBULATORY_CARE_PROVIDER_SITE_OTHER): Payer: Self-pay | Admitting: Physician Assistant

## 2020-10-16 ENCOUNTER — Other Ambulatory Visit: Payer: Self-pay

## 2020-10-16 ENCOUNTER — Ambulatory Visit (INDEPENDENT_AMBULATORY_CARE_PROVIDER_SITE_OTHER): Payer: Medicare Other | Admitting: Physician Assistant

## 2020-10-16 VITALS — BP 126/81 | HR 72 | Temp 98.2°F | Ht 71.0 in | Wt >= 6400 oz

## 2020-10-16 DIAGNOSIS — E559 Vitamin D deficiency, unspecified: Secondary | ICD-10-CM | POA: Diagnosis not present

## 2020-10-16 DIAGNOSIS — Z6841 Body Mass Index (BMI) 40.0 and over, adult: Secondary | ICD-10-CM

## 2020-10-16 DIAGNOSIS — E8881 Metabolic syndrome: Secondary | ICD-10-CM

## 2020-10-16 MED ORDER — VITAMIN D (ERGOCALCIFEROL) 1.25 MG (50000 UNIT) PO CAPS: 50000.0000 [IU] | ORAL_CAPSULE | ORAL | 0 refills | Status: DC

## 2020-10-18 ENCOUNTER — Other Ambulatory Visit: Payer: Self-pay

## 2020-10-18 ENCOUNTER — Encounter: Payer: Self-pay | Admitting: Podiatry

## 2020-10-18 ENCOUNTER — Ambulatory Visit (INDEPENDENT_AMBULATORY_CARE_PROVIDER_SITE_OTHER): Payer: Medicare Other | Admitting: Podiatry

## 2020-10-18 DIAGNOSIS — G8929 Other chronic pain: Secondary | ICD-10-CM | POA: Diagnosis not present

## 2020-10-18 DIAGNOSIS — M79671 Pain in right foot: Secondary | ICD-10-CM | POA: Diagnosis not present

## 2020-10-18 DIAGNOSIS — M84374A Stress fracture, right foot, initial encounter for fracture: Secondary | ICD-10-CM

## 2020-10-18 DIAGNOSIS — M25579 Pain in unspecified ankle and joints of unspecified foot: Secondary | ICD-10-CM | POA: Diagnosis not present

## 2020-10-23 NOTE — Progress Notes (Signed)
Chief Complaint:   OBESITY Robert Stokes is here to discuss his progress with his obesity treatment plan along with follow-up of his obesity related diagnoses. Robert Stokes is on keeping a food journal and adhering to recommended goals of 1500-1800 calories and 100 grams of protein daily and states he is following his eating plan approximately 50% of the time. Robert Stokes states he is walking for 30 minutes 7 times per week.  Today's visit was #: 4 Starting weight: 412 lbs Starting date: 08/21/2020 Today's weight: 408 lbs Today's date: 10/16/2020 Total lbs lost to date: 4 Total lbs lost since last in-office visit: 0  Interim History: Robert Stokes reports that he is not hungry and he is eating all of his food in one meal, which he does not like to do. He is meeting with a Neurologist in a few weeks for possible obstructive sleep apnea.  Subjective:   1. Vitamin D deficiency Robert Stokes is on Vit D weekly, and he is tolerating it well.  2. Insulin resistance Robert Stokes is not on medications, and he denies polyphagia.  Assessment/Plan:   1. Vitamin D deficiency Low Vitamin D level contributes to fatigue and are associated with obesity, breast, and colon cancer. We will refill prescription Vitamin D for 1 month. Robert Stokes will follow-up for routine testing of Vitamin D, at least 2-3 times per year to avoid over-replacement.  - Vitamin D, Ergocalciferol, (DRISDOL) 1.25 MG (50000 UNIT) CAPS capsule; Take 1 capsule (50,000 Units total) by mouth every 7 (seven) days.  Dispense: 4 capsule; Refill: 0  2. Insulin resistance Robert Stokes will continue his meal plan, and will continue to work on weight loss, exercise, and decreasing simple carbohydrates to help decrease the risk of diabetes. Robert Stokes agreed to follow-up with [name] as directed to closely monitor his progress.  3. Morbid obesity (HCC) Robert Stokes is currently in the action stage of change. As such, his goal is to continue with weight loss efforts. He has agreed to the  Category 4 Plan.   Exercise goals: As is.  Behavioral modification strategies: meal planning and cooking strategies.  Robert Stokes has agreed to follow-up with our clinic in 3 weeks. He was informed of the importance of frequent follow-up visits to maximize his success with intensive lifestyle modifications for his multiple health conditions.   Objective:   Blood pressure 126/81, pulse 72, temperature 98.2 F (36.8 C), height 5\' 11"  (1.803 m), weight (!) 408 lb (185.1 kg), SpO2 96 %. Body mass index is 56.9 kg/m.  General: Cooperative, alert, well developed, in no acute distress. HEENT: Conjunctivae and lids unremarkable. Cardiovascular: Regular rhythm.  Lungs: Normal work of breathing. Neurologic: No focal deficits.   Lab Results  Component Value Date   CREATININE 0.76 09/01/2020   BUN 15 09/01/2020   NA 139 09/01/2020   K 3.8 09/01/2020   CL 102 09/01/2020   CO2 27 09/01/2020   Lab Results  Component Value Date   ALT 29 08/21/2020   AST 18 08/21/2020   ALKPHOS 109 08/21/2020   BILITOT 0.4 08/21/2020   Lab Results  Component Value Date   HGBA1C 5.0 08/21/2020   Lab Results  Component Value Date   INSULIN 23.4 08/21/2020   Lab Results  Component Value Date   TSH 2.870 08/21/2020   Lab Results  Component Value Date   CHOL 184 08/21/2020   HDL 39 (L) 08/21/2020   LDLCALC 126 (H) 08/21/2020   TRIG 104 08/21/2020   Lab Results  Component Value Date  VD25OH 12.2 (L) 08/21/2020   Lab Results  Component Value Date   WBC 9.2 09/01/2020   HGB 14.3 09/01/2020   HCT 45.7 09/01/2020   MCV 88.2 09/01/2020   PLT 246 09/01/2020   No results found for: IRON, TIBC, FERRITIN  Obesity Behavioral Intervention:   Approximately 15 minutes were spent on the discussion below.  ASK: We discussed the diagnosis of obesity with Robert Stokes today and Robert Stokes agreed to give [name] permission to discuss obesity behavioral modification therapy today.  ASSESS: Robert Stokes has the diagnosis  of obesity and his BMI today is 56.93. Robert Stokes is in the action stage of change.   ADVISE: Robert Stokes was educated on the multiple health risks of obesity as well as the benefit of weight loss to improve his health. He was advised of the need for long term treatment and the importance of lifestyle modifications to improve his current health and to decrease his risk of future health problems.  AGREE: Multiple dietary modification options and treatment options were discussed and Robert Stokes agreed to follow the recommendations documented in the above note.  ARRANGE: Robert Stokes was educated on the importance of frequent visits to treat obesity as outlined per CMS and USPSTF guidelines and agreed to schedule his next follow up appointment today.  Attestation Statements:   Reviewed by clinician on day of visit: allergies, medications, problem list, medical history, surgical history, family history, social history, and previous encounter notes.   Trude Mcburney, am acting as transcriptionist for [employer], PA-C.  I have reviewed the above documentation for accuracy and completeness, and I agree with the above. Alois Cliche, PA-C

## 2020-10-24 ENCOUNTER — Encounter: Payer: Self-pay | Admitting: Neurology

## 2020-10-24 ENCOUNTER — Other Ambulatory Visit: Payer: Self-pay

## 2020-10-24 ENCOUNTER — Ambulatory Visit (INDEPENDENT_AMBULATORY_CARE_PROVIDER_SITE_OTHER): Payer: Medicare Other | Admitting: Neurology

## 2020-10-24 VITALS — BP 117/66 | HR 71 | Ht 71.0 in | Wt >= 6400 oz

## 2020-10-24 DIAGNOSIS — E669 Obesity, unspecified: Secondary | ICD-10-CM | POA: Insufficient documentation

## 2020-10-24 DIAGNOSIS — G44019 Episodic cluster headache, not intractable: Secondary | ICD-10-CM | POA: Diagnosis not present

## 2020-10-24 DIAGNOSIS — G4719 Other hypersomnia: Secondary | ICD-10-CM | POA: Insufficient documentation

## 2020-10-24 DIAGNOSIS — R519 Headache, unspecified: Secondary | ICD-10-CM | POA: Diagnosis not present

## 2020-10-24 DIAGNOSIS — R0601 Orthopnea: Secondary | ICD-10-CM | POA: Diagnosis not present

## 2020-10-24 DIAGNOSIS — G4701 Insomnia due to medical condition: Secondary | ICD-10-CM

## 2020-10-24 DIAGNOSIS — R0683 Snoring: Secondary | ICD-10-CM | POA: Insufficient documentation

## 2020-10-24 MED ORDER — TRIAMCINOLONE ACETONIDE 55 MCG/ACT NA AERO: 2.0000 | INHALATION_SPRAY | Freq: Every day | NASAL | 12 refills | Status: DC

## 2020-10-24 NOTE — Progress Notes (Addendum)
SLEEP MEDICINE CLINIC    Provider:  Melvyn Novas, MD  Primary Care Physician:  Pcp, No No address on file     Referring Provider: Alois Cliche, Pa-c [address] [place],  [place] [address]          Chief Complaint according to patient   Patient presents with:     New Patient (Initial Visit)           HISTORY OF PRESENT ILLNESS:  Robert Stokes is a 30 y.o. male patient and seen here as a referral on 10/24/2020 from [facility]. He is here for sleep consultation.   Chief concern according to patient : At the pleasure of meeting with Robert Stokes today, he was diagnosed with sleep apnea about 12 years ago maybe even longer but he has not been given CPAP.  He had no follow up- this was in [place]. He often goes to bed very late between 3 and 5 AM but then wakes around 6 AM and feels fully charged and energized.  He feels groggy later in the day.  He has been told by roommates that he snores and he may suffer from obesity hypoventilation given his BMI is 56.  He does have a recent acquired hairline fracture of the cuboid bone of the right foot which is painful.  This does not affect his sleep however.   He  has a past medical history of ADD (attention deficit disorder), ADHD, Asthma, Back pain, Joint pain, Super-Obesity, Sleep apnea dx but no treatment, SOB (shortness of breath), and Swelling of lower extremity.    Sleep relevant medical history: Nocturia rarely- snoring, yes, dry mouth all day. Headaches in AM, , Cluster headache- stabbing  feeling  headaches waking him at night.   Had adenoid and Tonsillectomy at age 30-12, was already snoring.     Family medical /sleep history: no  other family member was diagnosed  with OSA, but insomnia,   He states that his mother and several uncles etc. have all had trouble with insomnia but is getting restful sleep there has been a high prevalence of diabetes. Mother died of cancer at age 32-  chemotherapy- Liver metastasis, DM, CAD, HTN.   Social history:  Patient is working as Therapist, sports. Finished 6-12 grade in [place].   He lives in a household with  2 room mates. Family status is single . The patient currently is out on medical leave- works/ used to work in shifts( night/ rotating,) Tobacco use: none.  ETOH use 2 beers every 6 month ,  Caffeine ; none Regular exercise- none .   Hobbies :video games , and cooking.       Sleep habits are as follows: The patient's dinner time is between 5.30-7 PM. He skips meals.  The patient goes to bed at  2.30 AM and continues to sleep for only 1-2  hours, wakes form anxiety, palpitations, tremor, and tension. GERD. He insisted that he sleeps only a few hours a day- He never feels really refreshed and restored in the morning, if he can sleep longer it is and intervals of sometimes only 30 minutes interrupted by periods of wakefulness. He prefers to sleep on his side on 3 pillows for support. Dreams are reportedly rare. 5  AM was the usual rise time during his work time.  He would come home by around 330 and will nap for another 2 or 3 hours.  So this would give him about 6 hours of sleep total.. The patient wakes up spontaneously H reports not feeling refreshed or restored in AM, with symptoms such as dry mouth, sleep cluster and morning headaches, and residual fatigue.  He describes a cluster headache with a sharp stabbing sensation behind behind the right eye.  Penetrating from the temple. Naps are taken infrequently, lasting from 30 minutes to 120 minutes. He has not avoided naps when working, now he does. .    Review of Systems: Out of a complete 14 system review, the patient complains of only the following symptoms, and all other reviewed systems are negative.:  Fatigue, sleepiness , snoring, fragmented sleep, Insomnia.  Short sleeper, orthopnea.   He is currently enrolled in a healthy weight and wellness study Ms. Vernona Rieger  hoping to lose enough weight to be safe for a bariatric surgery.     How likely are you to doze in the following situations: 0 = not likely, 1 = slight chance, 2 = moderate chance, 3 = high chance   Sitting and Reading? Watching Television? Sitting inactive in a public place (theater or meeting)? As a passenger in a car for an hour without a break? Lying down in the afternoon when circumstances permit? Sitting and talking to someone? Sitting quietly after lunch without alcohol? In a car, while stopped for a few minutes in traffic?   Total = 14-16/ 24 points   FSS endorsed at 55/ 63 points.   Social History   Socioeconomic History   Marital status: Single    Spouse name: Not on file   Number of children: Not on file   Years of education: Not on file   Highest education level: 12th grade  Occupational History   Occupation: DISH WASHER/ STUDENT  Tobacco Use   Smoking status: Never   Smokeless tobacco: Never  Vaping Use   Vaping Use: Never used  Substance and Sexual Activity   Alcohol use: Never   Drug use: No   Sexual activity: Not Currently  Other Topics Concern   Not on file  Social History Narrative   Live with 2 roommates   Right handed   No caffeine    Social Determinants of Health   Financial Resource Strain: Not on file  Food Insecurity: Not on file  Transportation Needs: Not on file  Physical Activity: Not on file  Stress: Not on file  Social Connections: Not on file    Family History  Problem Relation Age of Onset   Diabetes Mother    Hypertension Mother    Heart failure Mother    Cancer Mother    Depression Mother    Anxiety disorder Mother    Liver cancer Mother    Sleep apnea Mother    Alcoholism Mother    Drug abuse Mother    Cancer Father    Depression Father    Anxiety disorder Father    Sleep apnea Father    Alcoholism Father    Drug abuse Father     Past Medical History:  Diagnosis Date   ADD (attention deficit disorder)     ADHD    Asthma    Back pain    Joint pain    Obesity    Sleep apnea    SOB (shortness of breath)    Swelling of lower extremity     Past Surgical History:  Procedure Laterality Date   ADENOIDECTOMY     TONSILLECTOMY  Current Outpatient Medications on File Prior to Visit  Medication Sig Dispense Refill   diclofenac (VOLTAREN) 50 MG EC tablet Take 50 mg by mouth daily.     meloxicam (MOBIC) 15 MG tablet Take 1 tablet (15 mg total) by mouth daily. Take 1 daily with food. 10 tablet 0   methylPREDNISolone (MEDROL) 4 MG tablet Take 4 mg by mouth daily.     No current facility-administered medications on file prior to visit.    No Known Allergies  Physical exam:  Today's Vitals   10/24/20 1036  BP: 117/66  Pulse: 71  Weight: (!) 408 lb (185.1 kg)  Height: 5\' 11"  (1.803 m)   Body mass index is 56.9 kg/m.   Wt Readings from Last 3 Encounters:  10/24/20 (!) 408 lb (185.1 kg)  10/16/20 (!) 408 lb (185.1 kg)  09/27/20 (!) 400 lb (181.4 kg)     Ht Readings from Last 3 Encounters:  10/24/20 5\' 11"  (1.803 m)  10/16/20 5\' 11"  (1.803 m)  09/27/20 5\' 11"  (1.803 m)      General: The patient is awake, alert and appears not in acute distress. The patient is cooperative.  Head: Normocephalic, atraumatic. Neck is supple. Mallampati 3, small oral opening, irregular dentition- crowded. ,  neck circumference:22 inches . Nasal airflow not patent.  Retrognathia is not seen.  Dental status: see above.  Cardiovascular:  Regular rate and cardiac rhythm by pulse,  without distended neck veins. Respiratory: Lungs are clear to auscultation.  Skin:  Without evidence of ankle edema, or rash. Trunk: The patient's posture is erect.   Neurologic exam : The patient is awake and alert, oriented to place and time.   Memory subjective described as intact.  Attention span & concentration ability appears normal.  Speech is fluent,  without  dysarthria, nasal speech / dysphonia or aphasia.   Mood and affect are appropriate.   Cranial nerves: no loss of smell or taste reported  Pupils are equal and briskly reactive to light. Funduscopic exam deferred. .  Extraocular movements in vertical and horizontal planes were intact and without nystagmus. No Diplopia. Visual fields by finger perimetry are intact. Hearing was intact to soft voice and finger rubbing.   Facial sensation intact to fine touch.  Facial motor strength is symmetric and tongue and uvula move midline.  Neck ROM : rotation, tilt and flexion extension were normal for age and shoulder shrug was symmetrical.    Motor exam:  Symmetric bulk, tone and ROM.   Normal tone without cog wheeling, symmetric grip strength .   Sensory:  Fine touch and vibration were  normal.  Proprioception tested in the upper extremities was normal.   Coordination: Rapid alternating movements in the fingers/hands were of normal speed.  The Finger-to-nose maneuver was intact without evidence of ataxia, dysmetria or tremor.   Gait and station: Patient could rise unassisted from a seated position, walked without assistive device.  Stance is of  wider base and the patient turned with 4 steps. He wears a boot on his right foot-  Toe and heel walk were deferred.  Deep tendon reflexes: in the  upper and lower extremities are  trace only.       After spending a total time of  40  minutes face to face and additional time for physical and neurologic examination, review of laboratory studies,  personal review of imaging studies, reports and results of other testing and review of referral information / records as far as provided  in visit, I have established the following assessments:  1) excessive daytime sleepiness reflected in her Epworth score between 14 and 16. 2) witnessed snoring and apnea even in childhood leading to a tonsil and adenoidectomy which did not cure him. 3) excessive daytime fatigue and reportedly very restricted sleep hours highly  fragmented sleep, orthopnea. 4) BMI of 57 kg per metered square predisposing this patient is additionally upper airway anatomy and large neck size to obstructive sleep apnea and obesity related hypoventilation.  Hypoventilation and hypoxemia may be the cause for some of his arousals as well and are strongly correlated with cluster headaches which she also describes.  These headaches are stabbing sensation arising from the right temple to behind the right eye.  He also frequently wakes up with morning headaches a sign of CO2 retention.   My Plan is to proceed with:  1)SPLIT NIGHT in BED 2- my first choice would be to have Robert Stokes come to the sleep lab and undergo a split-night polysomnography which includes sleep for up to 2 hours without any treatment to establish the diagnosis and if a severe apnea or severe hypoxia is present starting titration the same night on a positive airway pressure device.  Given his BMI and his history of cluster headaches etc. he may actually require BiPAP and some patients will require additional oxygen to be bled into the machine. 2) if [place] healthcare will not allow him to undergo inpatient sleep study we will have to make do with a home sleep test but given his body mass index the data will be much less exacting especially those of hypoxemia.  3) I would like to add some sleep hygiene rules setting a rise time in the morning he already is trying to avoid daytime naps and we certainly would want him to restrict his sleepy time into the night.  Advancing the rise time may automatically adjust his bed time as well. 4) no dreams. Likely no REM sleep.   5) He has asthma, GERD and has a totally obstructed nose- habitual mouth breather. I wrote for nasocort generic form.   I would like to thank Alois Cliche, Pa-c [address] [place],  [place] [address] for allowing me to meet with and to take care of this pleasant patient.   In short, Robert Stokes is  presenting with symptoms that can be attributed to OBESITY HYPOVENTILATION< HYPOXIA AND HYPERCAPNIA, and manifesting as cluster headaches, morning headaches and apnea .   I plan to follow up either personally or through our NP within 2-4 month.     Electronically signed by: Melvyn Novas, MD 10/24/2020 11:03 AM  [facility] Board certified by The [employer] of Sleep Medicine and Diplomate of the [employer] of Sleep Medicine. Board certified In Neurology through the ABPN, Fellow of the [employer] of Neurology. Medical Director of [facility].

## 2020-10-24 NOTE — Patient Instructions (Signed)
[email or web address]">  Obesity-Hypoventilation Syndrome Obesity-hypoventilation syndrome (OHS) is a condition in which a person cannot efficiently move air in and out of the lungs (ventilate). This condition causes hypoventilation, which means the blood will have a buildup of carbon dioxideand a drop in oxygen levels. Key factors for OHS include having too much body fat, or obesity, and having high levels of awake daytime carbon dioxide (hypercapnia). OHS can increase the risk for: Cor pulmonale, or right-sided heart failure. Congestive heart failure. Pulmonary hypertension, or high blood pressure in the arteries in the lungs. Too many red blood cells in the body. Disability or death. What are the causes? This condition may be caused by: Being obese with a BMI (body mass index) greater than or equal to 30 kg/m2. Having too much fat around the abdomen, chest, and neck. The brain being unable to properly manage the high carbon dioxide and low oxygen levels. Hormones made by fat cells. These hormones may interfere with breathing. Sleep apnea. This is when breathing stops, pauses, or is shallow during sleep. What are the signs or symptoms? Symptoms of this condition include: Feeling sleepy during the day. Headaches. These may be worse in the morning. Shortness of breath. Snoring, choking, gasping, or trouble breathing during sleep. Poor concentration or poor memory. Mood changes or feeling irritable. Depression. How is this diagnosed? This condition may be diagnosed by: BMI measurement. Blood tests to measure blood levels of serum bicarbonate, carbon dioxide, and oxygen. Pulse oximetry to measure the amount of oxygen in your blood. This uses a small device that is placed on your finger, earlobe, or toe. Polysomnogram, or sleep study, to check your breathing patterns and levels of oxygen and carbon dioxide while  you sleep. You may also have other tests, including: A chest X-ray to rule out other breathing problems. Lung tests, or pulmonary function tests, to rule out other breathing problems. ECG (electrocardiogram) or echocardiogram to check for signs of heart failure. How is this treated? This condition may be treated with: A device such as a continuous positive airway pressure (CPAP) machine or a bi-level positive airway pressure (BPAP) machine. These devices deliver pressure and sometimes oxygen to make breathing easier. A mask may be placed over your nose or mouth. Oxygen if your blood oxygen levels are low. A weight-loss program. Bariatric, or weight-loss, surgery. Tracheostomy. A tube is placed in the windpipe through the neck to help with breathing. Follow these instructions at home:  Medicines Take over-the-counter and prescription medicines only as told by your health care provider. Ask your health care provider what medicines are safe for you. You may be told to avoid medicines such as sedatives and narcotics. These can affect breathing and make OHS worse. Sleeping habits If you are prescribed a CPAP or a BPAP machine, make sure you understand how to use it. Use your CPAP or BPAP machine only as told by your health care provider. Try to get at least 8 hours of sleep every night. Eating and drinking  Eat foods that are high in fiber, such as beans, whole grains, and fresh fruits and vegetables. Limit foods that are high in fat and processed sugars, such as fried or sweet foods. Drink enough fluid to keep your urine pale yellow. Do not drink alcohol if: Your health care provider tells you not to drink. You are pregnant, may be pregnant, or are planning to become pregnant.  General instructions Follow a diet and exercise plan that helps you reach and keep a healthy weight   as told by your health care provider. Exercise regularly as told by your health care provider. Do not use any  products that contain nicotine or tobacco, such as cigarettes, e-cigarettes, and chewing tobacco. If you need help quitting, ask your health care provider. Keep all follow-up visits as told by your health care provider. This is important. Contact a health care provider if: You develop new or worsening shortness of breath. You are having trouble waking up or staying awake. You are confused. You have chest pain. You have fast or irregular heartbeats. You are dizzy or you faint. You develop a cough. You have a fever. Get help right away if: You have any symptoms of a stroke. "BE FAST" is an easy way to remember the main warning signs of a stroke: B - Balance. Signs are dizziness, sudden trouble walking, or loss of balance. E - Eyes. Signs are trouble seeing or a sudden change in vision. F - Face. Signs are sudden weakness or numbness of the face, or the face or eyelid drooping on one side. A - Arms. Signs are weakness or numbness in an arm. This happens suddenly and usually on one side of the body. S - Speech. Signs are sudden trouble speaking, slurred speech, or trouble understanding what people say. T - Time. Time to call emergency services. Write down what time symptoms started. You have other signs of a stroke, such as: A sudden, severe headache with no known cause. Nausea or vomiting. Seizure. These symptoms may represent a serious problem that is an emergency. Do not wait to see if the symptoms will go away. Get medical help right away. Call your local emergency services (911 in the [place] Do not drive yourself to the hospital. If you ever feel like you may hurt yourself or others, or have thoughts about taking your own life, get help right away. Go to your nearest emergency department or: Call your local emergency services (911 in the [place] Call a suicide crisis helpline, such as the National Suicide Prevention Lifeline at 1-800-273-8255. This is open 24 hours a day in the [place] Text  the Crisis Text Line at 741741 (in the [place] Summary Obesity-hypoventilation syndrome (OHS) causes hypoventilation, which means the blood will have a buildup of carbon dioxideand a drop in oxygen levels. Key factors for OHS include having too much body fat, or obesity, and having high levels of awake daytime carbon dioxide (hypercapnia). OHS can increase the risk for heart failure, pulmonary hypertension, disability, and death. Follow your diet and exercise plan as told by your health care provider. This information is not intended to replace advice given to you by your health care provider. Make sure you discuss any questions you have with your healthcare provider. Document Revised: 03/10/2019 Document Reviewed: 03/10/2019 Elsevier Patient Education  2022 [employer]vier Inc.  

## 2020-10-24 NOTE — Addendum Note (Signed)
Addended by: Melvyn Novas on: 10/24/2020 11:46 AM   Modules accepted: Orders

## 2020-10-25 NOTE — Progress Notes (Signed)
  Subjective:  Patient ID: Robert Stokes, male    DOB: Aug 16, 1990,  MRN: [id number]  Chief Complaint  Patient presents with   Foot Problem    check foot after cast    30 y.o. male presents with the above complaint.  Still having significant pain to the right foot.  States the cast is too much for this.  Cast appears worn and he has walked on it.  Has failed boot immobilization injections. Objective:  Physical Exam: warm, good capillary refill, no trophic changes or ulcerative lesions, normal DP and PT pulses and normal sensory exam. Right Foot: POP  4th/5th metatarsal areas  No images are attached to the encounter.  Radiographs: X-ray of the left foot: Transverse irregularity concerning for stress fracture fifth metatarsal Assessment:   1. Stress fracture of metatarsal bone, right, initial encounter   2. Joint pain of ankle and foot, unspecified laterality   3. Chronic foot pain, right    Plan:  Patient was evaluated and treated and all questions answered.  ?Stress fracture right foot -Cast removed today skin intact.  At this point as he has failed all other conservative therapy we will proceed with MRI to evaluate.  I encouraged him to stay off it is much as possible.  Follow-up after MRI for further evaluation   No follow-ups on file.

## 2020-11-01 ENCOUNTER — Ambulatory Visit
Admission: RE | Admit: 2020-11-01 | Discharge: 2020-11-01 | Disposition: A | Discharge status: Home / Self Care | Payer: Medicare Other | Source: Ambulatory Visit | Attending: Podiatry | Admitting: Podiatry

## 2020-11-01 ENCOUNTER — Other Ambulatory Visit: Payer: Self-pay

## 2020-11-01 DIAGNOSIS — M84374A Stress fracture, right foot, initial encounter for fracture: Secondary | ICD-10-CM

## 2020-11-08 ENCOUNTER — Other Ambulatory Visit: Payer: Self-pay

## 2020-11-08 ENCOUNTER — Ambulatory Visit (INDEPENDENT_AMBULATORY_CARE_PROVIDER_SITE_OTHER): Payer: Medicare Other | Admitting: Podiatry

## 2020-11-08 DIAGNOSIS — M8430XA Stress fracture, unspecified site, initial encounter for fracture: Secondary | ICD-10-CM | POA: Diagnosis not present

## 2020-11-08 DIAGNOSIS — M216X1 Other acquired deformities of right foot: Secondary | ICD-10-CM

## 2020-11-08 DIAGNOSIS — M79671 Pain in right foot: Secondary | ICD-10-CM

## 2020-11-08 DIAGNOSIS — M2141 Flat foot [pes planus] (acquired), right foot: Secondary | ICD-10-CM | POA: Diagnosis not present

## 2020-11-08 DIAGNOSIS — M2142 Flat foot [pes planus] (acquired), left foot: Secondary | ICD-10-CM

## 2020-11-08 DIAGNOSIS — M21861 Other specified acquired deformities of right lower leg: Secondary | ICD-10-CM

## 2020-11-08 DIAGNOSIS — G8929 Other chronic pain: Secondary | ICD-10-CM

## 2020-11-08 MED ORDER — METHYLPREDNISOLONE 4 MG PO TBPK: ORAL_TABLET | ORAL | 0 refills | Status: DC

## 2020-11-08 NOTE — Progress Notes (Signed)
  Subjective:  Patient ID: Robert Stokes, male    DOB: Feb 07, 1991,  MRN: [id number]  Chief Complaint  Patient presents with   Foot Pain    MRI Review. Pt states still having lots of pain and the boot is unhelpful.    30 y.o. male presents with the above complaint.  Still having significant pain to the right foot. The boot broke and he does not feel like it helps with the pain. Objective:  Physical Exam: warm, good capillary refill, no trophic changes or ulcerative lesions, normal DP and PT pulses and normal sensory exam. Right Foot: POP  4th/5th metatarsal areas    Study Result  Narrative & Impression  CLINICAL DATA:  Right foot pain. Concern for fifth metatarsal stress fracture   EXAM: MRI OF THE RIGHT FOREFOOT WITHOUT CONTRAST   TECHNIQUE: Multiplanar, multisequence MR imaging of the right forefoot was performed. No intravenous contrast was administered.   COMPARISON:  X-ray 09/27/2020, MRI 07/04/2020   FINDINGS: Bones/Joint/Cartilage   No acute fracture. No dislocation. No cortical thickening or periostitis. No bone marrow edema. Tiny marginal osteophytes along the lateral margin of the first MTP joint. No chondral defect. Remaining joint spaces of the forefoot appear within normal limits. No joint effusion or synovitis.   Ligaments   Intact Lisfranc ligament. Collateral ligaments of the forefoot are intact. No evidence of plantar plate disruption.   Muscles and Tendons   Intact flexor and extensor tendons without evidence of tendinosis, tear, or tenosynovitis. Normal muscle bulk and signal intensity without edema, atrophy, or fatty infiltration.   Soft tissues   Small volume fluid within the second and third intermetatarsal spaces. No intermetatarsal space mass. Mild subcutaneous edema over the dorsum of the forefoot. No fluid collection. No ulceration.   IMPRESSION: 1. No acute osseous abnormality of the right forefoot. Specifically, no evidence of stress  fracture. 2. Small volume fluid within the second and third intermetatarsal spaces, which may reflect mild bursitis.     Electronically Signed   By: Duanne Guess D.O.   On: 11/02/2020 18:43   Assessment:   1. Stress reaction of bone   2. Chronic foot pain, right   3. Pes planus of both feet   4. Gastrocnemius equinus of right lower extremity    Plan:  Patient was evaluated and treated and all questions answered.  ?Stress fracture right foot -MRI reviewed with patient. No osseous abnormality to explain patient's symptoms. This may be overuse injury as it is made worse after working all day. He has CMOs which do not help. -Rx medrol pack to decrease inflammation.  No follow-ups on file.

## 2020-11-11 ENCOUNTER — Other Ambulatory Visit: Payer: Self-pay

## 2020-11-11 ENCOUNTER — Encounter (INDEPENDENT_AMBULATORY_CARE_PROVIDER_SITE_OTHER): Payer: Self-pay | Admitting: Family Medicine

## 2020-11-11 ENCOUNTER — Ambulatory Visit (INDEPENDENT_AMBULATORY_CARE_PROVIDER_SITE_OTHER): Payer: Medicare Other | Admitting: Family Medicine

## 2020-11-11 VITALS — BP 122/75 | HR 65 | Temp 97.7°F | Ht 71.0 in | Wt >= 6400 oz

## 2020-11-11 DIAGNOSIS — Z6841 Body Mass Index (BMI) 40.0 and over, adult: Secondary | ICD-10-CM

## 2020-11-11 DIAGNOSIS — F439 Reaction to severe stress, unspecified: Secondary | ICD-10-CM | POA: Diagnosis not present

## 2020-11-11 DIAGNOSIS — E8881 Metabolic syndrome: Secondary | ICD-10-CM

## 2020-11-11 MED ORDER — METFORMIN HCL 500 MG PO TABS: 500.0000 mg | ORAL_TABLET | Freq: Every day | ORAL | 0 refills | Status: DC

## 2020-11-12 NOTE — Progress Notes (Signed)
Chief Complaint:   OBESITY Robert Stokes is here to discuss his progress with his obesity treatment plan along with follow-up of his obesity related diagnoses. Robert Stokes is on the Category 4 Plan and states he is following his eating plan approximately 5% of the time. Robert Stokes states he is doing swimming walking for 60 minutes 7 times per week.  Today's visit was #: 5 Starting weight: 412 lbs Starting date: 08/21/2020 Today's weight: 410 lbs Today's date: 11/11/2020 Total lbs lost to date: 2 Total lbs lost since last in-office visit: 0  Interim History: Robert Stokes hasn't been able to concentrate on weight loss. He has a lot of stress and he is feeling overwhelmed. He is trying to walk but he has a foot injury.  Subjective:   1. Insulin resistance Robert Stokes is struggling with weight loss. His last fasting insulin was very elevated.  2. Stress Robert Stokes is under a lot of stress with health issues, making him unable to work. He is sleeping poorly and this is affecting his ability to lose weight.  Assessment/Plan:   1. Insulin resistance Robert Stokes agreed to start metformin 500 mg q AM with food, with no refills. He will continue to work on weight loss, exercise, and decreasing simple carbohydrates to help decrease the risk of diabetes. Robert Stokes agreed to follow-up with Robert Stokes as directed to closely monitor his progress.  - metFORMIN (GLUCOPHAGE) 500 MG tablet; Take 1 tablet (500 mg total) by mouth daily with breakfast.  Dispense: 30 tablet; Refill: 0  2. Stress Robert Stokes's meal plan was adjusted to help him stay on track and hopefully decrease his stress level. He will continue to follow up as directed.  3. Obesity with current BMI 57.3 Robert Stokes is currently in the action stage of change. As such, his goal is to continue with weight loss efforts. He has agreed to keeping a food journal and adhering to recommended goals of 1800-2300 calories and 120+ grams of protein daily.   Exercise goals: As is.  Behavioral  modification strategies: increasing lean protein intake.  Robert Stokes has agreed to follow-up with our clinic in 3 weeks. He was informed of the importance of frequent follow-up visits to maximize his success with intensive lifestyle modifications for his multiple health conditions.   Objective:   Blood pressure 122/75, pulse 65, temperature 97.7 F (36.5 C), height 5\' 11"  (1.803 m), weight (!) 410 lb (186 kg), SpO2 96 %. Body mass index is 57.18 kg/m.  General: Cooperative, alert, well developed, in no acute distress. HEENT: Conjunctivae and lids unremarkable. Cardiovascular: Regular rhythm.  Lungs: Normal work of breathing. Neurologic: No focal deficits.   Lab Results  Component Value Date   CREATININE 0.76 09/01/2020   BUN 15 09/01/2020   NA 139 09/01/2020   K 3.8 09/01/2020   CL 102 09/01/2020   CO2 27 09/01/2020   Lab Results  Component Value Date   ALT 29 08/21/2020   AST 18 08/21/2020   ALKPHOS 109 08/21/2020   BILITOT 0.4 08/21/2020   Lab Results  Component Value Date   HGBA1C 5.0 08/21/2020   Lab Results  Component Value Date   INSULIN 23.4 08/21/2020   Lab Results  Component Value Date   TSH 2.870 08/21/2020   Lab Results  Component Value Date   CHOL 184 08/21/2020   HDL 39 (L) 08/21/2020   LDLCALC 126 (H) 08/21/2020   TRIG 104 08/21/2020   Lab Results  Component Value Date   VD25OH 12.2 (L) 08/21/2020  Lab Results  Component Value Date   WBC 9.2 09/01/2020   HGB 14.3 09/01/2020   HCT 45.7 09/01/2020   MCV 88.2 09/01/2020   PLT 246 09/01/2020   No results found for: IRON, TIBC, FERRITIN  Obesity Behavioral Intervention:   Approximately 15 minutes were spent on the discussion below.  ASK: We discussed the diagnosis of obesity with Robert Stokes today and Robert Stokes agreed to give Robert Stokes permission to discuss obesity behavioral modification therapy today.  ASSESS: Robert Stokes has the diagnosis of obesity and his BMI today is 57.21. Robert Stokes is in the action  stage of change.   ADVISE: Robert Stokes was educated on the multiple health risks of obesity as well as the benefit of weight loss to improve his health. He was advised of the need for long term treatment and the importance of lifestyle modifications to improve his current health and to decrease his risk of future health problems.  AGREE: Multiple dietary modification options and treatment options were discussed and Robert Stokes agreed to follow the recommendations documented in the above note.  ARRANGE: Robert Stokes was educated on the importance of frequent visits to treat obesity as outlined per CMS and USPSTF guidelines and agreed to schedule his next follow up appointment today.  Attestation Statements:   Reviewed by clinician on day of visit: allergies, medications, problem list, medical history, surgical history, family history, social history, and previous encounter notes.   I, Burt Knack, am acting as transcriptionist for Quillian Quince, MD.  I have reviewed the above documentation for accuracy and completeness, and I agree with the above. -  Quillian Quince, MD

## 2020-11-20 ENCOUNTER — Encounter: Payer: Self-pay | Admitting: Physical Therapy

## 2020-11-20 ENCOUNTER — Ambulatory Visit: Payer: Medicare Other | Attending: Podiatry | Admitting: Physical Therapy

## 2020-11-20 ENCOUNTER — Other Ambulatory Visit: Payer: Self-pay

## 2020-11-20 DIAGNOSIS — M6281 Muscle weakness (generalized): Secondary | ICD-10-CM | POA: Diagnosis present

## 2020-11-20 DIAGNOSIS — R2689 Other abnormalities of gait and mobility: Secondary | ICD-10-CM | POA: Diagnosis present

## 2020-11-20 DIAGNOSIS — M79671 Pain in right foot: Secondary | ICD-10-CM | POA: Diagnosis present

## 2020-11-20 NOTE — Patient Instructions (Signed)
Access Code: [email or web address] URL: [email or web address] Date: 11/20/2020 Prepared by: Alphonzo Severance  Exercises Seated Toe Towel Scrunches - 2 x daily - 7 x weekly - 3 sets - 10 reps Ankle and Toe Plantarflexion with Resistance - 2 x daily - 7 x weekly - 3 sets - 10 reps

## 2020-11-20 NOTE — Therapy (Signed)
[facility] [place], [place], [address] Phone: [phone]   Fax:  [phone]  Physical Therapy Evaluation  Patient Details  Name: Robert Stokes MRN: [id number] Date of Birth: January 26, 1991 Referring Provider (PT): Criselda Peaches, [place]  Encounter Date: 11/20/2020   PT End of Session - 11/20/20 1613     Visit Number 1    Number of Visits 16    Date for PT Re-Evaluation 01/15/21    Authorization Type UHC MCR - FOTO    PT Start Time 1300    PT Stop Time 1345    PT Time Calculation (min) 45 min    Activity Tolerance Patient limited by pain    Behavior During Therapy [facility] for tasks assessed/performed             Past Medical History:  Diagnosis Date   ADD (attention deficit disorder)    ADHD    Asthma    Back pain    Joint pain    Obesity    Sleep apnea    SOB (shortness of breath)    Swelling of lower extremity     Past Surgical History:  Procedure Laterality Date   ADENOIDECTOMY     TONSILLECTOMY      There were no vitals filed for this visit.    Subjective Assessment - 11/20/20 1609     Subjective Robert Stokes is a 30 y.o. male who presents to clinic with chief complaint of R foot pain.  MOI/History of condition: history of R foot pain which started about 1 year ago.  No trauma.  Tried a brace and boot which have not helped.  Used custom orthotics and supportive shoes with no help.  Both his boots have broken soon after getting them.  Pain location: Diffuse R foot pain, cental dosum ~3rd met.  Red flags: denies.  48 hour pain intensity:  highest 10/10, current 8.5/10, best 8/10.  Aggs: walking, standing.  Eases: rest.  Nature: "elephant sitting on it with a car backing up and down".  Severity: high.  Irritability: high.  Stage: chronic.  Stability: getting worse.  24 hour pattern: worse with activity.  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  Hobbies:  hiking - now limited to video games and netflix in bed d/t pain.  Functional limitations/goals: cooking, cleaning, standing.  Home environment: lives with roomates, no steps.  Assistive device: has used crutches and cane, no help.   Hand dominance: R.  Falls: none.    Pertinent History Significant PMH: history of joint and back pain    Diagnostic tests MRI IMPRESSION:  1. No acute osseous abnormality of the right forefoot. Specifically,  no evidence of stress fracture.  2. Small volume fluid within the second and third intermetatarsal  spaces, which may reflect mild bursitis.                [facility] PT Assessment - 11/20/20 0001       Assessment   Medical Diagnosis Stress reaction of bone (M84.30XA), Chronic foot pain, right (M79.671, G89.29), Pes planus of both feet (M21.41, M21.42), Gastrocnemius equinus of right lower extremity (X83.3X8)    Referring Provider (PT) Criselda Peaches, DPM    Onset Date/Surgical Date 11/21/19    Hand Dominance Right    Next MD Visit 9/16    Prior Therapy none      Precautions   Precaution Comments Fall risk?  Restrictions   Weight Bearing Restrictions No      Balance Screen   Has the patient fallen in the past 6 months No      Observation/Other Assessments   Observations Wearing fracture boot with top strap broken, antalgic gait, avoiding w/b through R forefoot    Focus on Therapeutic Outcomes (FOTO)  35 - > 58      ROM / Strength   AROM / PROM / Strength AROM;Strength      AROM   Overall AROM Comments L ankle WFL    AROM Assessment Site Ankle    Right/Left Ankle Right;Left    Left Ankle Dorsiflexion --   26 degrees CC with high P!   Left Ankle Inversion 20   P!   Left Ankle Eversion 10   P!     Strength   Overall Strength Comments unable to test ankle strength reliably d/t pain      Palpation   Palpation comment exquisite tenderness over 3rd met                        Objective measurements completed on  examination: See above findings.               PT Education - 11/20/20 1609     Education Details POC, diagnosis, prognosis, HEP, FOTO.  Pt educated via explanation, demonstration, and handout (HEP).  Pt confirms understanding verbally.                 PT Long Term Goals - 11/20/20 1616       PT LONG TERM GOAL #1   Title Robert Stokes will be >75% HEP compliant throughout therapy to improve carryover between sessions and facilitate independent management of condition.    Target Date 01/15/21      PT LONG TERM GOAL #2   Title Robert Stokes will achieve greater than 40 degrees of closed chain ankle DF of the affected ankle (inclinometer placed just distal to tibial tuberosity; "zeroed" vertically; 1/2 kneeling if able; measured on forward leg) to normalize terminal stance phase of gait and reduce stress on posterior ankle structures  EVAL: 26 degrees in standing    Target Date 01/15/21      PT LONG TERM GOAL #3   Title Robert Stokes will report >/= 50% decrease in pain from evaluation  EVAL: 10/10 max pain, 8/10 lowest    Target Date 01/15/21      PT LONG TERM GOAL #4   Title Robert Stokes will be able to stand for 30 min while making a meal, not limited by pain  EVAL: <5 min    Target Date 01/15/21      PT LONG TERM GOAL #5   Title Robert Stokes will improve FOTO score from 35 (on evaluation) to 58 as a proxy for functional improvement    Target Date 01/15/21                    Plan - 11/20/20 1614     Clinical Impression Statement Robert Stokes is a 30 y.o. male who presents to clinic with signs and sxs consistent with R foot pain which is diffuse around 3rd met.  Imaging reveals no significant pathology.  Pain limits rigor of exam significantly, but there appear to be no significant red flags.  He is in a difficult situation as he is virtually unable to tolerate any weight bearing  through the forefoot.  Immobilization, orthotics, and  supportive footwear have not helped.  We will try a split between land and aquatic therapy.  Pt presents with pain and impairments/deficits in: ankle ROM, ankle strength.  Activity limitations include: walking, steps, standing.  Participation limitations include: cooking, cleaning, working.  Pt will benefit from skilled therapy to address pain and the listed deficits in order to achieve functional goals, enable safety and independence in completion of daily tasks, and return to PLOF.    Personal Factors and Comorbidities Comorbidity 1    Comorbidities BMI    Stability/Clinical Decision Making Stable/Uncomplicated    Clinical Decision Making Low    Rehab Potential Fair    PT Frequency 2x / week    PT Duration 8 weeks    PT Treatment/Interventions Aquatic Therapy;ADLs/Self Care Home Management;Iontophoresis 74m/ml Dexamethasone;Gait training;Therapeutic activities;Therapeutic exercise;Neuromuscular re-education;Manual techniques;Dry needling;Vasopneumatic Device;Joint Manipulations    PT Next Visit Plan gradual non w/b strengthening of ankle and foot intrinsics, try ionto?, balance as able, look into weight managment  Aquatic general strenghening and gradual progressive w/b in pool    PT Home Exercise Plan [email or web address]    Consulted and Agree with Plan of Care Patient             Patient will benefit from skilled therapeutic intervention in order to improve the following deficits and impairments:  Abnormal gait, Pain, Decreased range of motion, Decreased strength, Obesity, Difficulty walking  Visit Diagnosis: Pain in right foot  Muscle weakness  Other abnormalities of gait and mobility     Problem List Patient Active Problem List   Diagnosis Date Noted   Super obese 10/24/2020   Sleep related headaches 10/24/2020   Episodic cluster headache, not intractable 10/24/2020   Sleeps in sitting position due to orthopnea 10/24/2020   Insomnia due to medical condition 10/24/2020   Loud  snoring 10/24/2020   Excessive daytime sleepiness 10/24/2020   Other fatigue 08/27/2020   Hyperglycemia 08/27/2020   SOB (shortness of breath) on exertion 08/27/2020   Vitamin D deficiency 08/27/2020   Moderate left ankle sprain 01/13/2019    KShearon BaloPT, DPT 11/20/20 4:24 PM  [facility] [place] [address]Phone: [phone]  Fax:  [phone] Name: Robert COLLEGEMRN: [id number] of Birth: [birth year]

## 2020-11-27 ENCOUNTER — Ambulatory Visit: Payer: Medicare Other

## 2020-11-27 ENCOUNTER — Other Ambulatory Visit: Payer: Self-pay

## 2020-11-27 ENCOUNTER — Ambulatory Visit (INDEPENDENT_AMBULATORY_CARE_PROVIDER_SITE_OTHER): Payer: Medicare Other | Admitting: Neurology

## 2020-11-27 DIAGNOSIS — R519 Headache, unspecified: Secondary | ICD-10-CM

## 2020-11-27 DIAGNOSIS — G44019 Episodic cluster headache, not intractable: Secondary | ICD-10-CM

## 2020-11-27 DIAGNOSIS — R0601 Orthopnea: Secondary | ICD-10-CM

## 2020-11-27 DIAGNOSIS — E669 Obesity, unspecified: Secondary | ICD-10-CM

## 2020-11-27 DIAGNOSIS — M79671 Pain in right foot: Secondary | ICD-10-CM

## 2020-11-27 DIAGNOSIS — G4701 Insomnia due to medical condition: Secondary | ICD-10-CM

## 2020-11-27 DIAGNOSIS — G4719 Other hypersomnia: Secondary | ICD-10-CM

## 2020-11-27 DIAGNOSIS — R0683 Snoring: Secondary | ICD-10-CM

## 2020-11-27 DIAGNOSIS — R2689 Other abnormalities of gait and mobility: Secondary | ICD-10-CM

## 2020-11-27 DIAGNOSIS — M6281 Muscle weakness (generalized): Secondary | ICD-10-CM

## 2020-11-27 DIAGNOSIS — G4733 Obstructive sleep apnea (adult) (pediatric): Secondary | ICD-10-CM | POA: Diagnosis not present

## 2020-11-27 NOTE — Therapy (Signed)
[facility] [address] [place], [place], [address] Phone: [phone]   Fax:  [phone]  Physical Therapy Treatment  Patient Details  Name: Robert Stokes MRN: [id number] Date of Birth: 06-09-1990 Referring Provider (PT): Robert Stokes, [place]   Encounter Date: 11/27/2020   PT End of Session - 11/27/20 1452     Visit Number 2    Number of Visits 16    Date for PT Re-Evaluation 01/15/21    Authorization Type UHC MCR - FOTO    PT Start Time 1451   arrived late   PT Stop Time 1530    PT Time Calculation (min) 39 min    Activity Tolerance Patient limited by pain    Behavior During Therapy [facility] for tasks assessed/performed             Past Medical History:  Diagnosis Date   ADD (attention deficit disorder)    ADHD    Asthma    Back pain    Joint pain    Obesity    Sleep apnea    SOB (shortness of breath)    Swelling of lower extremity     Past Surgical History:  Procedure Laterality Date   ADENOIDECTOMY     TONSILLECTOMY      There were no vitals filed for this visit.   Subjective Assessment - 11/27/20 1452     Subjective Pt presents to PT with reports of continued severe R foot pain and discomfort. He has been compliant with his HEP, but notes that it significantly increases his R foot pain when performing. Pt is ready to beign PT at this time.    Currently in Pain? Yes    Pain Score 9     Pain Location Foot    Pain Orientation Right           [facility] Adult PT Treatment/Exercise:   Therapeutic Exercise:  BAPS cw/ccw x 10 Towel inv/ev x 5 ea Towel crunches x 10 Seated heel toe raises 2x20 Bilat ankle DF/inv/PF                                 PT Long Term Goals - 11/20/20 1616       PT LONG TERM GOAL #1   Title Robert Stokes will be >75% HEP compliant throughout therapy to improve carryover between sessions and facilitate independent management of condition.     Target Date 01/15/21      PT LONG TERM GOAL #2   Title Robert Stokes will achieve greater than 40 degrees of closed chain ankle DF of the affected ankle (inclinometer placed just distal to tibial tuberosity; "zeroed" vertically; 1/2 kneeling if able; measured on forward leg) to normalize terminal stance phase of gait and reduce stress on posterior ankle structures  EVAL: 26 degrees in standing    Target Date 01/15/21      PT LONG TERM GOAL #3   Title Robert Stokes will report >/= 50% decrease in pain from evaluation  EVAL: 10/10 max pain, 8/10 lowest    Target Date 01/15/21      PT LONG TERM GOAL #4   Title Robert Stokes will be able to stand for 30 min while making a meal, not limited by pain  EVAL: <5 min    Target Date 01/15/21      PT LONG TERM GOAL #5   Title  Robert Stokes will improve FOTO score from 35 (on evaluation) to 83 as a proxy for functional improvement    Target Date 01/15/21                   Plan - 11/27/20 1457     Clinical Impression Statement Pt was able to complete prescribed exercises with no change in baseline pain. He continues to show decreased activity tolerance and continued severe R foot pain. Today we focused on increasing foot intrinsic strength and overall endurance. Will continue to progress as tolerated per POC.    PT Treatment/Interventions Aquatic Therapy;ADLs/Self Care Home Management;Iontophoresis 4mg /ml Dexamethasone;Gait training;Therapeutic activities;Therapeutic exercise;Neuromuscular re-education;Manual techniques;Dry needling;Vasopneumatic Device;Joint Manipulations    PT Next Visit Plan gradual non w/b strengthening of ankle and foot intrinsics, try ionto?, balance as able, look into weight managment  Aquatic general strenghening and gradual progressive w/b in pool    PT Home Exercise Plan [email or web address]             Patient will benefit from skilled therapeutic intervention in order to improve the following deficits and  impairments:  Abnormal gait, Pain, Decreased range of motion, Decreased strength, Obesity, Difficulty walking  Visit Diagnosis: Pain in right foot  Muscle weakness  Other abnormalities of gait and mobility     Problem List Patient Active Problem List   Diagnosis Date Noted   Super obese 10/24/2020   Sleep related headaches 10/24/2020   Episodic cluster headache, not intractable 10/24/2020   Sleeps in sitting position due to orthopnea 10/24/2020   Insomnia due to medical condition 10/24/2020   Loud snoring 10/24/2020   Excessive daytime sleepiness 10/24/2020   Other fatigue 08/27/2020   Hyperglycemia 08/27/2020   SOB (shortness of breath) on exertion 08/27/2020   Vitamin D deficiency 08/27/2020   Moderate left ankle sprain 01/13/2019    01/15/2019, PT, DPT 11/27/20 4:50 PM  [facility] [address] [place], [place], [place] Phone: [phone]   Fax:  [phone]  Name: Robert Stokes MRN: Robert Stokes Date of Birth: 1990-09-17

## 2020-12-03 ENCOUNTER — Ambulatory Visit: Payer: Medicare Other | Attending: Podiatry

## 2020-12-03 ENCOUNTER — Encounter (INDEPENDENT_AMBULATORY_CARE_PROVIDER_SITE_OTHER): Payer: Self-pay | Admitting: Family Medicine

## 2020-12-03 ENCOUNTER — Ambulatory Visit (INDEPENDENT_AMBULATORY_CARE_PROVIDER_SITE_OTHER): Payer: Medicare Other | Admitting: Physician Assistant

## 2020-12-03 ENCOUNTER — Other Ambulatory Visit: Payer: Self-pay

## 2020-12-03 ENCOUNTER — Ambulatory Visit (INDEPENDENT_AMBULATORY_CARE_PROVIDER_SITE_OTHER): Payer: Medicare Other | Admitting: Family Medicine

## 2020-12-03 VITALS — BP 128/79 | HR 71 | Temp 97.7°F | Ht 71.0 in | Wt >= 6400 oz

## 2020-12-03 DIAGNOSIS — E8881 Metabolic syndrome: Secondary | ICD-10-CM

## 2020-12-03 DIAGNOSIS — M6281 Muscle weakness (generalized): Secondary | ICD-10-CM | POA: Diagnosis present

## 2020-12-03 DIAGNOSIS — R2689 Other abnormalities of gait and mobility: Secondary | ICD-10-CM | POA: Insufficient documentation

## 2020-12-03 DIAGNOSIS — G4700 Insomnia, unspecified: Secondary | ICD-10-CM

## 2020-12-03 DIAGNOSIS — M79671 Pain in right foot: Secondary | ICD-10-CM | POA: Insufficient documentation

## 2020-12-03 DIAGNOSIS — Z6841 Body Mass Index (BMI) 40.0 and over, adult: Secondary | ICD-10-CM | POA: Diagnosis not present

## 2020-12-03 MED ORDER — METFORMIN HCL 500 MG PO TABS: 500.0000 mg | ORAL_TABLET | Freq: Two times a day (BID) | ORAL | 0 refills | Status: DC

## 2020-12-03 NOTE — Progress Notes (Signed)
Chief Complaint:   OBESITY Robert Stokes is here to discuss his progress with his obesity treatment plan along with follow-up of his obesity related diagnoses. Robert Stokes is on keeping a food journal and adhering to recommended goals of 1800-2300 calories and 120+ grams of protein daily and states he is following his eating plan approximately 30% of the time. Robert Stokes states he is doing physical therapy and swimming for 45-90 minutes 5 times per week.  Today's visit was #: 6 Starting weight: 412 lbs Starting date: 08/21/2020 Today's weight: 411 lbs Today's date: 12/03/2020 Total lbs lost to date: 2 Total lbs lost since last in-office visit: 0  Interim History: Robert Stokes has struggled to stay on his eating plan. He has worked to minimize weight gain. His sleep is poor which is likely contributing to his weight loss struggle.  Subjective:   1. Insomnia, unspecified type Robert Stokes has problems with both falling asleep and staying asleep. He often only sleeps 3-4 hours per night, and this has worsened in the last 2 months. He has had a sleep study done and he is awaiting the results.  2. Insulin resistance Robert Stokes is on metformin in the morning with no side effects noted, but no change in polyphagia.  Assessment/Plan:   1. Insomnia, unspecified type The problem of recurrent insomnia was discussed. Orders and follow up as documented in patient record. Counseling: Intensive lifestyle modifications are the first line treatment for this issue. We discussed several lifestyle modifications today. We will review his sleep study when the report is done, and Robert Stokes will continue to work on diet and weight loss in the meanwhile.   2. Insulin resistance Robert Stokes agreed to increase metformin to 500 mg BID with no refills. He will continue to work on weight loss, exercise, and decreasing simple carbohydrates to help decrease the risk of diabetes. Robert Stokes agreed to follow-up with [name] as directed to closely monitor his  progress.  3. Obesity with current BMI 57.3 Robert Stokes is currently in the action stage of change. As such, his goal is to continue with weight loss efforts. He has agreed to keeping a food journal and adhering to recommended goals of 1800-2300 calories and 120+ grams of protein daily.   Exercise goals: As is.  Behavioral modification strategies: increasing lean protein intake and meal planning and cooking strategies.  Robert Stokes has agreed to follow-up with our clinic in 3 weeks. He was informed of the importance of frequent follow-up visits to maximize his success with intensive lifestyle modifications for his multiple health conditions.   Objective:   Blood pressure 128/79, pulse 71, temperature 97.7 F (36.5 C), height 5\' 11"  (1.803 m), weight (!) 411 lb (186.4 kg), SpO2 96 %. Body mass index is 57.32 kg/m.  General: Cooperative, alert, well developed, in no acute distress. HEENT: Conjunctivae and lids unremarkable. Cardiovascular: Regular rhythm.  Lungs: Normal work of breathing. Neurologic: No focal deficits.   Lab Results  Component Value Date   CREATININE 0.76 09/01/2020   BUN 15 09/01/2020   NA 139 09/01/2020   K 3.8 09/01/2020   CL 102 09/01/2020   CO2 27 09/01/2020   Lab Results  Component Value Date   ALT 29 08/21/2020   AST 18 08/21/2020   ALKPHOS 109 08/21/2020   BILITOT 0.4 08/21/2020   Lab Results  Component Value Date   HGBA1C 5.0 08/21/2020   Lab Results  Component Value Date   INSULIN 23.4 08/21/2020   Lab Results  Component Value Date  TSH 2.870 08/21/2020   Lab Results  Component Value Date   CHOL 184 08/21/2020   HDL 39 (L) 08/21/2020   LDLCALC 126 (H) 08/21/2020   TRIG 104 08/21/2020   Lab Results  Component Value Date   VD25OH 12.2 (L) 08/21/2020   Lab Results  Component Value Date   WBC 9.2 09/01/2020   HGB 14.3 09/01/2020   HCT 45.7 09/01/2020   MCV 88.2 09/01/2020   PLT 246 09/01/2020   No results found for: IRON, TIBC,  FERRITIN  Obesity Behavioral Intervention:   Approximately 15 minutes were spent on the discussion below.  ASK: We discussed the diagnosis of obesity with [name] today and Ediel agreed to give [name] permission to discuss obesity behavioral modification therapy today.  ASSESS: Ringo has the diagnosis of obesity and his BMI today is 29.3. Giles is in the action stage of change.   ADVISE: Ariq was educated on the multiple health risks of obesity as well as the benefit of weight loss to improve his health. He was advised of the need for long term treatment and the importance of lifestyle modifications to improve his current health and to decrease his risk of future health problems.  AGREE: Multiple dietary modification options and treatment options were discussed and Nilo agreed to follow the recommendations documented in the above note.  ARRANGE: Donoven was educated on the importance of frequent visits to treat obesity as outlined per CMS and USPSTF guidelines and agreed to schedule his next follow up appointment today.  Attestation Statements:   Reviewed by clinician on day of visit: allergies, medications, problem list, medical history, surgical history, family history, social history, and previous encounter notes.   I, Burt Knack, am acting as transcriptionist for Quillian Quince, MD.  I have reviewed the above documentation for accuracy and completeness, and I agree with the above. -  Quillian Quince, MD

## 2020-12-03 NOTE — Therapy (Signed)
[facility] [address] [place], [place], [address] Phone: [phone]   Fax:  [phone]  Physical Therapy Treatment  Patient Details  Name: Robert Stokes MRN: [id number] Date of Birth: 03-12-1991 Referring Provider (PT): Robert Stokes, [place]   Encounter Date: 12/03/2020   PT End of Session - 12/03/20 1734     Visit Number 3    Number of Visits 16    Date for PT Re-Evaluation 01/15/21    Authorization Type UHC MCR - FOTO    PT Start Time 1740    PT Stop Time 1835    PT Time Calculation (min) 55 min    Activity Tolerance Patient limited by pain    Behavior During Therapy Robert Stokes for tasks assessed/performed             Past Medical History:  Diagnosis Date   ADD (attention deficit disorder)    ADHD    Asthma    Back pain    Joint pain    Obesity    Sleep apnea    SOB (shortness of breath)    Swelling of lower extremity     Past Surgical History:  Procedure Laterality Date   ADENOIDECTOMY     TONSILLECTOMY      There were no vitals filed for this visit.   Subjective Assessment - 12/03/20 1734     Subjective Pt presents to PT with continued reports of severe R foot pain and discomfort. He has been compliant with his HEP with no adverse effect. Pt is ready to begin PT at this time.    Currently in Pain? Yes    Pain Score 9     Pain Location Foot           [facility] Adult PT Treatment/Exercise:   Therapeutic Exercise:  LAQ 2x15 ea BAPS cw/ccw x 10 Towel inv/ev x 5 ea Towel crunches 2x30 sec Seated heel toe raises 2x20 Seated PF YTB 2x20 Manual Therapy: First ray mobilization grade III  STM to R fibularis, R distal LE Modalities: Gameready: 40 degrees; med compression x 10 min                                 PT Long Term Goals - 11/20/20 1616       PT LONG TERM GOAL #1   Title Robert Stokes will be >75% HEP compliant throughout therapy to improve carryover between  sessions and facilitate independent management of condition.    Target Date 01/15/21      PT LONG TERM GOAL #2   Title Robert Stokes will achieve greater than 40 degrees of closed chain ankle DF of the affected ankle (inclinometer placed just distal to tibial tuberosity; "zeroed" vertically; 1/2 kneeling if able; measured on forward leg) to normalize terminal stance phase of gait and reduce stress on posterior ankle structures  EVAL: 26 degrees in standing    Target Date 01/15/21      PT LONG TERM GOAL #3   Title Robert Stokes will report >/= 50% decrease in pain from evaluation  EVAL: 10/10 max pain, 8/10 lowest    Target Date 01/15/21      PT LONG TERM GOAL #4   Title Robert Stokes will be able to stand for 30 min while making a meal, not limited by pain  EVAL: <5 min    Target Date 01/15/21  PT LONG TERM GOAL #5   Title Robert Stokes will improve FOTO score from 35 (on evaluation) to 58 as a proxy for functional improvement    Target Date 01/15/21                   Plan - 12/04/20 0816     Clinical Impression Statement Pt tolerated therapeutic exercises fair, but noted no change in baseline pain. He did respond well to manual therapy intervention, noting decrease in pain to 4/10, especially with IASTM to fibularis group and distal lateral R LE. Unfortunately, pt noted similar pain when rising from sitting position at end of session and had difficulty ambulating to clinic entrance. Pt has first aquatic therapy session tomorrow, with therapy then alternating between gym and pool. Will continue to progress as tolerated per POC.    PT Treatment/Interventions Aquatic Therapy;ADLs/Self Care Home Management;Iontophoresis 4mg /ml Dexamethasone;Gait training;Therapeutic activities;Therapeutic exercise;Neuromuscular re-education;Manual techniques;Dry needling;Vasopneumatic Device;Joint Manipulations    PT Next Visit Plan gradual non w/b strengthening of ankle and foot  intrinsics, try ionto?, balance as able, look into weight managment  Aquatic general strenghening and gradual progressive w/b in pool    PT Home Exercise Plan [id number]             Patient will benefit from skilled therapeutic intervention in order to improve the following deficits and impairments:  Abnormal gait, Pain, Decreased range of motion, Decreased strength, Obesity, Difficulty walking  Visit Diagnosis: Pain in right foot  Muscle weakness  Other abnormalities of gait and mobility     Problem List Patient Active Problem List   Diagnosis Date Noted   Super obese 10/24/2020   Sleep related headaches 10/24/2020   Episodic cluster headache, not intractable 10/24/2020   Sleeps in sitting position due to orthopnea 10/24/2020   Insomnia due to medical condition 10/24/2020   Loud snoring 10/24/2020   Excessive daytime sleepiness 10/24/2020   Other fatigue 08/27/2020   Hyperglycemia 08/27/2020   SOB (shortness of breath) on exertion 08/27/2020   Vitamin D deficiency 08/27/2020   Moderate left ankle sprain 01/13/2019    01/15/2019, PT, DPT 12/04/20 8:21 AM  [facility] [address] [place], [place], [place] Phone: [phone]   Fax:  [phone]  Name: Robert Stokes MRN: Robert Stokes Date of Birth: 1990-11-10

## 2020-12-04 ENCOUNTER — Encounter: Payer: Self-pay | Admitting: Physical Therapy

## 2020-12-04 ENCOUNTER — Ambulatory Visit: Payer: Medicare Other | Admitting: Physical Therapy

## 2020-12-04 DIAGNOSIS — M79671 Pain in right foot: Secondary | ICD-10-CM | POA: Diagnosis not present

## 2020-12-04 DIAGNOSIS — M6281 Muscle weakness (generalized): Secondary | ICD-10-CM

## 2020-12-04 DIAGNOSIS — R2689 Other abnormalities of gait and mobility: Secondary | ICD-10-CM

## 2020-12-04 NOTE — Therapy (Signed)
[facility] [address] [place], [place], [address] Phone: [phone]   Fax:  [phone]  Physical Therapy Treatment  Patient Details  Name: Robert Stokes MRN: [id number] Date of Birth: 02-Oct-1990 Referring Provider (PT): Edwin Cap, [place]   Encounter Date: 12/04/2020   PT End of Session - 12/04/20 1544     Visit Number 4    Number of Visits 16    Date for PT Re-Evaluation 01/15/21    Authorization Type UHC MCR - FOTO    PT Start Time 1545    PT Stop Time 1632    PT Time Calculation (min) 47 min             Past Medical History:  Diagnosis Date   ADD (attention deficit disorder)    ADHD    Asthma    Back pain    Joint pain    Obesity    Sleep apnea    SOB (shortness of breath)    Swelling of lower extremity     Past Surgical History:  Procedure Laterality Date   ADENOIDECTOMY     TONSILLECTOMY      There were no vitals filed for this visit.   Subjective Assessment - 12/04/20 1544     Subjective I have a 9/10 pain in my Left cuboid  for about 18 months now. Pt states he has trouble walking on land and that he does use his apartment pool.  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  Pt stated he was at a 6/10 in the pool but after  he left pool returned to a 7/10 and was concerned he would have even worse pain after movement.  He states this a typical response when he uses his pool at home.  He does say he has relief but does not last    Pertinent History Significant PMH: history of joint and back pain    Diagnostic tests MRI IMPRESSION:  1. No acute osseous abnormality of the right forefoot. Specifically,  no evidence of stress fracture.  2. Small volume fluid within the second and third intermetatarsal  spaces, which may reflect mild bursitis.    Currently in Pain? Yes    Pain Score 9     Pain Location Foot    Pain Orientation Right    Pain Descriptors /  Indicators Sharp;Aching    Pain Onset More than a month ago    Pain Frequency Constant              Pt seen for aquatic therapy today.  Treatment took place in water 3.5-4.0 ft in depth at the [facility] pool. Temp of water was 94.  Pt entered/exited the pool via stairs independently with bilat rail. -Reviewed response to prior Rx, and pain level.     Pt ambulated with noodle 3-4 laps and sidestepped with noodle 3-4 laps with verbal and visual instruction for improved heel strike and heel to toe gait pattern.  Pt tends to ambulate with step to gait even in water and PT tried to encourage more normalized gait.  50 % wt bearing with water at waist deep water Moved to deep end with 205 wt bearing to begin step over step technique ambulation with decreased loading of R foot jt   Sitting.  PT with manual/ aqua stretch of foot. Mobilization of 3 rd - 5th ray and Great toe flex/ ext.  Pt also  sitting on edge of pool sitting at 90 90 hip flex. Knee flex.  PT AP talocrural glide as pt bears wt through ankle and forward translation of knee.   Cuboid mobilization, increased pain in pt.  LAD / R LE and distraction of ankle joint with decrease pain   Standing On edge of pool with bil UE support  Pt performed LE exercise/reinforcing HEP  Hip abd/add R/L 10 x each and then using 1 UE support Hip ext/flex with knee straight x 20, pt needing VC and TC for correct execution and sequencing Squats x 20 reps with intermittent UE support x 2 sets. Wt shifting side to side and forward / back ward.  / diagonals.   Pt tends to flex trunk forward and unweight forefoot  Gastroc and plantar fascia stretching on step x 8 mins   Runners stretch  2 x  30 sec Left and then moves into hamstring stretch  Then runners stretch on R 2 x 30 sec and then move into hamstring stretch . TC to insure proper technique.  Gait training using noodle for ue support; VC for heel strike and toe off forward amb, than backward  amb toe strike x 8 - 15 ft width           -Standing calf stretch at pool edge 3 x 20-30 seconds bilat standing on noodle     -balance challenge rotating df/pf over noodle holding to edge of pool   Worked on steps leading with R foot x 2 steps 3 rounds.   Pt in water 6/10 but increased to 7/10 at end of session   Pt requires buoyancy for support and to offload joints with strengthening exercises. Viscosity of the water is needed for resistance of strengthening; water current perturbations provides challenge to standing balance unsupported, requiring increased core activation                          Upper Extremity Functional Index Score :   /80   PT Education - 12/04/20 1656     Education Details Pt introduced to aquatic principles and therapeutic effects of water, shown basic exercise for pool with emphasis on Ankle foot AROM    Person(s) Educated Patient    Methods Explanation;Demonstration    Comprehension Verbalized understanding;Returned demonstration                 PT Long Term Goals - 11/20/20 1616       PT LONG TERM GOAL #1   Title [name] will be >75% HEP compliant throughout therapy to improve carryover between sessions and facilitate independent management of condition.    Target Date 01/15/21      PT LONG TERM GOAL #2   Title [name] will achieve greater than 40 degrees of closed chain ankle DF of the affected ankle (inclinometer placed just distal to tibial tuberosity; "zeroed" vertically; 1/2 kneeling if able; measured on forward leg) to normalize terminal stance phase of gait and reduce stress on posterior ankle structures  EVAL: 26 degrees in standing    Target Date 01/15/21      PT LONG TERM GOAL #3   Title [name] will report >/= 50% decrease in pain from evaluation  EVAL: 10/10 max pain, 8/10 lowest    Target Date 01/15/21      PT LONG TERM GOAL #4   Title TIMOFEY CARANDANG will be able to stand for 30  min  while making a meal, not limited by pain  EVAL: <5 min    Target Date 01/15/21      PT LONG TERM GOAL #5   Title Royetta CarJoseph D Tugman will improve FOTO score from 35 (on evaluation) to 58 as a proxy for functional improvement    Target Date 01/15/21                   Plan - 12/04/20 1657     Clinical Impression Statement Mr Almeta MonasChase enters aquatics for first time and was introduced to therapeutic effects and benefits  of water. Pt ambulates on land with step to gait and decreased wt shift ot R.  Pt also uses step to gait in water and worked on normalizing gait with with water hydrostatic pressure offloading joint.  Pt with marked TTP with pressure of 3rd to 5th ray and over cuboid bone.  Pt tolerated Aquastretch and mobilization/ long  arm distraction of R LE and ankle joint with decreased pain except when directly over cuboid  Pt initally 9/10 pain and then entered water  6/10  Pt did have increased pain 7/10 at end of session in water.  Pt is concerned about ongoing pain and reports that his own experience in water feels good but has no long lasting effect.  Pt does tend to repeat compensatory patterns in water and was able to correct with PT VC and TC /skilled care and guidance.  .Pt requires buoyancy for support and to offload joints with strengthening exercises. Viscosity of the water is needed for resistance of strengthening; water current perturbations provides challenge to standing balance unsupported, requiring increased core activation.  Pt will need to continue strengthening core. hip and knee.  Pt tends to pitch trunk forward and avoid push off of great toe with gait.    Personal Factors and Comorbidities Comorbidity 1    Comorbidities BMI    Rehab Potential Fair    PT Frequency 2x / week    PT Duration 8 weeks    PT Treatment/Interventions Aquatic Therapy;ADLs/Self Care Home Management;Iontophoresis 4mg /ml Dexamethasone;Gait training;Therapeutic activities;Therapeutic  exercise;Neuromuscular re-education;Manual techniques;Dry needling;Vasopneumatic Device;Joint Manipulations    PT Next Visit Plan gradual non w/b strengthening of ankle and foot intrinsics, try ionto?, balance as able, look into weight managment  Aquatic general strenghening and gradual progressive w/b in pool    PT Home Exercise Plan [id number]    Consulted and Agree with Plan of Care Patient             Patient will benefit from skilled therapeutic intervention in order to improve the following deficits and impairments:  Abnormal gait, Pain, Decreased range of motion, Decreased strength, Obesity, Difficulty walking  Visit Diagnosis: Pain in right foot  Muscle weakness  Other abnormalities of gait and mobility     Problem List Patient Active Problem List   Diagnosis Date Noted   Super obese 10/24/2020   Sleep related headaches 10/24/2020   Episodic cluster headache, not intractable 10/24/2020   Sleeps in sitting position due to orthopnea 10/24/2020   Insomnia due to medical condition 10/24/2020   Loud snoring 10/24/2020   Excessive daytime sleepiness 10/24/2020   Other fatigue 08/27/2020   Hyperglycemia 08/27/2020   SOB (shortness of breath) on exertion 08/27/2020   Vitamin D deficiency 08/27/2020   Moderate left ankle sprain 01/13/2019   Garen LahLawrie Ben Habermann, PT, ATRIC Certified Exercise Expert for the Aging Adult  12/04/20 5:16 PM Phone: [phone] Fax: [phone]   [facility] [address] [place], [place], [address] Phone: [phone]   Fax:  [phone]  Name: GEVORK AYYAD MRN: [id number] Date of Birth: [birth year]

## 2020-12-08 NOTE — Procedures (Signed)
PATIENT'S NAME:  Lauri, Till DOB:      30-May-1990      MR#:    [id number]     DATE OF RECORDING: 11/27/2020 REFERRING M.D.:  Alois Cliche PA-C Study Performed:  Split-Night Titration Study HISTORY: 10-24-2020: NORRIN SHREFFLER is a 30 y.o. male patient presenting with Excessive daytime sleepiness and poor sleep quality.  He had been diagnosed with sleep apnea about 10-12 years ago but he has not been given CPAP.  He had no follow up- this was while residing in [place]. He often goes to bed very late between 3 and 5 AM but then wakes around 6 AM and feels fully charged and energized.   He feels groggy later in the day.  He has been told by roommates that he snores, and he may suffer from obesity hypoventilation given his BMI is 56. He does have a recent acquired hairline fracture of the cuboid bone of the right foot which is painful. He denies that this affects his sleep, however. He has a past medical history of ADD (attention deficit disorder), ADHD, Asthma, Back pain, Joint pain, Super-Obesity, Sleep apnea dx but no treatment, SOB (shortness of breath), and lower extremity edema. The patient hopes to undergo bariatric surgery.     Sleep relevant medical history: Nocturia rarely- snoring, yes, dry mouth all day. Headaches in AM, Cluster headache- stabbing feeling, sharp headaches, waking him at night. Had adenoid and Tonsillectomy at age 14-12, was already snoring while in elementary school.  The patient endorsed the Epworth Sleepiness Scale at 16 points and FSS at 55/63 points.    The patient's weight 408 pounds with a height of 71 (inches), resulting in a BMI of 57.1 kg/m2. The patient's neck circumference measured 22 inches.  CURRENT MEDICATIONS: Voltaren, Mobic  PROCEDURE:  This is a multichannel digital polysomnogram utilizing the Somnostar 11.2 system.  Electrodes and sensors were applied and monitored per AASM Specifications.   EEG, EOG, Chin and Limb EMG, were sampled at 200 Hz.  ECG,  Snore and Nasal Pressure, Thermal Airflow, Respiratory Effort, CPAP Flow and Pressure, Oximetry was sampled at 50 Hz. Digital video and audio were recorded.      BASELINE STUDY WITHOUT CPAP RESULTS: Lights Out was at 22:04 and Lights On at 05:31.  Total recording time (TRT) was 235, with a total sleep time (TST) of 186.5 minutes.   The patient's sleep latency was 37 minutes.  REM latency was 60.5 minutes.  The sleep efficiency was 79.4 %.    SLEEP ARCHITECTURE: WASO (Wake after sleep onset) was 12.5 minutes, Stage N1 was 22 minutes, Stage N2 was 103 minutes, Stage N3 was 49.5 minutes and Stage R (REM sleep) was 12 minutes.  The percentages were Stage N1 11.8%, Stage N2 55.2%, Stage N3 26.5% and Stage R (REM sleep) 6.4%.   RESPIRATORY ANALYSIS:  There were a total of 63 respiratory events:  An apnea index (AI) of 0. There were 63 hypopneas with a hypopnea index of 20.3. The patient also had some respiratory event related arousals (RERAs).     The total APNEA/HYPOPNEA INDEX (AHI) was 20.3 /hour and the total RESPIRATORY DISTURBANCE INDEX was 23.3 /hour.   15 events occurred in REM sleep and 96 events in NREM.  The REM AHI was 75.0 /hour versus a non-REM AHI of 16.5 /hour.  The patient spent all 323 minutes sleep time in the supine position, no minutes in non-supine.   OXYGEN SATURATION & C02:  The wake  baseline 02 saturation was 93%, with the lowest being 59%. Time spent below 89% saturation equaled 142 minutes.  PERIODIC LIMB MOVEMENTS: The patient had a total of 0 Periodic Limb Movements.   The arousals were noted as: 37 were spontaneous, 0 were associated with PLMs, 15 were associated with respiratory events. Audio and video analysis did not show any abnormal or unusual movements, behaviors, phonations or vocalizations. Loud Snoring was noted. The EKG was in regular rhythm.   TITRATION STUDY WITH CPAP RESULTS:  CPAP was initiated at 5 cmH20 with heated humidity and use of a ResMed F30 I medium  sized mask. Per AASM split night standards the PAP pressure was advanced to 12 cmH20 because of hypopneas and desaturations. The patient reported he felt short of breath at any of those pressures explored.  The hypoxia improved in non-REM sleep but was still critical during REM sleep-    At a PAP pressure of 12 cmH20, there was a reduction of the AHI to 0.0/h but no REM sleep was observed. REM sleep was observed between 8 and 10 cm water pressure of PAP and AHI range was from 17.6- though 6.2/h. and REM nadir was 77% SpO2.   Total recording time (TRT) was 212.5 minutes, with a total sleep time (TST) of 136.5 minutes. The patient's sleep latency was 64 minutes. REM latency was 34.5 minutes.  The sleep efficiency was 64.2 %.    SLEEP ARCHITECTURE: Wake after sleep was 12 minutes, Stage N1 19 minutes, Stage N2 98.5 minutes, Stage N3 0 minutes and Stage R (REM sleep) 19 minutes. The percentages were: Stage N1 13.9%, Stage N2 72.2%, Stage N3 0% and Stage R (REM sleep) 13.9%. The sleep architecture was notable for delayed sleep onset once CPAP was initiated- .  RESPIRATORY ANALYSIS:  There were a total of 32 respiratory events: 0 obstructive apneas, 1 central apnea and 0 mixed apneas with a total of 1 apnea and an apnea index (AI) of 0.4. There were 31 hypopneas with a hypopnea index of 13.6 /hour.  The patient also had no respiratory event related arousals (RERAs).      The total APNEA/HYPOPNEA INDEX (AHI) was 14.1 /hour.  12 events occurred in REM sleep and 20 events in NREM. The REM AHI was 37.9 /hour versus a non-REM AHI of 10.2 /hour. The patient spent 100% of total sleep time in the supine position. The supine AHI was 14.0 /hour, versus a non-supine AHI of 0.0/hour.  OXYGEN SATURATION & C02:  The wake baseline 02 saturation was 96%, with the lowest being 77%. Time spent below 89% saturation equaled 65 minutes.  The patient had a total of 0 Periodic Limb Movements. The arousals were noted as: 35 were  spontaneous, 0 were associated with PLMs, 6 were associated with respiratory events.     POLYSOMNOGRAPHY IMPRESSION :   This moderately severe Obstructive Sleep Apnea(OSA) was truly a diagnosis of OBESITY hypoventilation with sever sleep hypoxia   Primary Snoring Fragmented sleep, many spontaneous arousals from sleep No evidence of Periodic Limb Movement Disorder (PLMD). The CPAP titration did not eliminate the REM sleep dependent hypoventilation and associated REM hypoxia.   RECOMMENDATIONS: The patient will use an autotitration CPAP device for at least 30 days before RV in our sleep clinic. The mask will be above named F 30 I medium mask) FFM) and the setting will be 10 to 16 cm water 2 cm EPR with a long RAMP function. I would like to have the patent undergo  an ONO while on CPAP to see if we were able to reduce hypoxia at these settings.  If not he will return for another PAP titration from 8 cm water pressure onward with oxygen titration if needed.    A follow up appointment will be scheduled in the Sleep Clinic at [facility].      I certify that I have reviewed the entire raw data recording prior to the issuance of this report in accordance with the Standards of Accreditation of the American Academy of Sleep Medicine (AASM)   Melvyn Novas, M.D. Medical Director, [facility] [employer] [employer] [facility] (Neurology and Sleep Medicine)

## 2020-12-08 NOTE — Progress Notes (Signed)
POLYSOMNOGRAPHY IMPRESSION:   1. This moderately severe Obstructive Sleep Apnea (OSA) was truly a diagnosis of OBESITY hypoventilation with severe sleep hypoxia   2. Primary Snoring 3. Fragmented sleep, many spontaneous arousals from sleep 4. No evidence of Periodic Limb Movement Disorder (PLMD). 5. The CPAP titration did not eliminate the REM sleep dependent part of hypoventilation and associated REM hypoxia.   RECOMMENDATIONS: The patient will use an autotitration CPAP device for at least 30 days before RV in our sleep clinic. The mask will be above named F 30 I medium mask) FFM) and the setting will be 10 to 16 cm water 2 cm EPR with a long RAMP function. I would like to have the patent undergo an ONO while on CPAP to see if we were able to reduce hypoxia at these settings.  If not he will return for another PAP titration from 8 cm water pressure onward with oxygen titration if needed.    A follow up appointment will be scheduled in the Sleep Clinic at [facility].

## 2020-12-08 NOTE — Progress Notes (Signed)
POLYSOMNOGRAPHY IMPRESSION:   1. This moderately severe Obstructive Sleep Apnea(OSA) was truly a diagnosis of OBESITY hypoventilation with severe sleep hypoxia   2. Primary Snoring 3. Fragmented sleep, many spontaneous arousals from sleep 4. No evidence of Periodic Limb Movement Disorder (PLMD). 5. The CPAP titration did not eliminate the REM sleep dependent hypoventilation and associated REM hypoxia.   RECOMMENDATIONS: The patient will use an autotitration CPAP device for at least 30 days before RV in our sleep clinic. The mask will be above named F 30 I medium mask) FFM) and the setting will be 10 to 16 cm water 2 cm EPR with a long RAMP function. I would like to have the patent undergo an ONO while on CPAP to see if we were able to reduce hypoxia at these settings.  If not he will return for another PAP titration from 8 cm water pressure onward with oxygen titration if needed.    A follow up appointment will be scheduled in the Sleep Clinic at [facility].

## 2020-12-08 NOTE — Addendum Note (Signed)
Addended by: Melvyn Novas on: 12/08/2020 05:28 PM   Modules accepted: Orders

## 2020-12-09 ENCOUNTER — Ambulatory Visit: Payer: Medicare Other

## 2020-12-09 ENCOUNTER — Other Ambulatory Visit: Payer: Self-pay

## 2020-12-09 DIAGNOSIS — M79671 Pain in right foot: Secondary | ICD-10-CM | POA: Diagnosis not present

## 2020-12-09 DIAGNOSIS — R2689 Other abnormalities of gait and mobility: Secondary | ICD-10-CM

## 2020-12-09 DIAGNOSIS — M6281 Muscle weakness (generalized): Secondary | ICD-10-CM

## 2020-12-09 NOTE — Therapy (Signed)
[facility] [address] [place], [place], [address] Phone: [phone]   Fax:  [phone]  Physical Therapy Treatment  Patient Details  Name: Robert Stokes MRN: [id number] Date of Birth: 29-Jun-1990 Referring Provider (PT): Edwin Cap, [place]   Encounter Date: 12/09/2020   PT End of Session - 12/09/20 1538     Visit Number 5    Number of Visits 16    Date for PT Re-Evaluation 01/15/21    Authorization Type UHC MCR - FOTO    PT Start Time 1537    PT Stop Time 1617    PT Time Calculation (min) 40 min    Activity Tolerance Patient limited by pain    Behavior During Therapy [facility] for tasks assessed/performed             Past Medical History:  Diagnosis Date   ADD (attention deficit disorder)    ADHD    Asthma    Back pain    Joint pain    Obesity    Sleep apnea    SOB (shortness of breath)    Swelling of lower extremity     Past Surgical History:  Procedure Laterality Date   ADENOIDECTOMY     TONSILLECTOMY      There were no vitals filed for this visit.   Subjective Assessment - 12/09/20 1641     Subjective Pt presents to PT with reports of sharp decrease in pain after first aquatics visit. He notes that he felt a pop in his foot while in the pool last week, and after a few hours at home his pain subsided. Pt is ready to begin PT at this time.    Currently in Pain? Yes    Pain Score 2     Pain Location Foot    Pain Orientation Right           [facility] Adult PT Treatment/Exercise:   Therapeutic Exercise: Towel crunches x 60 sec R ankle DF/PF/Ev 2x15 yellow tband Manual Therapy: First ray mobilization grade III  STM to R fibularis, R distal LE Cuboid mobilization grade II AP mobs at talocural grade II Cuboid taping R foot                                  PT Long Term Goals - 11/20/20 1616       PT LONG TERM GOAL #1   Title Robert Stokes will be >75% HEP compliant  throughout therapy to improve carryover between sessions and facilitate independent management of condition.    Target Date 01/15/21      PT LONG TERM GOAL #2   Title Robert Stokes will achieve greater than 40 degrees of closed chain ankle DF of the affected ankle (inclinometer placed just distal to tibial tuberosity; "zeroed" vertically; 1/2 kneeling if able; measured on forward leg) to normalize terminal stance phase of gait and reduce stress on posterior ankle structures  EVAL: 26 degrees in standing    Target Date 01/15/21      PT LONG TERM GOAL #3   Title Robert Stokes will report >/= 50% decrease in pain from evaluation  EVAL: 10/10 max pain, 8/10 lowest    Target Date 01/15/21      PT LONG TERM GOAL #4   Title Robert Stokes will be able to stand for 30 min while making a meal, not limited by  pain  EVAL: <5 min    Target Date 01/15/21      PT LONG TERM GOAL #5   Title Robert Stokes will improve FOTO score from 35 (on evaluation) to 58 as a proxy for functional improvement    Target Date 01/15/21                   Plan - 12/09/20 1644     Clinical Impression Statement Pt demonstrated improved tolerance to PT today, but did have slight increase in baseline pain post session. Despite increase in pain, his gait showed marked improvement today overall compared to previous treatment sessions and noted some improved stability with cuboid taping. PT has scheduled additional aquatics visits for pt, as this seemed to improve his pain and mobility greatly. Will continue to assess response to PT and manual interventions and progress as able.    PT Treatment/Interventions Aquatic Therapy;ADLs/Self Care Home Management;Iontophoresis 4mg /ml Dexamethasone;Gait training;Therapeutic activities;Therapeutic exercise;Neuromuscular re-education;Manual techniques;Dry needling;Vasopneumatic Device;Joint Manipulations    PT Next Visit Plan gradual non w/b strengthening of ankle and foot  intrinsics, try ionto?, balance as able, look into weight managment  Aquatic general strenghening and gradual progressive w/b in pool    PT Home Exercise Plan [id number]             Patient will benefit from skilled therapeutic intervention in order to improve the following deficits and impairments:  Abnormal gait, Pain, Decreased range of motion, Decreased strength, Obesity, Difficulty walking  Visit Diagnosis: Pain in right foot  Muscle weakness  Other abnormalities of gait and mobility     Problem List Patient Active Problem List   Diagnosis Date Noted   Super obese 10/24/2020   Sleep related headaches 10/24/2020   Episodic cluster headache, not intractable 10/24/2020   Sleeps in sitting position due to orthopnea 10/24/2020   Insomnia due to medical condition 10/24/2020   Loud snoring 10/24/2020   Excessive daytime sleepiness 10/24/2020   Other fatigue 08/27/2020   Hyperglycemia 08/27/2020   SOB (shortness of breath) on exertion 08/27/2020   Vitamin D deficiency 08/27/2020   Moderate left ankle sprain 01/13/2019    01/15/2019, PT 12/09/2020, 4:47 PM  [facility] [address] [place], [place], [place] Phone: [phone]   Fax:  [phone]  Name: Robert Stokes MRN: Robert Stokes Date of Birth: Oct 03, 1990

## 2020-12-10 ENCOUNTER — Telehealth: Payer: Self-pay | Admitting: Neurology

## 2020-12-10 NOTE — Telephone Encounter (Signed)
Called patient to discuss sleep study results. No answer at this time. LVM for the patient to call back.   

## 2020-12-10 NOTE — Telephone Encounter (Signed)
I called pt. I advised pt that Dr. Vickey Huger reviewed their sleep study results and found that pt has moderate to severe sleep apnea. Dr. Vickey Huger recommends that pt starts auto CPAP. I reviewed PAP compliance expectations with the pt. Pt is agreeable to starting a CPAP. I advised pt that an order will be sent to a DME, [facility], and [facility] will call the pt within about one week after they file with the pt's insurance. [facility] will show the pt how to use the machine, fit for masks, and troubleshoot the CPAP if needed. A follow up appt was made for insurance purposes with Dr. Vickey Huger on Dec 14,2022 at 3:30 pm. Pt verbalized understanding to arrive 15 minutes early and bring their CPAP. A letter with all of this information in it will be mailed to the pt as a reminder. I verified with the pt that the address we have on file is correct. Pt verbalized understanding of results. Pt had no questions at this time but was encouraged to call back if questions arise. I have sent the order to [facility] and have received confirmation that they have received the order.

## 2020-12-10 NOTE — Telephone Encounter (Signed)
-----   Message from Melvyn Novas, MD sent at 12/08/2020  5:26 PM EDT ----- POLYSOMNOGRAPHY IMPRESSION:   1. This moderately severe Obstructive Sleep Apnea (OSA) was truly a diagnosis of OBESITY hypoventilation with severe sleep hypoxia   2. Primary Snoring 3. Fragmented sleep, many spontaneous arousals from sleep 4. No evidence of Periodic Limb Movement Disorder (PLMD). 5. The CPAP titration did not eliminate the REM sleep dependent part of hypoventilation and associated REM hypoxia.   RECOMMENDATIONS: The patient will use an autotitration CPAP device for at least 30 days before RV in our sleep clinic. The mask will be above named F 30 I medium mask) FFM) and the setting will be 10 to 16 cm water 2 cm EPR with a long RAMP function. I would like to have the patent undergo an ONO while on CPAP to see if we were able to reduce hypoxia at these settings.  If not he will return for another PAP titration from 8 cm water pressure onward with oxygen titration if needed.    A follow up appointment will be scheduled in the Sleep Clinic at [facility].

## 2020-12-11 ENCOUNTER — Other Ambulatory Visit: Payer: Self-pay

## 2020-12-11 ENCOUNTER — Ambulatory Visit (HOSPITAL_BASED_OUTPATIENT_CLINIC_OR_DEPARTMENT_OTHER): Payer: Medicare Other | Attending: Podiatry | Admitting: Physical Therapy

## 2020-12-11 ENCOUNTER — Encounter (HOSPITAL_BASED_OUTPATIENT_CLINIC_OR_DEPARTMENT_OTHER): Payer: Self-pay | Admitting: Physical Therapy

## 2020-12-11 DIAGNOSIS — M79671 Pain in right foot: Secondary | ICD-10-CM | POA: Diagnosis not present

## 2020-12-11 DIAGNOSIS — M6281 Muscle weakness (generalized): Secondary | ICD-10-CM | POA: Insufficient documentation

## 2020-12-11 DIAGNOSIS — R2689 Other abnormalities of gait and mobility: Secondary | ICD-10-CM | POA: Diagnosis present

## 2020-12-11 NOTE — Therapy (Signed)
[facility] [address] [place], [place], [address] Phone: [phone]   Fax:  [phone]  Physical Therapy Treatment  Patient Details  Name: Robert Stokes MRN: [id number] Date of Birth: 04-28-1990 Referring Provider (PT): Edwin Cap, [place]   Encounter Date: 12/11/2020   PT End of Session - 12/11/20 1032     Visit Number 6    Number of Visits 16    Date for PT Re-Evaluation 01/15/21    Authorization Type UHC MCR - FOTO    PT Start Time 1030    PT Stop Time 1110    PT Time Calculation (min) 40 min    Activity Tolerance Patient limited by pain    Behavior During Therapy [facility] for tasks assessed/performed             Past Medical History:  Diagnosis Date   ADD (attention deficit disorder)    ADHD    Asthma    Back pain    Joint pain    Obesity    Sleep apnea    SOB (shortness of breath)    Swelling of lower extremity     Past Surgical History:  Procedure Laterality Date   ADENOIDECTOMY     TONSILLECTOMY      There were no vitals filed for this visit.   Subjective Assessment - 12/11/20 1029     Subjective "Whatever she did to my foot made it much better. It's like 10x better, I can actually walk"    Pain Score 2                                              PT Long Term Goals - 11/20/20 1616       PT LONG TERM GOAL #1   Title Robert Stokes will be >75% HEP compliant throughout therapy to improve carryover between sessions and facilitate independent management of condition.    Target Date 01/15/21      PT LONG TERM GOAL #2   Title Robert Stokes will achieve greater than 40 degrees of closed chain ankle DF of the affected ankle (inclinometer placed just distal to tibial tuberosity; "zeroed" vertically; 1/2 kneeling if able; measured on forward leg) to normalize terminal stance phase of gait and reduce stress on posterior ankle structures  EVAL: 26 degrees in  standing    Target Date 01/15/21      PT LONG TERM GOAL #3   Title Robert Stokes will report >/= 50% decrease in pain from evaluation  EVAL: 10/10 max pain, 8/10 lowest    Target Date 01/15/21      PT LONG TERM GOAL #4   Title Robert Stokes will be able to stand for 30 min while making a meal, not limited by pain  EVAL: <5 min    Target Date 01/15/21      PT LONG TERM GOAL #5   Title Robert Stokes will improve FOTO score from 35 (on evaluation) to 58 as a proxy for functional improvement    Target Date 01/15/21              Pt seen for aquatic therapy today.  Treatment took place in water 3.5-4.0 ft in depth at the [facility] pool. Temp of water was 94.  Pt entered/exited the pool via stairs independently with bilat  rail. -Reviewed response to prior Rx, and pain level.     Pt ambulated with noodle 3-4 laps and sidestepped with noodle 3-4 laps with verbal and visual instruction for improved heel strike and heel to toe gait pattern.  Pt tends to ambulate with step to gait even in water and PT tried to encourage more normalized gait.  50 % wt bearing with water at waist deep water Moved to deep end with 205 wt bearing to begin step over step technique ambulation with decreased loading of R foot jt   Sitting.  PT with manual/ aqua stretch of foot. Mobilization of 3 rd - 5th ray and Great toe flex/ ext.  Pt also sitting on edge of pool sitting at 90 90 hip flex. Knee flex.  PT AP talocrural glide as pt bears wt through ankle and forward translation of knee.   Cuboid mobilization.  Pt does have increase in pain with mobilization. -ankle stretching and foot mobility rolling over blue noodle seated.  Progressed to standing/weight bearing.   Standing -Standing on noodle gastroc and anterior tib stretching supported holding to wall. -Tandem on noodle with VC and demonstration for ankle inversion/eversion supported holding to wall -Gastroc and plantar fascia stretching on step    -Runners stretch  2 x  30 sec Left and then moves into hamstring stretch. Then runners stretch on R 2 x 30 sec and then move into hamstring stretch . Demonstration and vc to insure proper technique.   Gait training using noodle for ue support; VC for heel strike and toe off forward amb, than backward amb toe strike x 6-8 widths          -Standing calf stretch at pool edge 3 x 20 seconds bilat standing on edge of pool     -balance challenge rotating df/pf over noodle holding to edge of pool     Pt in water 2/10 but increased to 4/ 10 at end of session   Pt requires buoyancy for support and to offload joints with strengthening exercises. Viscosity of the water is needed for resistance of strengthening; water current perturbations provides challenge to standing balance unsupported, requiring increased core activation         Plan - 12/11/20 1226     Clinical Impression Statement Pain upon initiaion of session low 2/10 but does increase to 4/10 upon completion. He continues with step to gait pattern in and out of water but is able to correct/improve with cues. Balance good submerged.    PT Treatment/Interventions Aquatic Therapy;ADLs/Self Care Home Management;Iontophoresis 4mg /ml Dexamethasone;Gait training;Therapeutic activities;Therapeutic exercise;Neuromuscular re-education;Manual techniques;Dry needling;Vasopneumatic Device;Joint Manipulations             Patient will benefit from skilled therapeutic intervention in order to improve the following deficits and impairments:  Abnormal gait, Pain, Decreased range of motion, Decreased strength, Obesity, Difficulty walking  Visit Diagnosis: Pain in right foot  Muscle weakness  Other abnormalities of gait and mobility     Problem List Patient Active Problem List   Diagnosis Date Noted   Super obese 10/24/2020   Sleep related headaches 10/24/2020   Episodic cluster headache, not intractable 10/24/2020   Sleeps in sitting  position due to orthopnea 10/24/2020   Insomnia due to medical condition 10/24/2020   Loud snoring 10/24/2020   Excessive daytime sleepiness 10/24/2020   Other fatigue 08/27/2020   Hyperglycemia 08/27/2020   SOB (shortness of breath) on exertion 08/27/2020   Vitamin D deficiency 08/27/2020   Moderate left ankle sprain  01/13/2019    Rushie Chestnut) Analena Gama MPT  12/11/2020, 12:43 PM  [facility] [address] [place], [place], [address] Phone: [phone]   Fax:  [phone]  Name: Robert Stokes MRN: [id number] Date of Birth: [birth year]

## 2020-12-13 ENCOUNTER — Ambulatory Visit: Payer: Medicare Other | Admitting: Podiatry

## 2020-12-17 ENCOUNTER — Ambulatory Visit (INDEPENDENT_AMBULATORY_CARE_PROVIDER_SITE_OTHER): Payer: Medicare Other | Admitting: Podiatry

## 2020-12-17 ENCOUNTER — Other Ambulatory Visit: Payer: Self-pay

## 2020-12-17 DIAGNOSIS — M7750 Other enthesopathy of unspecified foot: Secondary | ICD-10-CM

## 2020-12-17 DIAGNOSIS — M7751 Other enthesopathy of right foot: Secondary | ICD-10-CM

## 2020-12-17 MED ORDER — BETAMETHASONE SOD PHOS & ACET 6 (3-3) MG/ML IJ SUSP
3.0000 mg | Freq: Once | INTRAMUSCULAR | Status: AC
  Administered 2020-12-17: 3 mg

## 2020-12-17 NOTE — Progress Notes (Signed)
  Subjective:  Patient ID: Robert Stokes, male    DOB: 13-Oct-1990,  MRN: [id number]  Chief Complaint  Patient presents with   Foot Pain    30 y.o. male presents with the above complaint.  Pain unchanged. Steroid pack didn't help. States aquatic therapy had helped but once he put weight back on it felt the progress was undone. Objective:  Physical Exam: warm, good capillary refill, no trophic changes or ulcerative lesions, normal DP and PT pulses and normal sensory exam. Right Foot: POP  4th/5th metatarsal area with maximal pain at the fifth metatarsal base  Assessment:   1. Tendonitis of ankle or foot    Plan:  Patient was evaluated and treated and all questions answered.  Tendonitis right foot -Continued pain; he has failed NSAIDs cast immobilization boot immobilization, oral steroids, PT.  At this point trial an injection to see if this will help with his pain.  Injection was delivered to the fifth metatarsal base area peroneus brevis insertion as this appears to be the area of maximal pain.  Procedure: tendon injection Location: fifth metatarsal peroneus brevis insertion Skin Prep: Alcohol. Injectate: 0.5 cc 0.5 cc marcaine plain, 0.5 betamethasone acetate-betamethasone sodium phosphate Disposition: Patient tolerated procedure well. Injection site dressed with a band-aid.   Return in about 4 weeks (around 01/14/2021).

## 2020-12-17 NOTE — Patient Instructions (Signed)
Voltaren gel - available over the counter can help with pain and inflammation.

## 2020-12-18 ENCOUNTER — Encounter (HOSPITAL_BASED_OUTPATIENT_CLINIC_OR_DEPARTMENT_OTHER): Payer: Self-pay | Admitting: Physical Therapy

## 2020-12-18 ENCOUNTER — Ambulatory Visit (HOSPITAL_BASED_OUTPATIENT_CLINIC_OR_DEPARTMENT_OTHER): Payer: Medicare Other | Admitting: Physical Therapy

## 2020-12-18 DIAGNOSIS — R2689 Other abnormalities of gait and mobility: Secondary | ICD-10-CM

## 2020-12-18 DIAGNOSIS — M6281 Muscle weakness (generalized): Secondary | ICD-10-CM

## 2020-12-18 DIAGNOSIS — M79671 Pain in right foot: Secondary | ICD-10-CM

## 2020-12-18 NOTE — Therapy (Signed)
[facility] [address] [place], [place], [address] Phone: [phone]   Fax:  [phone]  Physical Therapy Treatment  Patient Details  Name: Robert Stokes MRN: [id number] Date of Birth: April 13, 1990 Referring Provider (PT): Edwin Cap, [place]   Encounter Date: 12/18/2020   PT End of Session - 12/18/20 1643     Visit Number 7    Number of Visits 16    Date for PT Re-Evaluation 01/15/21    Authorization Type UHC MCR - FOTO    PT Start Time 1632    PT Stop Time 1710    PT Time Calculation (min) 38 min    Activity Tolerance Patient limited by pain    Behavior During Therapy [facility] for tasks assessed/performed             Past Medical History:  Diagnosis Date   ADD (attention deficit disorder)    ADHD    Asthma    Back pain    Joint pain    Obesity    Sleep apnea    SOB (shortness of breath)    Swelling of lower extremity     Past Surgical History:  Procedure Laterality Date   ADENOIDECTOMY     TONSILLECTOMY      There were no vitals filed for this visit.   Subjective Assessment - 12/18/20 1657     Subjective "Podiatrist gave another cortizone shot yesterday right into my cuboid area.  No pain since then. I hurt really bad after the last session"                                        PT Education - 12/18/20 1825     Education Details podiatrist vs nutritionist    Person(s) Educated Patient    Methods Explanation    Comprehension Verbalized understanding                 PT Long Term Goals - 11/20/20 1616       PT LONG TERM GOAL #1   Title Robert Stokes will be >75% HEP compliant throughout therapy to improve carryover between sessions and facilitate independent management of condition.    Target Date 01/15/21      PT LONG TERM GOAL #2   Title Robert Stokes will achieve greater than 40 degrees of closed chain ankle DF of the affected ankle (inclinometer  placed just distal to tibial tuberosity; "zeroed" vertically; 1/2 kneeling if able; measured on forward leg) to normalize terminal stance phase of gait and reduce stress on posterior ankle structures  EVAL: 26 degrees in standing    Target Date 01/15/21      PT LONG TERM GOAL #3   Title Robert Stokes will report >/= 50% decrease in pain from evaluation  EVAL: 10/10 max pain, 8/10 lowest    Target Date 01/15/21      PT LONG TERM GOAL #4   Title Robert Stokes will be able to stand for 30 min while making a meal, not limited by pain  EVAL: <5 min    Target Date 01/15/21      PT LONG TERM GOAL #5   Title Robert Stokes will improve FOTO score from 35 (on evaluation) to 58 as a proxy for functional improvement    Target Date 01/15/21  Pt seen for aquatic therapy today.  Treatment took place in water 3.5-4.0 ft in depth at the [facility] pool. Temp of water was 94.  Pt entered/exited the pool via stairs independently with bilat rail. -Reviewed response to prior Rx, and pain level.     Pt ambulated without ue support  3-4 laps and sidestepped with noodle 3-4 laps with VC and demonstrtion for heel strike and toe off    Sitting.  PT with manual/ aqua stretch of foot. Mobilization of 3 rd - 5th ray and Great toe flex/ ext,  AP talocrural glide  Cuboid mobilization.    Standing -IT band stretch with right foot supination and inversion in adduction then pronation and eversion with abd. Manual assist for foot positioning and demonstration for proper execution -standing pf x 20 df x 10 with increase of pain in cuboid area. -Gastroc and plantar fascia stretching on step   -Runners stretch  2 x  30 sec Left and then moves into hamstring stretch. Then runners stretch on R 2 x 30 sec and then move into hamstring stretch .   Gait training using noodle for ue support; VC for heel strike and toe off forward amb, than backward amb toe strike x 6-8 widths   Pt in and out  of water 0/10 today   Pt requires buoyancy for support and to offload joints with strengthening exercises. Viscosity of the water is needed for resistance of strengthening; water current perturbations provides challenge to standing balance unsupported, requiring increased core activation          Plan - 12/18/20 1658     Clinical Impression Statement Pt has had complete pain relief from cortizone shot yesterday other than a "bee stng" kind of sensation where the injection entered. Pt has gained full supination and inversion of right foot with manual stretching and joint mobilization today.  Some discomfort with AP talocrural mobilization.  DF and pf completed 70% submerged with increased discomfrt after reps of df.  He is able to tolerate entire session with some discomfort although upon completion no complaints. Pt able to climb steps entering and exiting pool alternating indep without discomfort.    PT Treatment/Interventions Aquatic Therapy;ADLs/Self Care Home Management;Iontophoresis 4mg /ml Dexamethasone;Gait training;Therapeutic activities;Therapeutic exercise;Neuromuscular re-education;Manual techniques;Dry needling;Vasopneumatic Device;Joint Manipulations    PT Next Visit Plan gradual non w/b strengthening of ankle and foot intrinsics, try ionto?, balance as able, look into weight managment  Aquatic general strenghening and gradual progressive w/b in pool    PT Home Exercise Plan [id number]             Patient will benefit from skilled therapeutic intervention in order to improve the following deficits and impairments:  Abnormal gait, Pain, Decreased range of motion, Decreased strength, Obesity, Difficulty walking  Visit Diagnosis: Pain in right foot  Muscle weakness  Other abnormalities of gait and mobility     Problem List Patient Active Problem List   Diagnosis Date Noted   Super obese 10/24/2020   Sleep related headaches 10/24/2020   Episodic cluster headache, not  intractable 10/24/2020   Sleeps in sitting position due to orthopnea 10/24/2020   Insomnia due to medical condition 10/24/2020   Loud snoring 10/24/2020   Excessive daytime sleepiness 10/24/2020   Other fatigue 08/27/2020   Hyperglycemia 08/27/2020   SOB (shortness of breath) on exertion 08/27/2020   Vitamin D deficiency 08/27/2020   Moderate left ankle sprain 01/13/2019    01/15/2019) Chesley Veasey MPT  12/18/2020, 6:30 PM  [facility] [address] [place], [place], [address] Phone: [phone]   Fax:  [phone]  Name: Robert Stokes MRN: [id number] Date of Birth: [birth year]

## 2020-12-20 ENCOUNTER — Other Ambulatory Visit: Payer: Self-pay

## 2020-12-20 ENCOUNTER — Ambulatory Visit (HOSPITAL_BASED_OUTPATIENT_CLINIC_OR_DEPARTMENT_OTHER): Payer: Medicare Other | Admitting: Physical Therapy

## 2020-12-20 DIAGNOSIS — R2689 Other abnormalities of gait and mobility: Secondary | ICD-10-CM

## 2020-12-20 DIAGNOSIS — M6281 Muscle weakness (generalized): Secondary | ICD-10-CM

## 2020-12-20 DIAGNOSIS — M79671 Pain in right foot: Secondary | ICD-10-CM | POA: Diagnosis not present

## 2020-12-20 NOTE — Therapy (Signed)
[facility] [address] [place], [place], [address] Phone: [phone]   Fax:  [phone]  Physical Therapy Treatment  Patient Details  Name: Robert Stokes MRN: [id number] Date of Birth: 01-27-1991 Referring Provider (PT): Edwin Cap, [place]   Encounter Date: 12/20/2020   PT End of Session - 12/20/20 1124     Visit Number 8    Number of Visits 16    Date for PT Re-Evaluation 01/15/21    Authorization Type UHC MCR - FOTO    PT Start Time 1118    PT Stop Time 1200    PT Time Calculation (min) 42 min    Activity Tolerance Patient limited by pain    Behavior During Therapy [facility] for tasks assessed/performed             Past Medical History:  Diagnosis Date   ADD (attention deficit disorder)    ADHD    Asthma    Back pain    Joint pain    Obesity    Sleep apnea    SOB (shortness of breath)    Swelling of lower extremity     Past Surgical History:  Procedure Laterality Date   ADENOIDECTOMY     TONSILLECTOMY      There were no vitals filed for this visit.   Subjective Assessment - 12/20/20 1220     Subjective Have been hurting bad since last session, right back to where it was."    Pain Score 4     Pain Location Foot    Pain Orientation Right    Pain Descriptors / Indicators Aching;Sharp    Pain Onset More than a month ago    Pain Frequency Constant                                             PT Long Term Goals - 11/20/20 1616       PT LONG TERM GOAL #1   Title [name] will be >75% HEP compliant throughout therapy to improve carryover between sessions and facilitate independent management of condition.    Target Date 01/15/21      PT LONG TERM GOAL #2   Title [name] will achieve greater than 40 degrees of closed chain ankle DF of the affected ankle (inclinometer placed just distal to tibial tuberosity; "zeroed" vertically; 1/2 kneeling if able;  measured on forward leg) to normalize terminal stance phase of gait and reduce stress on posterior ankle structures  EVAL: 26 degrees in standing    Target Date 01/15/21      PT LONG TERM GOAL #3   Title [name] will report >/= 50% decrease in pain from evaluation  EVAL: 10/10 max pain, 8/10 lowest    Target Date 01/15/21      PT LONG TERM GOAL #4   Title DARELL SAPUTO will be able to stand for 30 min while making a meal, not limited by pain  EVAL: <5 min    Target Date 01/15/21      PT LONG TERM GOAL #5   Title KINNEY SACKMANN will improve FOTO score from 35 (on evaluation) to 58 as a proxy for functional improvement    Target Date 01/15/21             Pt seen for aquatic therapy today.  Treatment took place in water 3.5-4.0 ft in depth at the [facility] pool. Temp of water was 94.  Pt entered/exited the pool via stairs independently with bilat rail. -Reviewed response to prior Rx, and pain level.     Pt ambulated without ue support  3-4 lengths. Pt with difficulty with heel-toe due to right foot pain     Sitting.  PT with manual/ aqua stretch of foot. Mobilization of 3 rd - 5th ray and Great toe flex/ ext,  AP talocrural glide  Cuboid mobilization.     Standing -IT band stretch with right foot supination and inversion in adduction then pronation and eversion with abd. Manual assist for foot positioning and demonstration for proper execution -Gastroc and plantar fascia stretching on step   -Runners stretch  2 x  30 sec Left and then moves into hamstring stretch. Then runners stretch on R 2 x 30 sec and then move into hamstring stretch .   Pain initially 3/10 upon completion 4/10.   Pt requires buoyancy for support and to offload joints with strengthening exercises. Viscosity of the water is needed for resistance of strengthening; water current perturbations provides challenge to standing balance unsupported, requiring increased core activation            Plan - 12/20/20 1141     Clinical Impression Statement pain has returned to pre injection level after aquatic session last visit. Pt is not tolerating aquatic intervention well.  All exercises and activitys increase his pain at right cuboid area, flaring him for the day.  Discussion with pt and he is in agreement.  He reports wanting to get second MD opinion.  I assisted him with [place] [facility].  He may go to [place]. He has cancelled the remainder of his aquatic session. Will return to on land therapist at next scheduled appointment on Oct 4.    Stability/Clinical Decision Making Stable/Uncomplicated    PT Treatment/Interventions Aquatic Therapy;ADLs/Self Care Home Management;Iontophoresis 4mg /ml Dexamethasone;Gait training;Therapeutic activities;Therapeutic exercise;Neuromuscular re-education;Manual techniques;Dry needling;Vasopneumatic Device;Joint Manipulations    PT Next Visit Plan return to on land therapist    PT Home Exercise Plan [id number]             Patient will benefit from skilled therapeutic intervention in order to improve the following deficits and impairments:  Abnormal gait, Pain, Decreased range of motion, Decreased strength, Obesity, Difficulty walking  Visit Diagnosis: Pain in right foot  Muscle weakness  Other abnormalities of gait and mobility     Problem List Patient Active Problem List   Diagnosis Date Noted   Super obese 10/24/2020   Sleep related headaches 10/24/2020   Episodic cluster headache, not intractable 10/24/2020   Sleeps in sitting position due to orthopnea 10/24/2020   Insomnia due to medical condition 10/24/2020   Loud snoring 10/24/2020   Excessive daytime sleepiness 10/24/2020   Other fatigue 08/27/2020   Hyperglycemia 08/27/2020   SOB (shortness of breath) on exertion 08/27/2020   Vitamin D deficiency 08/27/2020   Moderate left ankle sprain 01/13/2019    01/15/2019) Khiem Gargis MPT  12/20/2020, 12:24 PM  [facility] [address] [place], [place], [place] Phone: [phone]   Fax:  [phone]  Name: DRAXTON LUU MRN: [name] Date of Birth: 1990/07/14

## 2020-12-20 NOTE — Therapy (Signed)
[facility] [address] [place], [place], [address] Phone: [phone]   Fax:  [phone]  Physical Therapy Treatment  Patient Details  Name: Robert Stokes MRN: [id number] Date of Birth: 04-08-1990 Referring Provider (PT): Edwin Cap, [place]   Encounter Date: 12/20/2020   PT End of Session - 12/20/20 1124     Visit Number 8    Number of Visits 16    Date for PT Re-Evaluation 01/15/21    Authorization Type UHC MCR - FOTO    PT Start Time 1118    Activity Tolerance Patient limited by pain    Behavior During Therapy [facility] for tasks assessed/performed             Past Medical History:  Diagnosis Date   ADD (attention deficit disorder)    ADHD    Asthma    Back pain    Joint pain    Obesity    Sleep apnea    SOB (shortness of breath)    Swelling of lower extremity     Past Surgical History:  Procedure Laterality Date   ADENOIDECTOMY     TONSILLECTOMY      There were no vitals filed for this visit.                                    PT Long Term Goals - 11/20/20 1616       PT LONG TERM GOAL #1   Title [name] will be >75% HEP compliant throughout therapy to improve carryover between sessions and facilitate independent management of condition.    Target Date 01/15/21      PT LONG TERM GOAL #2   Title [name] will achieve greater than 40 degrees of closed chain ankle DF of the affected ankle (inclinometer placed just distal to tibial tuberosity; "zeroed" vertically; 1/2 kneeling if able; measured on forward leg) to normalize terminal stance phase of gait and reduce stress on posterior ankle structures  EVAL: 26 degrees in standing    Target Date 01/15/21      PT LONG TERM GOAL #3   Title [name] will report >/= 50% decrease in pain from evaluation  EVAL: 10/10 max pain, 8/10 lowest    Target Date 01/15/21      PT LONG TERM GOAL #4   Title Robert Stokes will be able to stand for 30 min while making a meal, not limited by pain  EVAL: <5 min    Target Date 01/15/21      PT LONG TERM GOAL #5   Title Robert Stokes will improve FOTO score from 35 (on evaluation) to 58 as a proxy for functional improvement    Target Date 01/15/21               Pt seen for aquatic therapy today.  Treatment took place in water 3.5-4.0 ft in depth at the [facility] pool. Temp of water was 94.  Pt entered/exited the pool via stairs independently with bilat rail. -Reviewed response to prior Rx, and pain level.     Pt ambulated without ue support  3-4 laps and sidestepped with noodle 3-4 laps with VC and demonstrtion for heel strike and toe off     Sitting.  PT with manual/ aqua stretch of foot. Mobilization of 3 rd - 5th ray and Great toe flex/  ext,  AP talocrural glide  Cuboid mobilization.     Standing -IT band stretch with right foot supination and inversion in adduction then pronation and eversion with abd. Manual assist for foot positioning and demonstration for proper execution -standing pf x 20 df x 10 with increase of pain in cuboid area. -Gastroc and plantar fascia stretching on step   -Runners stretch  2 x  30 sec Left and then moves into hamstring stretch. Then runners stretch on R 2 x 30 sec and then move into hamstring stretch .   Gait training using noodle for ue support; VC for heel strike and toe off forward amb, than backward amb toe strike x 6-8 widths     Pt in and out of water 0/10 today   Pt requires buoyancy for support and to offload joints with strengthening exercises. Viscosity of the water is needed for resistance of strengthening; water current perturbations provides challenge to standing balance unsupported, requiring increased core activation           Patient will benefit from skilled therapeutic intervention in order to improve the following deficits and impairments:     Visit Diagnosis: No  diagnosis found.     Problem List Patient Active Problem List   Diagnosis Date Noted   Super obese 10/24/2020   Sleep related headaches 10/24/2020   Episodic cluster headache, not intractable 10/24/2020   Sleeps in sitting position due to orthopnea 10/24/2020   Insomnia due to medical condition 10/24/2020   Loud snoring 10/24/2020   Excessive daytime sleepiness 10/24/2020   Other fatigue 08/27/2020   Hyperglycemia 08/27/2020   SOB (shortness of breath) on exertion 08/27/2020   Vitamin D deficiency 08/27/2020   Moderate left ankle sprain 01/13/2019    Jeanmarie Hubert, PT 12/20/2020, 11:26 AM  [facility] [address] [place], [place], [address] Phone: [phone]   Fax:  [phone]  Name: TAMAJ JURGENS MRN: [id number] Date of Birth: 1990/06/16

## 2020-12-24 ENCOUNTER — Other Ambulatory Visit: Payer: Self-pay

## 2020-12-24 ENCOUNTER — Ambulatory Visit (INDEPENDENT_AMBULATORY_CARE_PROVIDER_SITE_OTHER): Payer: Medicare Other | Admitting: Family Medicine

## 2020-12-24 VITALS — BP 132/74 | HR 78 | Temp 98.0°F | Ht 71.0 in | Wt >= 6400 oz

## 2020-12-24 DIAGNOSIS — Z6841 Body Mass Index (BMI) 40.0 and over, adult: Secondary | ICD-10-CM | POA: Diagnosis not present

## 2020-12-24 DIAGNOSIS — M79671 Pain in right foot: Secondary | ICD-10-CM | POA: Diagnosis not present

## 2020-12-24 DIAGNOSIS — E8881 Metabolic syndrome: Secondary | ICD-10-CM | POA: Diagnosis not present

## 2020-12-25 ENCOUNTER — Ambulatory Visit (HOSPITAL_BASED_OUTPATIENT_CLINIC_OR_DEPARTMENT_OTHER): Payer: Medicare Other | Admitting: Physical Therapy

## 2020-12-25 MED ORDER — METFORMIN HCL 500 MG PO TABS: 500.0000 mg | ORAL_TABLET | Freq: Two times a day (BID) | ORAL | 0 refills | Status: DC

## 2020-12-25 NOTE — Progress Notes (Signed)
Chief Complaint:   OBESITY Robert Stokes is here to discuss his progress with his obesity treatment plan along with follow-up of his obesity related diagnoses. Robert Stokes is on keeping a food journal and adhering to recommended goals of 1800-2300 calories and 120+ grams of protein daily and states he is following his eating plan approximately 50% of the time. Robert Stokes states he is doing 0 minutes 0 times per week.  Today's visit was #: 7 Starting weight: 412 lbs Starting date: 08/21/2020 Today's weight: 411 lbs Today's date: 12/24/2020 Total lbs lost to date: 1 Total lbs lost since last in-office visit: 0  Interim History: Robert Stokes has been unable to follow his plan. His finances are in dire shape due to a foot injury, which makes working difficult. He cannot afford food and he is feeling defeated.  Subjective:   1. Insulin resistance Robert Stokes is stable on metformin, and he is working on his diet but he is very limited in what he can afford to eat.  2. Right foot pain Robert Stokes has right foot pain, which worsens with prolonged standing. He was seen by [facility], but with no resolution of his pain. He is unable to stand and this limits jobs that he is qualified for.  Assessment/Plan:   1. Insulin resistance Robert Stokes will continue to work on weight loss, exercise, and decreasing simple carbohydrates to help decrease the risk of diabetes. We will refill metformin 500 mg BID #60 for 1 month. Robert Stokes agreed to follow-up with [name] as directed to closely monitor his progress.  2. Right foot pain We will refer Robert Stokes to [facility] for a second opinion and evaluation.  3. Obesity with current BMI 57.3 Robert Stokes is currently in the action stage of change. As such, his goal is to continue with weight loss efforts. He has agreed to practicing portion control and making smarter food choices, such as increasing vegetables and decreasing simple carbohydrates.   Robert Stokes was given a handout with food pantry  resources to help him be able to eat. He will concentrate on this for now and not worry about weight loss until he is in a more stable situation.  Behavioral modification strategies: increasing lean protein intake.  Robert Stokes has agreed to follow-up with our clinic in 4 weeks. He was informed of the importance of frequent follow-up visits to maximize his success with intensive lifestyle modifications for his multiple health conditions.   Objective:   Blood pressure 132/74, pulse 78, temperature 98 F (36.7 C), height 5\' 11"  (1.803 m), weight (!) 411 lb (186.4 kg), SpO2 98 %. Body mass index is 57.32 kg/m.  General: Cooperative, alert, well developed, in no acute distress. HEENT: Conjunctivae and lids unremarkable. Cardiovascular: Regular rhythm.  Lungs: Normal work of breathing. Neurologic: No focal deficits.   Lab Results  Component Value Date   CREATININE 0.76 09/01/2020   BUN 15 09/01/2020   NA 139 09/01/2020   K 3.8 09/01/2020   CL 102 09/01/2020   CO2 27 09/01/2020   Lab Results  Component Value Date   ALT 29 08/21/2020   AST 18 08/21/2020   ALKPHOS 109 08/21/2020   BILITOT 0.4 08/21/2020   Lab Results  Component Value Date   HGBA1C 5.0 08/21/2020   Lab Results  Component Value Date   INSULIN 23.4 08/21/2020   Lab Results  Component Value Date   TSH 2.870 08/21/2020   Lab Results  Component Value Date   CHOL 184 08/21/2020   HDL  39 (L) 08/21/2020   LDLCALC 126 (H) 08/21/2020   TRIG 104 08/21/2020   Lab Results  Component Value Date   VD25OH 12.2 (L) 08/21/2020   Lab Results  Component Value Date   WBC 9.2 09/01/2020   HGB 14.3 09/01/2020   HCT 45.7 09/01/2020   MCV 88.2 09/01/2020   PLT 246 09/01/2020   No results found for: IRON, TIBC, FERRITIN  Obesity Behavioral Intervention:   Approximately 15 minutes were spent on the discussion below.  ASK: We discussed the diagnosis of obesity with Robert Stokes today and Robert Stokes agreed to give [name] permission  to discuss obesity behavioral modification therapy today.  ASSESS: Robert Stokes has the diagnosis of obesity and his BMI today is 42.3. Robert Stokes is in the action stage of change.   ADVISE: Robert Stokes was educated on the multiple health risks of obesity as well as the benefit of weight loss to improve his health. He was advised of the need for Robert Stokes term treatment and the importance of lifestyle modifications to improve his current health and to decrease his risk of future health problems.  AGREE: Multiple dietary modification options and treatment options were discussed and Robert Stokes agreed to follow the recommendations documented in the above note.  ARRANGE: Robert Stokes was educated on the importance of frequent visits to treat obesity as outlined per CMS and USPSTF guidelines and agreed to schedule his next follow up appointment today.  Attestation Statements:   Reviewed by clinician on day of visit: allergies, medications, problem list, medical history, surgical history, family history, social history, and previous encounter notes.   I, Burt Knack, am acting as transcriptionist for Quillian Quince, MD.  I have reviewed the above documentation for accuracy and completeness, and I agree with the above. -  Quillian Quince, MD

## 2020-12-27 ENCOUNTER — Ambulatory Visit (HOSPITAL_BASED_OUTPATIENT_CLINIC_OR_DEPARTMENT_OTHER): Payer: Medicare Other | Admitting: Physical Therapy

## 2020-12-31 ENCOUNTER — Ambulatory Visit: Payer: Medicare Other | Attending: Podiatry

## 2020-12-31 ENCOUNTER — Other Ambulatory Visit: Payer: Self-pay

## 2020-12-31 ENCOUNTER — Ambulatory Visit (HOSPITAL_BASED_OUTPATIENT_CLINIC_OR_DEPARTMENT_OTHER): Payer: Medicare Other | Admitting: Physical Therapy

## 2020-12-31 DIAGNOSIS — M79671 Pain in right foot: Secondary | ICD-10-CM | POA: Insufficient documentation

## 2020-12-31 DIAGNOSIS — M6281 Muscle weakness (generalized): Secondary | ICD-10-CM | POA: Insufficient documentation

## 2020-12-31 DIAGNOSIS — R2689 Other abnormalities of gait and mobility: Secondary | ICD-10-CM | POA: Diagnosis present

## 2020-12-31 NOTE — Therapy (Signed)
[place], [place], [address] Phone: [phone]   Fax:  [phone]  Physical Therapy Treatment  Patient Details  Name: Robert Stokes MRN: [id number] Date of Birth: Dec 24, 1990 Referring Provider (PT): Robert Stokes, [place]   Encounter Date: 12/31/2020   PT End of Session - 12/31/20 1535     Visit Number 9    Number of Visits 16    Date for PT Re-Evaluation 01/15/21    Authorization Type UHC MCR - FOTO    PT Start Time 6010    PT Stop Time 1605    PT Time Calculation (min) 30 min    Activity Tolerance Patient limited by pain    Behavior During Therapy [facility] for tasks assessed/performed             Past Medical History:  Diagnosis Date   ADD (attention deficit disorder)    ADHD    Asthma    Back pain    Joint pain    Obesity    Sleep apnea    SOB (shortness of breath)    Swelling of lower extremity     Past Surgical History:  Procedure Laterality Date   ADENOIDECTOMY     TONSILLECTOMY      There were no vitals filed for this visit.   Subjective Assessment - 12/31/20 1536     Subjective Pt presents to PT with reports of continued reports of R foot pain and discomfort. He has new referral to orthopedics for second opinion of R foot pain.    Currently in Pain? Yes    Pain Score 5     Pain Location Foot    Pain Orientation Right    Pain Descriptors / Indicators Aching;Robert Stokes Adult PT Treatment/Exercise:   Self Care: Education on self mobilization of R cuboid and review of HEP Manual Therapy: Rigid taping to R cuboid      [facility] PT Assessment - 12/31/20 0001       Observation/Other Assessments   Focus on Therapeutic Outcomes (FOTO)  41% function                                    PT Education - 12/31/20 1608     Education Details HEP and discharge plan    Person(s) Educated Patient    Methods Explanation;Demonstration;Handout     Comprehension Verbalized understanding;Returned demonstration                 PT Long Term Goals - 12/31/20 1540       PT LONG TERM GOAL #1   Title Robert Stokes will be >75% HEP compliant throughout therapy to improve carryover between sessions and facilitate independent management of condition.    Status Achieved      PT LONG TERM GOAL #2   Title Robert Stokes will achieve greater than 40 degrees of closed chain ankle DF of the affected ankle (inclinometer placed just distal to tibial tuberosity; "zeroed" vertically; 1/2 kneeling if able; measured on forward leg) to normalize terminal stance phase of gait and reduce stress on posterior ankle structures  EVAL: 26 degrees in standing    Status Not Met      PT LONG TERM GOAL #3   Title Robert Stokes will report >/= 50% decrease in pain from evaluation  EVAL: 10/10 max pain, 8/10 lowest    Baseline 12/31/20 - continued 10/10 pain at worst    Status Not Met      PT LONG TERM GOAL #4   Title Robert Stokes will be able to stand for 30 min while making a meal, not limited by pain  EVAL: <5 min    Status Not Met      PT LONG TERM GOAL #5   Title Robert Stokes will improve FOTO score from 35 (on evaluation) to 58 as a proxy for functional improvement    Baseline increased to 41% on 12/31/20    Status Partially Met                   Plan - 12/31/20 1755     Clinical Impression Statement Pt was able to complete prescribed exercises with no adverse effect and demonstrated knowledge of HEP. Unfortunately, pt has not progressed well with PT treatment and continues to note severe R foot pain and discomfort. While his FOTO score has improved, he has not had any other change or advancement in LTGs and was discharged from aquatic therapy d/t no change in status. PT today taped his cuboid and provided education on self mobilization of cuboid. Updated HEP and provided appropriate discharge information.    PT  Treatment/Interventions Aquatic Therapy;ADLs/Self Care Home Management;Iontophoresis 25m/ml Dexamethasone;Gait training;Therapeutic activities;Therapeutic exercise;Neuromuscular re-education;Manual techniques;Dry needling;Vasopneumatic Device;Joint Manipulations    PT Home Exercise Plan [id number]    Consulted and Agree with Plan of Care Patient             Patient will benefit from skilled therapeutic intervention in order to improve the following deficits and impairments:  Abnormal gait, Pain, Decreased range of motion, Decreased strength, Obesity, Difficulty walking  Visit Diagnosis: Pain in right foot  Muscle weakness  Other abnormalities of gait and mobility     Problem List Patient Active Problem List   Diagnosis Date Noted   Super obese 10/24/2020   Sleep related headaches 10/24/2020   Episodic cluster headache, not intractable 10/24/2020   Sleeps in sitting position due to orthopnea 10/24/2020   Insomnia due to medical condition 10/24/2020   Loud snoring 10/24/2020   Excessive daytime sleepiness 10/24/2020   Other fatigue 08/27/2020   Hyperglycemia 08/27/2020   SOB (shortness of breath) on exertion 08/27/2020   Vitamin D deficiency 08/27/2020   Moderate left ankle sprain 01/13/2019    DWard Chatters PT 12/31/2020, 5:59 PM  [facility] [address]Phone: [phone]  Fax:  [phone] Name: Robert SAFLEYMRN: [id number] of Birth: [birth year]

## 2021-01-03 ENCOUNTER — Ambulatory Visit (HOSPITAL_BASED_OUTPATIENT_CLINIC_OR_DEPARTMENT_OTHER): Payer: Medicare Other | Admitting: Physical Therapy

## 2021-01-07 DIAGNOSIS — S93601A Unspecified sprain of right foot, initial encounter: Secondary | ICD-10-CM | POA: Insufficient documentation

## 2021-01-08 ENCOUNTER — Other Ambulatory Visit: Payer: Self-pay | Admitting: Orthopaedic Surgery

## 2021-01-09 ENCOUNTER — Other Ambulatory Visit: Payer: Self-pay | Admitting: Orthopaedic Surgery

## 2021-01-10 ENCOUNTER — Other Ambulatory Visit: Payer: Self-pay | Admitting: Orthopaedic Surgery

## 2021-01-10 DIAGNOSIS — M25571 Pain in right ankle and joints of right foot: Secondary | ICD-10-CM

## 2021-01-10 DIAGNOSIS — S93601A Unspecified sprain of right foot, initial encounter: Secondary | ICD-10-CM

## 2021-01-14 ENCOUNTER — Ambulatory Visit (INDEPENDENT_AMBULATORY_CARE_PROVIDER_SITE_OTHER): Payer: Medicare Other | Admitting: Podiatry

## 2021-01-14 ENCOUNTER — Other Ambulatory Visit: Payer: Self-pay

## 2021-01-14 DIAGNOSIS — M7750 Other enthesopathy of unspecified foot: Secondary | ICD-10-CM | POA: Diagnosis not present

## 2021-01-14 NOTE — Patient Instructions (Signed)
Recommend Voltaren Gel - anti-inflammatory gel - available at most pharmacies. Use as directed on instrutions

## 2021-01-14 NOTE — Progress Notes (Signed)
  Subjective:  Patient ID: Robert Stokes, male    DOB: 1990/12/16,  MRN: [id number]  Chief Complaint  Patient presents with   Tendonitis    4 week follow up right foot. Pt states pain has increased and the injections from last visit did not help.    30 y.o. male presents with the above complaint.  Pain appears worsened. Injection only helped for about 12 hours.  Objective:  Physical Exam: warm, good capillary refill, no trophic changes or ulcerative lesions, normal DP and PT pulses and normal sensory exam. Right Foot: POP  4th/5th metatarsal area with maximal pain at the fifth metatarsal base  Assessment:   1. Tendonitis of ankle or foot    Plan:  Patient was evaluated and treated and all questions answered.  Tendonitis right foot -Pain unchanged. He has failed NSAIDs cast immobilization boot immobilization, oral steroids, PT, injectable steroids. PT was the only thing that appeared to have helped - will refer back for aquatic therapy.  -Could consider repeat injection if absolutely needed however patient states this only helped for 12 hours and may have little utility. Could consider repeat MRI  Return in about 6 weeks (around 02/25/2021).

## 2021-01-17 ENCOUNTER — Ambulatory Visit
Admission: RE | Admit: 2021-01-17 | Discharge: 2021-01-17 | Disposition: A | Discharge status: Home / Self Care | Payer: Medicare Other | Source: Ambulatory Visit | Attending: Orthopaedic Surgery | Admitting: Orthopaedic Surgery

## 2021-01-17 ENCOUNTER — Other Ambulatory Visit: Payer: Self-pay

## 2021-01-17 DIAGNOSIS — M25571 Pain in right ankle and joints of right foot: Secondary | ICD-10-CM

## 2021-01-17 DIAGNOSIS — S93601A Unspecified sprain of right foot, initial encounter: Secondary | ICD-10-CM

## 2021-01-21 ENCOUNTER — Ambulatory Visit (INDEPENDENT_AMBULATORY_CARE_PROVIDER_SITE_OTHER): Payer: Medicare Other | Admitting: Family Medicine

## 2021-01-21 ENCOUNTER — Other Ambulatory Visit: Payer: Self-pay

## 2021-01-21 ENCOUNTER — Encounter (INDEPENDENT_AMBULATORY_CARE_PROVIDER_SITE_OTHER): Payer: Self-pay | Admitting: Family Medicine

## 2021-01-21 VITALS — BP 137/82 | HR 78 | Temp 98.2°F | Ht 71.0 in

## 2021-01-21 DIAGNOSIS — R7303 Prediabetes: Secondary | ICD-10-CM

## 2021-01-21 DIAGNOSIS — Z6841 Body Mass Index (BMI) 40.0 and over, adult: Secondary | ICD-10-CM

## 2021-01-21 MED ORDER — METFORMIN HCL 500 MG PO TABS: 500.0000 mg | ORAL_TABLET | Freq: Two times a day (BID) | ORAL | 0 refills | Status: DC

## 2021-01-21 NOTE — Progress Notes (Signed)
Chief Complaint:   OBESITY Robert Stokes is here to discuss his progress with his obesity treatment plan along with follow-up of his obesity related diagnoses. Robert Stokes is on keeping a food journal and adhering to recommended goals of 1800-2300 calories and 120+ grams of protein daily and states he is following his eating plan approximately 60% of the time. Robert Stokes states he is doing 0 minutes 0 times per week.  Today's visit was #: 8 Starting weight: 412 lbs Starting date: 08/21/2020 Today's weight: 409 lbs Today's date: 01/21/2021 Total lbs lost to date: 3 Total lbs lost since last in-office visit: 2  Interim History: Robert Stokes continues to work on weight loss. He is mindful of his eating, but exercise is limited due to right foot tendonitis which may require surgery. He notes his hunger is minimal and he frequently skips meals. He often eats with others who are in change of food selection.  Subjective:   1. Pre-diabetes Robert Stokes is out of metformin, and he hasn't picked up his next prescription yet.  Assessment/Plan:   1. Pre-diabetes Robert Stokes will continue to work on weight loss, exercise, and decreasing simple carbohydrates to help decrease the risk of diabetes. We will refill metformin at 500 mg BID #60 for 1 month.  - metFORMIN (GLUCOPHAGE) 500 MG tablet; Take 1 tablet (500 mg total) by mouth 2 (two) times daily with a meal.  Dispense: 60 tablet; Refill: 0  2. Obesity with current BMI 57.1 Robert Stokes is currently in the action stage of change. As such, his goal is to continue with weight loss efforts. He has agreed to keeping a food journal and adhering to recommended goals of 1800-2300 calories and 120+ grams of protein daily.   Robert Stokes was offered easy inexpensive protein rich recipes to help him meet his goals.  Exercise goals: Strengthening exercises.  Behavioral modification strategies: increasing lean protein intake, increasing vegetables, meal planning and cooking strategies, holiday  eating strategies , and keeping a strict food journal.  Robert Stokes has agreed to follow-up with our clinic in 4 to 5 weeks. He was informed of the importance of frequent follow-up visits to maximize his success with intensive lifestyle modifications for his multiple health conditions.   Objective:   Blood pressure 137/82, pulse 78, temperature 98.2 F (36.8 C), height 5\' 11"  (1.803 m), SpO2 97 %. Body mass index is 57.32 kg/m.  General: Cooperative, alert, well developed, in no acute distress. HEENT: Conjunctivae and lids unremarkable. Cardiovascular: Regular rhythm.  Lungs: Normal work of breathing. Neurologic: No focal deficits.   Lab Results  Component Value Date   CREATININE 0.76 09/01/2020   BUN 15 09/01/2020   NA 139 09/01/2020   K 3.8 09/01/2020   CL 102 09/01/2020   CO2 27 09/01/2020   Lab Results  Component Value Date   ALT 29 08/21/2020   AST 18 08/21/2020   ALKPHOS 109 08/21/2020   BILITOT 0.4 08/21/2020   Lab Results  Component Value Date   HGBA1C 5.0 08/21/2020   Lab Results  Component Value Date   INSULIN 23.4 08/21/2020   Lab Results  Component Value Date   TSH 2.870 08/21/2020   Lab Results  Component Value Date   CHOL 184 08/21/2020   HDL 39 (L) 08/21/2020   LDLCALC 126 (H) 08/21/2020   TRIG 104 08/21/2020   Lab Results  Component Value Date   VD25OH 12.2 (L) 08/21/2020   Lab Results  Component Value Date   WBC 9.2 09/01/2020   HGB  14.3 09/01/2020   HCT 45.7 09/01/2020   MCV 88.2 09/01/2020   PLT 246 09/01/2020   No results found for: IRON, TIBC, FERRITIN  Attestation Statements:   Reviewed by clinician on day of visit: allergies, medications, problem list, medical history, surgical history, family history, social history, and previous encounter notes.  Time spent on visit including pre-visit chart review and post-visit care and charting was 30 minutes.    I, Burt Knack, am acting as transcriptionist for Quillian Quince, MD.  I  have reviewed the above documentation for accuracy and completeness, and I agree with the above. -  Quillian Quince, MD

## 2021-02-06 ENCOUNTER — Other Ambulatory Visit: Payer: Self-pay | Admitting: Orthopaedic Surgery

## 2021-02-06 DIAGNOSIS — S93601A Unspecified sprain of right foot, initial encounter: Secondary | ICD-10-CM

## 2021-02-11 ENCOUNTER — Encounter (HOSPITAL_BASED_OUTPATIENT_CLINIC_OR_DEPARTMENT_OTHER): Payer: Self-pay | Admitting: Physical Therapy

## 2021-02-11 ENCOUNTER — Ambulatory Visit (HOSPITAL_BASED_OUTPATIENT_CLINIC_OR_DEPARTMENT_OTHER): Payer: Medicare Other | Attending: Podiatry | Admitting: Physical Therapy

## 2021-02-11 ENCOUNTER — Other Ambulatory Visit: Payer: Self-pay

## 2021-02-11 DIAGNOSIS — M25671 Stiffness of right ankle, not elsewhere classified: Secondary | ICD-10-CM | POA: Diagnosis present

## 2021-02-11 DIAGNOSIS — R262 Difficulty in walking, not elsewhere classified: Secondary | ICD-10-CM | POA: Diagnosis present

## 2021-02-11 DIAGNOSIS — M25561 Pain in right knee: Secondary | ICD-10-CM | POA: Diagnosis present

## 2021-02-11 DIAGNOSIS — R2681 Unsteadiness on feet: Secondary | ICD-10-CM | POA: Diagnosis present

## 2021-02-11 DIAGNOSIS — M7750 Other enthesopathy of unspecified foot: Secondary | ICD-10-CM | POA: Insufficient documentation

## 2021-02-11 DIAGNOSIS — M6281 Muscle weakness (generalized): Secondary | ICD-10-CM | POA: Diagnosis present

## 2021-02-11 DIAGNOSIS — R293 Abnormal posture: Secondary | ICD-10-CM | POA: Diagnosis present

## 2021-02-11 DIAGNOSIS — R2689 Other abnormalities of gait and mobility: Secondary | ICD-10-CM | POA: Insufficient documentation

## 2021-02-11 DIAGNOSIS — M79671 Pain in right foot: Secondary | ICD-10-CM | POA: Insufficient documentation

## 2021-02-11 DIAGNOSIS — G8929 Other chronic pain: Secondary | ICD-10-CM | POA: Diagnosis present

## 2021-02-11 NOTE — Therapy (Signed)
[place] [address] [place], [place], [address] Phone: [phone]   Fax:  [phone]  Physical Therapy Treatment  Patient Details  Name: Robert Stokes MRN: [id number] Date of Birth: November 12, 1990 Referring Provider (PT): Evelina Bucy, DPM   Encounter Date: 02/11/2021   PT End of Session - 02/11/21 1443     Visit Number 10    Number of Visits 15    Date for PT Re-Evaluation 03/11/21    Authorization Type UHC MCR - FOTO    PT Start Time 5009    PT Stop Time 3818    PT Time Calculation (min) 46 min    Activity Tolerance --   Pt has pain with objective testing.   Behavior During Therapy WFL for tasks assessed/performed             Past Medical History:  Diagnosis Date   ADD (attention deficit disorder)    ADHD    Asthma    Back pain    Joint pain    Obesity    Sleep apnea    SOB (shortness of breath)    Swelling of lower extremity     Past Surgical History:  Procedure Laterality Date   ADENOIDECTOMY     TONSILLECTOMY      There were no vitals filed for this visit.   Subjective Assessment - 02/11/21 1407     Subjective Pt reports having pain for > 1 year and hasn't worked in a year.  Pt presents to PT with continued reports of the same pain in R foot.  He did have a 2nd opinion at [facility].  Pt had a CT which was negative and MD ordered further MRI testing.  pt may be referred to neurologist after MRI.  Pt returned to see the referring MD and was sent back to PT for aquatic therapy.  Pt has a new dx of M77.50 (ICD-10-CM) - Tendonitis of ankle or foot.  Pt had an injection from Dr. March Rummage.  He states it helped for 12 hours and the pain returned.  Pt states the last PT did some taping and he reports improved sx's while the tape was on.  Pt states he hurt worse after ther tape came off.  Pt reports he didn't improve with prior aquatic therapy.  He reports he did feel better in the pool though the pain  returned and he had increased pain afterwards.reports he has excruciating pain with performing PF.  Pt reports compliance with HEP and has increased pain with theraband exercises.  Pt reports his pain is worsening.  Pt is very limited with ambulation distance and has significant pain with WB'ing.  He is unable to grocery shop.   Pt uses the walking boot with community ambulation.  Pt leans on sink while preparing food.  Pt reports he can stand for 10 mins and has to sit down.  He denies any functional improvements.    Pertinent History Significant PMH: history of joint and back pain    Diagnostic tests MRI IMPRESSION:  1. No acute osseous abnormality of the right forefoot. Specifically,  no evidence of stress fracture.  2. Small volume fluid within the second and third intermetatarsal  spaces, which may reflect mild bursitis.    Patient Stated Goals wants to return to week, reduce pain    Currently in Pain? Yes    Pain Score 6    current;  > 10/10 ; 6/10 best   Pain  Location Foot   anterolateral   Pain Orientation Right    Aggravating Factors  walking, standing, movement                [facility] PT Assessment - 02/11/21 0001       Assessment   Medical Diagnosis M77.50 (ICD-10-CM) - Tendonitis of ankle or foot   Stress reaction of bone (M84.30XA), Chronic foot pain, right (M79.671, G89.29), Pes planus of both feet (M21.41, M21.42), Gastrocnemius equinus of right lower extremity (M21.6X1)   Referring Provider (PT) Evelina Bucy, DPM    Onset Date/Surgical Date --   > 1 year ago     Prior Function   Vocation Requirements Dishwasher-stands for entire shift      Observation/Other Assessments   Focus on Therapeutic Outcomes (FOTO)  38      ROM / Strength   AROM / PROM / Strength PROM;AROM;Strength      AROM   AROM Assessment Site Ankle    Right/Left Ankle Right    Right Ankle Dorsiflexion --   -5 deg   Right Ankle Plantar Flexion 43    Right Ankle Inversion 20    Right Ankle Eversion  4      PROM   PROM Assessment Site Ankle    Right/Left Ankle Right    Right Ankle Dorsiflexion 9   with pain   Right Ankle Plantar Flexion 54   pain   Right Ankle Inversion 25   pain   Right Ankle Eversion 6      Strength   Overall Strength Comments --   tol slight resistance with DF and Inv with pain and minimal resistance with PF and Eve with pain     Palpation   Palpation comment No tenderness over 3rd MT.  pt was tender to palpate over cuboid      Ambulation/Gait   Gait Comments Pt ambulates on heel with significantly decreased WB'ing thru R LE and stance time on R LE.  Pt has an antalgic gait.  Absent toe off and pt does not tolerate weight thru mid foot                                    PT Education - 02/11/21 2300     Education Details POC, objective findings, and prognosis.    Person(s) Educated Patient    Methods Explanation    Comprehension Verbalized understanding                 PT Long Term Goals - 02/11/21 2321       PT LONG TERM GOAL #2   Title Robert Stokes will achieve greater than 40 degrees of closed chain ankle DF of the affected ankle (inclinometer placed just distal to tibial tuberosity; "zeroed" vertically; 1/2 kneeling if able; measured on forward leg) to normalize terminal stance phase of gait and reduce stress on posterior ankle structures  EVAL: 26 degrees in standing    Status Deferred      PT LONG TERM GOAL #3   Title Robert Stokes will report >/= 50% decrease in pain from evaluation  EVAL: 10/10 max pain, 8/10 lowest    Baseline >10/10 pain at worst    Time 4    Period Weeks    Status Not Met    Target Date 03/11/21      PT LONG TERM GOAL #4   Title Robert John  D Stokes will be able to stand for 30 min while making a meal, not limited by pain  EVAL: <5 min    Baseline Pt unable to stand for > 10 mins    Time 4    Period Weeks    Status Not Met    Target Date 03/11/21      PT LONG TERM GOAL #5    Title Robert Stokes will improve FOTO score from 35 (on evaluation) to 58 as a proxy for functional improvement    Baseline FOTO 38    Time 4    Period Weeks    Status Not Met    Target Date 03/11/21      Additional Long Term Goals   Additional Long Term Goals --      PT LONG TERM GOAL #6   Title Pt will be able to tolerate aquatic therapy without adverse effects for improved tolerance to activity.    Time 4    Period Weeks    Status New    Target Date 03/11/21      PT LONG TERM GOAL #7   Title Pt will demo improved L ankle DF AROM to be > 5 deg for improved stiffness and gait.    Time 4    Period Weeks    Status New    Target Date 03/11/21      PT LONG TERM GOAL #8   Title Pt will demo toe off with gait for improved quality of gait and to assist with returning to normalization of gait.    Time 4    Period Weeks    Status New    Target Date 03/11/21                   Plan - 02/11/21 2307     Clinical Impression Statement Pt returns to PT with a referral to start back up aquatic PT.  He has a new dx of ankle or foot tendonitis.  Pt received an injection which provided only 12 hours of relief.  Pt also had a 2nd opinion of foot/ankle.  Pt had a CT which was negative and MD ordered further MRI testing.  Pt states he didn't have improvements with prior aquatic PT.  He states he felt good during aquatic Rx though had increased pain afterwards.  Pt has had this pain for over a year and he states nothing has given him long lasting improvement.  Pt denies any improvement in function, pain and sx's.  Pt has limited ROM t/o ankle and weakness t/o ankle.  Pt has significant deficits with gait including reduced WB'ing and stance time on R LE and absent toe off.  Pt is significant limited with standing and ambulation and has been unable to work.  Pt has not progressed toward his goals.  PT added new goals today.    Rehab Potential Fair    PT Frequency 1x / week    PT Duration 4  weeks    PT Treatment/Interventions Aquatic Therapy;ADLs/Self Care Home Management;Iontophoresis 67m/ml Dexamethasone;Gait training;Therapeutic activities;Therapeutic exercise;Neuromuscular re-education;Manual techniques;Dry needling;Vasopneumatic Device;Joint Manipulations;Cryotherapy;Stair training;Functional mobility training;Patient/family education;Passive range of motion    PT Next Visit Plan Attempt another round of aquatic PT.    PT Home Exercise Plan [id number]    Consulted and Agree with Plan of Care Patient             Patient will benefit from skilled therapeutic intervention in order to improve the following deficits  and impairments:  Abnormal gait, Pain, Decreased range of motion, Decreased strength, Difficulty walking, Decreased activity tolerance, Decreased mobility, Hypomobility  Visit Diagnosis: Pain in right foot  Stiffness of right ankle, not elsewhere classified  Muscle weakness  Other abnormalities of gait and mobility     Problem List Patient Active Problem List   Diagnosis Date Noted   Super obese 10/24/2020   Sleep related headaches 10/24/2020   Episodic cluster headache, not intractable 10/24/2020   Sleeps in sitting position due to orthopnea 10/24/2020   Insomnia due to medical condition 10/24/2020   Loud snoring 10/24/2020   Excessive daytime sleepiness 10/24/2020   Other fatigue 08/27/2020   Hyperglycemia 08/27/2020   SOB (shortness of breath) on exertion 08/27/2020   Vitamin D deficiency 08/27/2020   Moderate left ankle sprain 01/13/2019    Selinda Michaels III PT, DPT 02/11/21 11:30 PM   [facility] [address] [place], [place], [address] Phone: [phone]   Fax:  [phone]  Name: Robert Stokes MRN: [id number] Date of Birth: 03-Oct-1990

## 2021-02-19 ENCOUNTER — Ambulatory Visit (HOSPITAL_BASED_OUTPATIENT_CLINIC_OR_DEPARTMENT_OTHER): Payer: Medicare Other | Admitting: Physical Therapy

## 2021-02-19 ENCOUNTER — Encounter (HOSPITAL_BASED_OUTPATIENT_CLINIC_OR_DEPARTMENT_OTHER): Payer: Self-pay | Admitting: Physical Therapy

## 2021-02-19 ENCOUNTER — Other Ambulatory Visit: Payer: Self-pay

## 2021-02-19 DIAGNOSIS — R293 Abnormal posture: Secondary | ICD-10-CM

## 2021-02-19 DIAGNOSIS — G8929 Other chronic pain: Secondary | ICD-10-CM

## 2021-02-19 DIAGNOSIS — R2681 Unsteadiness on feet: Secondary | ICD-10-CM

## 2021-02-19 DIAGNOSIS — R262 Difficulty in walking, not elsewhere classified: Secondary | ICD-10-CM

## 2021-02-19 DIAGNOSIS — R2689 Other abnormalities of gait and mobility: Secondary | ICD-10-CM

## 2021-02-19 DIAGNOSIS — M25561 Pain in right knee: Secondary | ICD-10-CM

## 2021-02-19 DIAGNOSIS — M79671 Pain in right foot: Secondary | ICD-10-CM | POA: Diagnosis not present

## 2021-02-19 DIAGNOSIS — M6281 Muscle weakness (generalized): Secondary | ICD-10-CM

## 2021-02-19 NOTE — Therapy (Signed)
[facility] [address] [place], [place], [address] Phone: [phone]   Fax:  [phone]  Physical Therapy Treatment  Patient Details  Name: Robert Stokes MRN: [id number] Date of Birth: Sep 23, 1990 Referring Provider (PT): Evelina Bucy, DPM   Encounter Date: 02/19/2021   PT End of Session - 02/19/21 0832     Visit Number 11    Number of Visits 15    Date for PT Re-Evaluation 03/11/21    Authorization Type U[employer] MCR - FOTO    PT Start Time 0815    PT Stop Time 0900    PT Time Calculation (min) 45 min    Activity Tolerance Patient tolerated treatment well   Pt has pain with objective testing.   Behavior During Therapy WFL for tasks assessed/performed             Past Medical History:  Diagnosis Date   ADD (attention deficit disorder)    ADHD    Asthma    Back pain    Joint pain    Obesity    Sleep apnea    SOB (shortness of breath)    Swelling of lower extremity     Past Surgical History:  Procedure Laterality Date   ADENOIDECTOMY     TONSILLECTOMY      There were no vitals filed for this visit.   Subjective Assessment - 02/19/21 0834     Subjective Pt reports no changes for the most part since seen at aquatics last.                                             PT Long Term Goals - 02/11/21 2321       PT LONG TERM GOAL #2   Title [name] will achieve greater than 40 degrees of closed chain ankle DF of the affected ankle (inclinometer placed just distal to tibial tuberosity; "zeroed" vertically; 1/2 kneeling if able; measured on forward leg) to normalize terminal stance phase of gait and reduce stress on posterior ankle structures  EVAL: 26 degrees in standing    Status Deferred      PT LONG TERM GOAL #3   Title [name] will report >/= 50% decrease in pain from evaluation  EVAL: 10/10 max pain, 8/10 lowest    Baseline >10/10 pain at worst     Time 4    Period Weeks    Status Not Met    Target Date 03/11/21      PT LONG TERM GOAL #4   Title [name] will be able to stand for 30 min while making a meal, not limited by pain  EVAL: <5 min    Baseline Pt unable to stand for > 10 mins    Time 4    Period Weeks    Status Not Met    Target Date 03/11/21      PT LONG TERM GOAL #5   Title [name] will improve FOTO score from 35 (on evaluation) to 58 as a proxy for functional improvement    Baseline FOTO 38    Time 4    Period Weeks    Status Not Met    Target Date 03/11/21      Additional Long Term Goals   Additional Long Term Goals --  PT LONG TERM GOAL #6   Title Pt will be able to tolerate aquatic therapy without adverse effects for improved tolerance to activity.    Time 4    Period Weeks    Status New    Target Date 03/11/21      PT LONG TERM GOAL #7   Title Pt will demo improved L ankle DF AROM to be > 5 deg for improved stiffness and gait.    Time 4    Period Weeks    Status New    Target Date 03/11/21      PT LONG TERM GOAL #8   Title Pt will demo toe off with gait for improved quality of gait and to assist with returning to normalization of gait.    Time 4    Period Weeks    Status New    Target Date 03/11/21             Pt seen for aquatic therapy today.  Treatment took place in water 3.25-4.8 ft in depth at the [facility] pool. Temp of water was 91.  Pt entered/exited the pool via stairs (step through pattern) independently with bilat rail.  Warm up: walking x 4 widths.  Added hand buoys for core engagement and aerobic capacity challenge x 6 width forward, backward and side stepping.  Exercises in standing -df x 20 -pf x20 -Long leg hip flex then extension 2 x 10 slow supported by yellow buoys. -long leg hip flex then ext 2 x 10 quick without UE support.  -full range hip flex through extension holding to hand buoys x 10 R/L Vc throughout for tightened core,  control of movement.  Seated Exercise flutter kicking at hip 3 x 30 seconds Squats bottom step forefoot suspended off step to decreased pressure on cuboid, unilateral UE support on rail 2x10    Pt requires buoyancy for support and to offload joints with strengthening exercises. Viscosity of the water is needed for resistance of strengthening; water current perturbations provides challenge to standing balance unsupported, requiring increased core activation.       Plan - 02/19/21 0853     Clinical Impression Statement Pt reintroduced to setting. Session completed mostly in 4 ft.  He reports pain in right foot remained consistant throughout but no increase with activity.  ROM/stretching of foot while incorporating core and proximal strengtheing. Did trial light hoping movment with quick hip flex and extension. Pt reports good toleration to session. Exits pool with right foot discomfort although "normal pain"    Stability/Clinical Decision Making Stable/Uncomplicated    Clinical Decision Making Low    Rehab Potential Fair    PT Frequency 1x / week    PT Duration 4 weeks    PT Treatment/Interventions Aquatic Therapy;ADLs/Self Care Home Management;Iontophoresis 61m/ml Dexamethasone;Gait training;Therapeutic activities;Therapeutic exercise;Neuromuscular re-education;Manual techniques;Dry needling;Vasopneumatic Device;Joint Manipulations;Cryotherapy;Stair training;Functional mobility training;Patient/family education;Passive range of motion    PT Next Visit Plan Toleration of last session? stretching right foot/hip and knee strengtheing    PT Home Exercise Plan [id number]             Patient will benefit from skilled therapeutic intervention in order to improve the following deficits and impairments:  Abnormal gait, Pain, Decreased range of motion, Decreased strength, Difficulty walking, Decreased activity tolerance, Decreased mobility, Hypomobility  Visit Diagnosis: Abnormal  posture  Chronic pain of right knee  Difficulty in walking, not elsewhere classified  Muscle weakness (generalized)  Unsteadiness on feet  Other abnormalities of gait and  mobility     Problem List Patient Active Problem List   Diagnosis Date Noted   Super obese 10/24/2020   Sleep related headaches 10/24/2020   Episodic cluster headache, not intractable 10/24/2020   Sleeps in sitting position due to orthopnea 10/24/2020   Insomnia due to medical condition 10/24/2020   Loud snoring 10/24/2020   Excessive daytime sleepiness 10/24/2020   Other fatigue 08/27/2020   Hyperglycemia 08/27/2020   SOB (shortness of breath) on exertion 08/27/2020   Vitamin D deficiency 08/27/2020   Moderate left ankle sprain 01/13/2019    Vedia Pereyra, PT 02/19/2021, 10:56 AM  [facility] [address] [place], [place], [address] Phone: [phone]   Fax:  [phone]  Name: Robert Stokes MRN: [id number] Date of Birth: [birth year]

## 2021-02-24 ENCOUNTER — Encounter (INDEPENDENT_AMBULATORY_CARE_PROVIDER_SITE_OTHER): Payer: Self-pay | Admitting: Family Medicine

## 2021-02-24 ENCOUNTER — Other Ambulatory Visit: Payer: Self-pay

## 2021-02-24 ENCOUNTER — Ambulatory Visit (INDEPENDENT_AMBULATORY_CARE_PROVIDER_SITE_OTHER): Payer: Medicare Other | Admitting: Family Medicine

## 2021-02-24 VITALS — BP 128/81 | HR 70 | Temp 97.9°F | Ht 71.0 in | Wt >= 6400 oz

## 2021-02-24 DIAGNOSIS — Z6841 Body Mass Index (BMI) 40.0 and over, adult: Secondary | ICD-10-CM

## 2021-02-24 DIAGNOSIS — R7303 Prediabetes: Secondary | ICD-10-CM

## 2021-02-24 MED ORDER — METFORMIN HCL 500 MG PO TABS: 500.0000 mg | ORAL_TABLET | Freq: Two times a day (BID) | ORAL | 0 refills | Status: DC

## 2021-02-25 ENCOUNTER — Ambulatory Visit: Payer: Medicare Other | Admitting: Podiatry

## 2021-02-25 NOTE — Progress Notes (Signed)
Chief Complaint:   OBESITY Stacey is here to discuss his progress with his obesity treatment plan along with follow-up of his obesity related diagnoses. Vartan is on keeping a food journal and adhering to recommended goals of 1800-2300 calories and 120+ grams of protein daily and states he is following his eating plan approximately 80% of the time. Micheil states he is doing 0 minutes 0 times per week.  Today's visit was #: 9 Starting weight: 412 lbs Starting date: 08/21/2020 Today's weight: 411 lbs Today's date: 02/24/2021 Total lbs lost to date: 1 Total lbs lost since last in-office visit: 0  Interim History: Cahlil was able to avoid significant holiday weight gain. He states his hunger is mostly controlled, but he is unable to eat much of his protein due to cost reasons.  Subjective:   1. Pre-diabetes Yuuki is doing well on metformin. He takes with a meal but he notes feeling tired approximately 30 minutes after a meal. He denies signs of hypoglycemia.  Assessment/Plan:   1. Pre-diabetes Radin will continue to work on weight loss, exercise, and decreasing simple carbohydrates to help decrease the risk of diabetes. We will refill metformin for 2 months.  - metFORMIN (GLUCOPHAGE) 500 MG tablet; Take 1 tablet (500 mg total) by mouth 2 (two) times daily with a meal.  Dispense: 60 tablet; Refill: 1  2. Obesity with current BMI of 57.4 Brydon is currently in the action stage of change. As such, his goal is to continue with weight loss efforts. He has agreed to keeping a food journal and adhering to recommended goals of 2000 calories and 120+ grams of protein daily.   Brek is ok to use protein supplements if needed to help him meet his goals, and less expensive options were discussed  Exercise goals: Start at the gym 2 times per week.  Behavioral modification strategies: increasing lean protein intake and meal planning and cooking strategies.  Zyshawn has agreed to follow-up  with our clinic in 6 weeks. He was informed of the importance of frequent follow-up visits to maximize his success with intensive lifestyle modifications for his multiple health conditions.   Objective:   Blood pressure 128/81, pulse 70, temperature 97.9 F (36.6 C), height 5\' 11"  (1.803 m), weight (!) 411 lb (186.4 kg), SpO2 97 %. Body mass index is 57.32 kg/m.  General: Cooperative, alert, well developed, in no acute distress. HEENT: Conjunctivae and lids unremarkable. Cardiovascular: Regular rhythm.  Lungs: Normal work of breathing. Neurologic: No focal deficits.   Lab Results  Component Value Date   CREATININE 0.76 09/01/2020   BUN 15 09/01/2020   NA 139 09/01/2020   K 3.8 09/01/2020   CL 102 09/01/2020   CO2 27 09/01/2020   Lab Results  Component Value Date   ALT 29 08/21/2020   AST 18 08/21/2020   ALKPHOS 109 08/21/2020   BILITOT 0.4 08/21/2020   Lab Results  Component Value Date   HGBA1C 5.0 08/21/2020   Lab Results  Component Value Date   INSULIN 23.4 08/21/2020   Lab Results  Component Value Date   TSH 2.870 08/21/2020   Lab Results  Component Value Date   CHOL 184 08/21/2020   HDL 39 (L) 08/21/2020   LDLCALC 126 (H) 08/21/2020   TRIG 104 08/21/2020   Lab Results  Component Value Date   VD25OH 12.2 (L) 08/21/2020   Lab Results  Component Value Date   WBC 9.2 09/01/2020   HGB 14.3 09/01/2020  HCT 45.7 09/01/2020   MCV 88.2 09/01/2020   PLT 246 09/01/2020   No results found for: IRON, TIBC, FERRITIN  Obesity Behavioral Intervention:   Approximately 15 minutes were spent on the discussion below.  ASK: We discussed the diagnosis of obesity with [name] today and Samil agreed to give [name] permission to discuss obesity behavioral modification therapy today.  ASSESS: Kenyada has the diagnosis of obesity and his BMI today is 22.4. Avion is in the action stage of change.   ADVISE: Eren was educated on the multiple health risks of obesity as  well as the benefit of weight loss to improve his health. He was advised of the need for long term treatment and the importance of lifestyle modifications to improve his current health and to decrease his risk of future health problems.  AGREE: Multiple dietary modification options and treatment options were discussed and Diesel agreed to follow the recommendations documented in the above note.  ARRANGE: Delsin was educated on the importance of frequent visits to treat obesity as outlined per CMS and USPSTF guidelines and agreed to schedule his next follow up appointment today.  Attestation Statements:   Reviewed by clinician on day of visit: allergies, medications, problem list, medical history, surgical history, family history, social history, and previous encounter notes.   I, Burt Knack, am acting as transcriptionist for Quillian Quince, MD.  I have reviewed the above documentation for accuracy and completeness, and I agree with the above. -  Quillian Quince, MD

## 2021-02-27 ENCOUNTER — Other Ambulatory Visit: Payer: Self-pay

## 2021-02-27 ENCOUNTER — Telehealth: Payer: Self-pay | Admitting: Neurology

## 2021-02-27 ENCOUNTER — Ambulatory Visit (HOSPITAL_BASED_OUTPATIENT_CLINIC_OR_DEPARTMENT_OTHER): Payer: Medicare Other | Attending: Podiatry | Admitting: Physical Therapy

## 2021-02-27 ENCOUNTER — Encounter (HOSPITAL_BASED_OUTPATIENT_CLINIC_OR_DEPARTMENT_OTHER): Payer: Self-pay | Admitting: Physical Therapy

## 2021-02-27 DIAGNOSIS — M79671 Pain in right foot: Secondary | ICD-10-CM | POA: Diagnosis not present

## 2021-02-27 DIAGNOSIS — R2689 Other abnormalities of gait and mobility: Secondary | ICD-10-CM | POA: Diagnosis present

## 2021-02-27 DIAGNOSIS — M25671 Stiffness of right ankle, not elsewhere classified: Secondary | ICD-10-CM | POA: Diagnosis present

## 2021-02-27 DIAGNOSIS — M6281 Muscle weakness (generalized): Secondary | ICD-10-CM | POA: Insufficient documentation

## 2021-02-27 NOTE — Telephone Encounter (Signed)
Pt was scheduled for his Initial CPAP visit on 05/26/2021. Pt was informed to bring machine and power to the appt. DME: [facility] [phone] option 1 F: [phone] Equipment Issued: Doy Mince G3 set up on 02/25/2021 Schedule between 03/28/2021-05/26/2021

## 2021-02-27 NOTE — Therapy (Signed)
[facility] [place], [place], [address] Phone: [phone]   Fax:  [phone]  Physical Therapy Treatment  Patient Details  Name: Robert Stokes MRN: [id number] Date of Birth: 11-24-1990 Referring Provider (PT): Evelina Bucy, DPM   Encounter Date: 02/27/2021   PT End of Session - 02/27/21 1022     Visit Number 12    Number of Visits 15    Date for PT Re-Evaluation 03/11/21    Authorization Type UHC MCR - FOTO    PT Start Time 0931    PT Stop Time 1016    PT Time Calculation (min) 45 min    Activity Tolerance Patient tolerated treatment well    Behavior During Therapy [name] for tasks assessed/performed             Past Medical History:  Diagnosis Date   ADD (attention deficit disorder)    ADHD    Asthma    Back pain    Joint pain    Obesity    Sleep apnea    SOB (shortness of breath)    Swelling of lower extremity     Past Surgical History:  Procedure Laterality Date   ADENOIDECTOMY     TONSILLECTOMY      There were no vitals filed for this visit.   Subjective Assessment - 02/27/21 0940     Subjective He states he feels the same and his pain is gettting worse.  Pt is very limited with ambulation distance and has significant pain with WB'ing. He uses electric scooter if he grocery shops.  He continues to have constant pain.  He denies any improvements.  pt reports he felt "semi-ok" after prior Rx.  He had minimal improvement in pain for 1 hour but the pain returned 1.5 hours after Rx.  pt reports his foot feels better when it pops.  He states his ankle pops 1-2x/day.  Pt states he is having a MRI on Saturday.    Pertinent History Significant PMH: history of joint and back pain    Currently in Pain? Yes    Pain Score 8    4/10 best pain   Pain Location --   anterolateral   Pain Orientation Right            Pt seen for aquatic therapy today.  Treatment took place in water 3.5-4.8 ft in  depth at the [facility] pool. Temp of water was 95.  Pt entered/exited the pool via stairs independently with bilat rail.   Reviewed response to prior Rx, pain level, and current function. walking x 4 widths.  Added hand buoys for core engagement and aerobic capacity challenge x 6 width forward, backward and side stepping.   Exercises in standing -df x 20 in 4 ft -pf x20 in 4 ft 8 inch -Marching with bilat UE assist on pool wall x 20 reps -Standing hip flexion with 1 UE assist on pool wall and 1 Ue assist on noodle 2x10 reps -Weight shifts with UE assist on yellow noodle 2x10 reps f/b and s/s -Pt ambulated fwd approx 4 reps with cuing for heel to toe gait and ambulated bwd approx 3-4 reps with cuing for toe to heel gait -Pt received R ankle PROM in DF, PF, Eve, and Inv.      Pt requires buoyancy for support and to offload joints with strengthening exercises. Viscosity of the water is needed for resistance of strengthening; water current perturbations provides challenge to  standing balance unsupported, requiring increased core activation.                                      PT Long Term Goals - 02/11/21 2321       PT LONG TERM GOAL #2   Title Robert Stokes will achieve greater than 40 degrees of closed chain ankle DF of the affected ankle (inclinometer placed just distal to tibial tuberosity; "zeroed" vertically; 1/2 kneeling if able; measured on forward leg) to normalize terminal stance phase of gait and reduce stress on posterior ankle structures  EVAL: 26 degrees in standing    Status Deferred      PT LONG TERM GOAL #3   Title Robert Stokes will report >/= 50% decrease in pain from evaluation  EVAL: 10/10 max pain, 8/10 lowest    Baseline >10/10 pain at worst    Time 4    Period Weeks    Status Not Met    Target Date 03/11/21      PT LONG TERM GOAL #4   Title Robert Stokes will be able to stand for 30 min while making a meal, not  limited by pain  EVAL: <5 min    Baseline Pt unable to stand for > 10 mins    Time 4    Period Weeks    Status Not Met    Target Date 03/11/21      PT LONG TERM GOAL #5   Title Robert Stokes will improve FOTO score from 35 (on evaluation) to 58 as a proxy for functional improvement    Baseline FOTO 38    Time 4    Period Weeks    Status Not Met    Target Date 03/11/21      Additional Long Term Goals   Additional Long Term Goals --      PT LONG TERM GOAL #6   Title Pt will be able to tolerate aquatic therapy without adverse effects for improved tolerance to activity.    Time 4    Period Weeks    Status New    Target Date 03/11/21      PT LONG TERM GOAL #7   Title Pt will demo improved L ankle DF AROM to be > 5 deg for improved stiffness and gait.    Time 4    Period Weeks    Status New    Target Date 03/11/21      PT LONG TERM GOAL #8   Title Pt will demo toe off with gait for improved quality of gait and to assist with returning to normalization of gait.    Time 4    Period Weeks    Status New    Target Date 03/11/21                   Plan - 02/27/21 1824     Clinical Impression Statement Pt continues to report the same high level of pain.  He is very limited with ambulation and continues to ambulate on heel not putting foot flat on the ground.  Pt is able to ambulate with improved heel to toe gait pattern in the pool due to buoyancy of water.  Pt performed fwd and bwd ambulation in the pool at 4 ft 8inches with cuing for heel to toe gait and toe to heel gait respectively.  Pt  was able to perform heel raises in 4 ft 8 inches without increased pain though states he has pain performing toe raises at 4 ft depth.  Pt gives good effort with all exercises and performed exercises well with cuing for correct form.  Pt responded well to Rx reporting improved pain from 8/10 before Rx to 6/10 after Rx.    PT Treatment/Interventions Aquatic Therapy;ADLs/Self Care Home  Management;Iontophoresis 43m/ml Dexamethasone;Gait training;Therapeutic activities;Therapeutic exercise;Neuromuscular re-education;Manual techniques;Dry needling;Vasopneumatic Device;Joint Manipulations;Cryotherapy;Stair training;Functional mobility training;Patient/family education;Passive range of motion    PT Next Visit Plan cont with aquatic therapy.  cont with ankle PROM, stretching and LE strengthening per pt tolerance. Assess respons to Rx.    PT Home Exercise Plan [id number]    Consulted and Agree with Plan of Care Patient             Patient will benefit from skilled therapeutic intervention in order to improve the following deficits and impairments:  Abnormal gait, Pain, Decreased range of motion, Decreased strength, Difficulty walking, Decreased activity tolerance, Decreased mobility, Hypomobility  Visit Diagnosis: Pain in right foot  Stiffness of right ankle, not elsewhere classified  Muscle weakness  Other abnormalities of gait and mobility     Problem List Patient Active Problem List   Diagnosis Date Noted   Super obese 10/24/2020   Sleep related headaches 10/24/2020   Episodic cluster headache, not intractable 10/24/2020   Sleeps in sitting position due to orthopnea 10/24/2020   Insomnia due to medical condition 10/24/2020   Loud snoring 10/24/2020   Excessive daytime sleepiness 10/24/2020   Other fatigue 08/27/2020   Hyperglycemia 08/27/2020   SOB (shortness of breath) on exertion 08/27/2020   Vitamin D deficiency 08/27/2020   Moderate left ankle sprain 01/13/2019    RSelinda MichaelsIII PT, DPT 02/27/21 6:36 PM   [facility] [address] [place] [address]Phone: [phone]  Fax:  [phone] Name: Robert FERRARIMRN: [id number] of Birth: [birth year]

## 2021-02-28 ENCOUNTER — Ambulatory Visit (INDEPENDENT_AMBULATORY_CARE_PROVIDER_SITE_OTHER): Payer: Medicare Other | Admitting: Podiatry

## 2021-02-28 DIAGNOSIS — M79671 Pain in right foot: Secondary | ICD-10-CM | POA: Diagnosis not present

## 2021-02-28 DIAGNOSIS — M7750 Other enthesopathy of unspecified foot: Secondary | ICD-10-CM

## 2021-02-28 DIAGNOSIS — M2142 Flat foot [pes planus] (acquired), left foot: Secondary | ICD-10-CM

## 2021-02-28 DIAGNOSIS — G8929 Other chronic pain: Secondary | ICD-10-CM

## 2021-02-28 DIAGNOSIS — M2141 Flat foot [pes planus] (acquired), right foot: Secondary | ICD-10-CM

## 2021-02-28 DIAGNOSIS — M8430XA Stress fracture, unspecified site, initial encounter for fracture: Secondary | ICD-10-CM

## 2021-02-28 NOTE — Progress Notes (Signed)
  Subjective:  Patient ID: Robert Stokes, male    DOB: [birth year],  MRN: [id number]  Chief Complaint  Patient presents with   Foot Pain    6 w fu-  pain is between same and worst. Patient states pain is constant , nothing is helping.    30 y.o. male presents with the above complaint.  Pain about the same or worse. Has started PT doing aquatic therapy which does not cause pain while he does it but causes flare up later that night. Objective:  Physical Exam: warm, good capillary refill, no trophic changes or ulcerative lesions, normal DP and PT pulses and normal sensory exam. Right Foot: POP  4th/5th metatarsal area with maximal pain at the fifth metatarsal base  Assessment:   1. Tendonitis of ankle or foot   2. Stress reaction of bone   3. Chronic foot pain, right   4. Pes planus of both feet     Plan:  Patient was evaluated and treated and all questions answered.  Tendonitis right foot -Has appt tomorrow for MRI foot and ankle ordered by his PCP. Will review.  -Hold off repeat injection to not affect MRI resuilts. -Continue PT.   Return in about 2 weeks (around 03/14/2021) for MRI review.

## 2021-03-01 ENCOUNTER — Ambulatory Visit
Admission: RE | Admit: 2021-03-01 | Discharge: 2021-03-01 | Disposition: A | Discharge status: Home / Self Care | Payer: Medicare Other | Source: Ambulatory Visit | Attending: Orthopaedic Surgery | Admitting: Orthopaedic Surgery

## 2021-03-01 ENCOUNTER — Other Ambulatory Visit: Payer: Self-pay

## 2021-03-01 DIAGNOSIS — S93601A Unspecified sprain of right foot, initial encounter: Secondary | ICD-10-CM

## 2021-03-04 ENCOUNTER — Other Ambulatory Visit: Payer: Self-pay

## 2021-03-04 ENCOUNTER — Ambulatory Visit (HOSPITAL_BASED_OUTPATIENT_CLINIC_OR_DEPARTMENT_OTHER): Payer: Medicare Other | Admitting: Physical Therapy

## 2021-03-04 ENCOUNTER — Encounter (HOSPITAL_BASED_OUTPATIENT_CLINIC_OR_DEPARTMENT_OTHER): Payer: Self-pay | Admitting: Physical Therapy

## 2021-03-04 DIAGNOSIS — M79671 Pain in right foot: Secondary | ICD-10-CM

## 2021-03-04 DIAGNOSIS — M6281 Muscle weakness (generalized): Secondary | ICD-10-CM

## 2021-03-04 DIAGNOSIS — M25671 Stiffness of right ankle, not elsewhere classified: Secondary | ICD-10-CM

## 2021-03-04 DIAGNOSIS — R2689 Other abnormalities of gait and mobility: Secondary | ICD-10-CM

## 2021-03-04 NOTE — Therapy (Signed)
[place], [place], [address] Phone: [phone]   Fax:  [phone]  Physical Therapy Treatment  Patient Details  Name: Robert Stokes MRN: [id number] Date of Birth: March 17, 1991 Referring Provider (PT): Evelina Bucy, DPM   Encounter Date: 03/04/2021   PT End of Session - 03/04/21 1440     Visit Number 13    Number of Visits 15    Date for PT Re-Evaluation 03/11/21    Authorization Type UHC MCR - FOTO    PT Start Time 6720    PT Stop Time 1520    PT Time Calculation (min) 45 min    Activity Tolerance --   Pt had increased pain after Rx   Behavior During Therapy WFL for tasks assessed/performed             Past Medical History:  Diagnosis Date   ADD (attention deficit disorder)    ADHD    Asthma    Back pain    Joint pain    Obesity    Sleep apnea    SOB (shortness of breath)    Swelling of lower extremity     Past Surgical History:  Procedure Laterality Date   ADENOIDECTOMY     TONSILLECTOMY      There were no vitals filed for this visit.   Subjective Assessment - 03/04/21 1440     Subjective He states he feels the same and his pain is gettting worse.   Pt is very limited with ambulation distance and has significant pain with WB'ing.  Pt has walked approx 1 hour this entire week.  Pt wore his walking boot at church; he reports the boot helps.  He uses electric scooter if he grocery shops.  He continues to have constant pain.  Pt reports his foot feels better when it pops.  He states his ankle pops 1-2x/day.  Pt had a MRI on Saturday.  Pt states he had increased pain and felt like a "mac truck" hit him when he sat down at home after prior Rx.  Pt couldn't stand to cook.  pt states he had a little worse pain after prior Rx than typical.  Pt states one small improvement he has had is "not cyring myself to sleep"    Currently in Pain? Yes    Pain Score 9     Pain Location --    anterolateral   Pain Orientation Right              Pt seen for aquatic therapy today.  Treatment took place in water 3.5-4.8 ft in depth at the [facility] pool. Temp of water was 93.  Pt entered/exited the pool via stairs independently with bilat rail.   Reviewed response to prior Rx, pain level, and current function.    Exercises in standing -df x 20 in 4 ft -pf x20 in 4 ft 8 inch -Marching with bilat UE assist on pool wall x 20 reps -Standing hip flexion with 1 UE assist on pool wall and 1 Ue assist on noodle 2x10 reps -Weight shifts with UE assist on yellow noodle 2x10 reps f/b and s/s -Pt ambulated fwd with cuing for heel to toe gait and ambulated bwd with cuing for toe to heel gait -Pt received R ankle PROM in DF, PF, Eve, and Inv.  -Seated ankle pumps x 20 reps -Seated ankle ABC x 1 rep   Pt requires buoyancy for support and to offload  joints with strengthening exercises. Viscosity of the water is needed for resistance of strengthening; water current perturbations provides challenge to standing balance unsupported, requiring increased core activation.                          PT Education - 03/04/21 2254     Education Details gait training, exercise form    Person(s) Educated Patient    Methods Explanation;Demonstration;Verbal cues    Comprehension Verbal cues required;Returned demonstration;Verbalized understanding                 PT Long Term Goals - 02/11/21 2321       PT LONG TERM GOAL #2   Title [name] will achieve greater than 40 degrees of closed chain ankle DF of the affected ankle (inclinometer placed just distal to tibial tuberosity; "zeroed" vertically; 1/2 kneeling if able; measured on forward leg) to normalize terminal stance phase of gait and reduce stress on posterior ankle structures  EVAL: 26 degrees in standing    Status Deferred      PT LONG TERM GOAL #3   Title [name] will report >/= 50%  decrease in pain from evaluation  EVAL: 10/10 max pain, 8/10 lowest    Baseline >10/10 pain at worst    Time 4    Period Weeks    Status Not Met    Target Date 03/11/21      PT LONG TERM GOAL #4   Title MARKHI KLECKNER will be able to stand for 30 min while making a meal, not limited by pain  EVAL: <5 min    Baseline Pt unable to stand for > 10 mins    Time 4    Period Weeks    Status Not Met    Target Date 03/11/21      PT LONG TERM GOAL #5   Title HERBERT MARKEN will improve FOTO score from 35 (on evaluation) to 58 as a proxy for functional improvement    Baseline FOTO 38    Time 4    Period Weeks    Status Not Met    Target Date 03/11/21      Additional Long Term Goals   Additional Long Term Goals --      PT LONG TERM GOAL #6   Title Pt will be able to tolerate aquatic therapy without adverse effects for improved tolerance to activity.    Time 4    Period Weeks    Status New    Target Date 03/11/21      PT LONG TERM GOAL #7   Title Pt will demo improved L ankle DF AROM to be > 5 deg for improved stiffness and gait.    Time 4    Period Weeks    Status New    Target Date 03/11/21      PT LONG TERM GOAL #8   Title Pt will demo toe off with gait for improved quality of gait and to assist with returning to normalization of gait.    Time 4    Period Weeks    Status New    Target Date 03/11/21                   Plan - 03/04/21 1626     Clinical Impression Statement Pt continues to report the same high level of pain and had increased pain after prior Rx. He is very  limited with ambulation and mobility due to pain.  Pt is able to ambulate with improved heel to toe gait pattern in the pool due to buoyancy of water though does require cuing for gait pattern.  Pt performed fwd and bwd ambulation in the pool with cuing for heel to toe gait and toe to heel gait respectively.  Pt gives good effort with all exercises and performed exercises well with cuing for  correct form.  Pt reports having increased pain after Rx when walking after Rx.  His pain increased from 9/10 to 11/10 after Rx.    PT Treatment/Interventions Aquatic Therapy;ADLs/Self Care Home Management;Iontophoresis 51m/ml Dexamethasone;Gait training;Therapeutic activities;Therapeutic exercise;Neuromuscular re-education;Manual techniques;Dry needling;Vasopneumatic Device;Joint Manipulations;Cryotherapy;Stair training;Functional mobility training;Patient/family education;Passive range of motion    PT Next Visit Plan cont with aquatic therapy.  cont with ankle PROM, stretching and LE strengthening per pt tolerance. Assess respons to Rx.    PT Home Exercise Plan [id number]    Consulted and Agree with Plan of Care Patient             Patient will benefit from skilled therapeutic intervention in order to improve the following deficits and impairments:  Abnormal gait, Pain, Decreased range of motion, Decreased strength, Difficulty walking, Decreased activity tolerance, Decreased mobility, Hypomobility  Visit Diagnosis: Pain in right foot  Stiffness of right ankle, not elsewhere classified  Muscle weakness  Other abnormalities of gait and mobility     Problem List Patient Active Problem List   Diagnosis Date Noted   Super obese 10/24/2020   Sleep related headaches 10/24/2020   Episodic cluster headache, not intractable 10/24/2020   Sleeps in sitting position due to orthopnea 10/24/2020   Insomnia due to medical condition 10/24/2020   Loud snoring 10/24/2020   Excessive daytime sleepiness 10/24/2020   Other fatigue 08/27/2020   Hyperglycemia 08/27/2020   SOB (shortness of breath) on exertion 08/27/2020   Vitamin D deficiency 08/27/2020   Moderate left ankle sprain 01/13/2019    RSelinda MichaelsIII PT, DPT 03/04/21 10:56 PM   [facility] [address] [address]Phone: [phone]  Fax:   [phone] Name: Robert KISSOONMRN: [id number] of Birth: 5May 14, 1992

## 2021-03-11 ENCOUNTER — Ambulatory Visit (HOSPITAL_BASED_OUTPATIENT_CLINIC_OR_DEPARTMENT_OTHER): Payer: Medicare Other | Admitting: Physical Therapy

## 2021-03-11 ENCOUNTER — Other Ambulatory Visit: Payer: Self-pay

## 2021-03-11 ENCOUNTER — Encounter (HOSPITAL_BASED_OUTPATIENT_CLINIC_OR_DEPARTMENT_OTHER): Payer: Self-pay | Admitting: Physical Therapy

## 2021-03-11 DIAGNOSIS — R2689 Other abnormalities of gait and mobility: Secondary | ICD-10-CM

## 2021-03-11 DIAGNOSIS — M79671 Pain in right foot: Secondary | ICD-10-CM | POA: Diagnosis not present

## 2021-03-11 DIAGNOSIS — M6281 Muscle weakness (generalized): Secondary | ICD-10-CM

## 2021-03-11 DIAGNOSIS — M25671 Stiffness of right ankle, not elsewhere classified: Secondary | ICD-10-CM

## 2021-03-11 NOTE — Therapy (Signed)
[place], [place], [address] Phone: [phone]   Fax:  [phone]  Physical Therapy Treatment  Patient Details  Name: Robert Stokes MRN: [id number] Date of Birth: 10-17-1990 Referring Provider (PT): Evelina Bucy, DPM   Encounter Date: 03/11/2021   PT End of Session - 03/11/21 1534     Visit Number 13    Number of Visits 15    Date for PT Re-Evaluation 03/11/21    Authorization Type UHC MCR - FOTO    PT Start Time 1525    PT Stop Time 1610    PT Time Calculation (min) 45 min    Activity Tolerance Patient limited by pain   Pt had increased pain after Rx   Behavior During Therapy [facility] for tasks assessed/performed             Past Medical History:  Diagnosis Date   ADD (attention deficit disorder)    ADHD    Asthma    Back pain    Joint pain    Obesity    Sleep apnea    SOB (shortness of breath)    Swelling of lower extremity     Past Surgical History:  Procedure Laterality Date   ADENOIDECTOMY     TONSILLECTOMY      There were no vitals filed for this visit.   Subjective Assessment - 03/11/21 1528     Subjective Results from MRI. Return to MD Friday.  May be referred for a nerve block. No changes in pain    Currently in Pain? Yes    Pain Score 6     Pain Location Foot    Pain Orientation Right    Pain Descriptors / Indicators Aching;Sharp    Pain Onset More than a month ago    Pain Frequency Constant                                        PT Education - 03/11/21 1530     Education Details " marrow edema" definition/management    Person(s) Educated Patient    Methods Explanation    Comprehension Verbalized understanding                 PT Long Term Goals - 03/11/21 1816       PT LONG TERM GOAL #2   Status Deferred      PT LONG TERM GOAL #4   Status Not Met      PT LONG TERM GOAL #6   Baseline Pt tolerates aquatics with  some foot discomfort but without adverse effects.    Status Achieved      PT LONG TERM GOAL #7   Baseline DF measurement 7degrees manually. Some discomfort    Status Achieved      PT LONG TERM GOAL #8   Baseline Pt able to complete in pool but with discomfort midfoot.  Unable to translate on land.    Status On-going           Pt seen for aquatic therapy today.  Treatment took place in water 3.5-4.8 ft in depth at the [facility] pool. Temp of water was 93.  Pt entered/exited the pool via stairs independently with bilat rail.   Reviewed response to prior Rx, pain level, and current function.     Exercises in standing -df x 20 in 4  ft -pf x20 in 4 ft 8 inch -Marching with bilat UE assist on pool wall x 20 reps -Standing hip flexion with 1 UE assist on pool wall 2x12 -Weight shifts with UE assist on yellow noodle 2x10 reps f/b and s/s -Pt ambulated fwd with cuing for heel to toe gait and ambulated bwd with cuing for toe to heel gait -Seated ankle pumps x 20 reps -Seated ankle ABC x 1 rep   Pt requires buoyancy for support and to offload joints with strengthening exercises. Viscosity of the water is needed for resistance of strengthening; water current perturbations provides challenge to standing balance unsupported, requiring increased core activation.          Plan - 03/11/21 1603     Clinical Impression Statement Pain continues.  MRI results "marrow edema at the base of the third and fourth  metatarsals, which may be stress related." Therapy at best has relieved sx for a few hours.  He sees MD on Friday this week. I assisted him in sizing for a compression stocking as pt reports the pressure on his foot when submerged in 4 ft feels good. Pt edu that it may also decrease edema in area.  Pt will consider purchasing.  He has met his max potnetial in aqutic therapy meeting 3/5 LTG.  DC    Stability/Clinical Decision Making Stable/Uncomplicated    Clinical Decision Making  Low    Rehab Potential Fair    PT Frequency 1x / week    PT Duration 4 weeks    PT Treatment/Interventions Aquatic Therapy;ADLs/Self Care Home Management;Iontophoresis 83m/ml Dexamethasone;Gait training;Therapeutic activities;Therapeutic exercise;Neuromuscular re-education;Manual techniques;Dry needling;Vasopneumatic Device;Joint Manipulations;Cryotherapy;Stair training;Functional mobility training;Patient/family education;Passive range of motion    PT Next Visit Plan DC             Patient will benefit from skilled therapeutic intervention in order to improve the following deficits and impairments:  Abnormal gait, Pain, Decreased range of motion, Decreased strength, Difficulty walking, Decreased activity tolerance, Decreased mobility, Hypomobility  Visit Diagnosis: Pain in right foot  Stiffness of right ankle, not elsewhere classified  Muscle weakness  Other abnormalities of gait and mobility     Problem List Patient Active Problem List   Diagnosis Date Noted   Super obese 10/24/2020   Sleep related headaches 10/24/2020   Episodic cluster headache, not intractable 10/24/2020   Sleeps in sitting position due to orthopnea 10/24/2020   Insomnia due to medical condition 10/24/2020   Loud snoring 10/24/2020   Excessive daytime sleepiness 10/24/2020   Other fatigue 08/27/2020   Hyperglycemia 08/27/2020   SOB (shortness of breath) on exertion 08/27/2020   Vitamin D deficiency 08/27/2020   Moderate left ankle sprain 01/13/2019  PHYSICAL THERAPY DISCHARGE SUMMARY  Visits from Start of Care: 02/11/21  Current functional level related to goals / functional outcomes: Pt has not returned yet to work. He is indep with ADL's    Remaining deficits: Pain, and gait deviation   Education / Equipment: Management of dysfunction   Patient agrees to discharge. Patient goals were partially met. Patient is being discharged due to maximized rehab potential.    Robert Stokes(Robert Stokes  MPT 03/11/2021, 6:31 PM  [facility] [address] [address]Phone: [phone]  Fax:  [phone] Name: Robert BABAMRN: [id number] of Birth: 503/09/1990

## 2021-03-12 ENCOUNTER — Ambulatory Visit: Payer: Medicare Other | Admitting: Neurology

## 2021-03-14 ENCOUNTER — Ambulatory Visit (INDEPENDENT_AMBULATORY_CARE_PROVIDER_SITE_OTHER): Payer: Medicare Other | Admitting: Podiatry

## 2021-03-14 ENCOUNTER — Other Ambulatory Visit: Payer: Self-pay

## 2021-03-14 DIAGNOSIS — M8430XA Stress fracture, unspecified site, initial encounter for fracture: Secondary | ICD-10-CM | POA: Diagnosis not present

## 2021-03-14 DIAGNOSIS — G8929 Other chronic pain: Secondary | ICD-10-CM | POA: Diagnosis not present

## 2021-03-14 DIAGNOSIS — M79671 Pain in right foot: Secondary | ICD-10-CM

## 2021-03-14 DIAGNOSIS — M7750 Other enthesopathy of unspecified foot: Secondary | ICD-10-CM

## 2021-03-14 NOTE — Progress Notes (Signed)
°  Subjective:  Patient ID: Robert Stokes, male    DOB: February 11, 1991,  MRN: [id number]  Chief Complaint  Patient presents with   Tendonitis       2 weeks (around 03/14/2021) for MRI review.    30 y.o. male presents with the above complaint.  Pain unchanged. Had MRI and here for review. States he is all done with PT - ran out of visits, but felt that they were helping. Objective:  Physical Exam: warm, good capillary refill, no trophic changes or ulcerative lesions, normal DP and PT pulses and normal sensory exam. Right Foot: POP  4th/5th metatarsal area with maximal pain at the fifth metatarsal base  MRI 12/3 Study Result  Narrative & Impression  CLINICAL DATA:  Chronic right foot pain, weakness, swelling, and numbness.   EXAM: MRI OF THE RIGHT FOREFOOT WITHOUT CONTRAST; MRI OF THE RIGHT ANKLE WITHOUT CONTRAST   TECHNIQUE: Multiplanar, multisequence MR imaging of the right foot and ankle was performed. No intravenous contrast was administered.   COMPARISON:  CT right foot and ankle dated January 17, 2021. MRI right foot dated November 01, 2020.   FINDINGS: TENDONS   Peroneal: Peroneal longus tendon intact. Peroneal brevis intact.   Posteromedial: Posterior tibial tendon intact. Flexor digitorum longus tendon intact. Flexor hallucis longus tendon intact.   Anterior: Tibialis anterior tendon intact. Extensor hallucis longus tendon intact Extensor digitorum longus tendon intact.   Achilles:  Intact.   Plantar Fascia: Intact.   LIGAMENTS   Lateral: Anterior talofibular ligament intact. Calcaneofibular ligament intact. Posterior talofibular ligament intact. Anterior and posterior tibiofibular ligaments intact.   Medial: Deltoid ligament intact. Spring ligament intact.   Toe collateral ligaments are intact.   CARTILAGE   Ankle Joint: No joint effusion. Normal ankle mortise. No chondral defect.   Subtalar Joints/Sinus Tarsi: Normal subtalar joints. No subtalar joint  effusion. Normal sinus tarsi.   Bones: There is new mild marrow edema at the base of the third and fourth metatarsals. No fracture or dislocation.   Soft Tissue: No soft tissue mass or fluid collection. No muscle edema or atrophy.   IMPRESSION: 1. New mild marrow edema at the base of the third and fourth metatarsals, which may be stress related. No fracture. 2. Normal MRI of the right ankle.    Assessment:   1. Stress reaction of bone   2. Chronic foot pain, right   3. Tendonitis of ankle or foot     Plan:  Patient was evaluated and treated and all questions answered.  Tendonitis right foot  -MRI reviewed with patient. Stress reaction only but no definite stress fracture. For this stress process will immobilize in CAM boot. After 6 weeks we will have to focus on shoegear and inserts to prevent lateral foot stress. Boot is in adequate condition per patient, does not need new one. -Continue PT. New order for additional visits. Benefit from aquatic therapy to off-load weight from the area.  Return in about 6 weeks (around 04/25/2021).

## 2021-04-07 ENCOUNTER — Other Ambulatory Visit: Payer: Self-pay

## 2021-04-07 ENCOUNTER — Encounter (INDEPENDENT_AMBULATORY_CARE_PROVIDER_SITE_OTHER): Payer: Self-pay | Admitting: Family Medicine

## 2021-04-07 ENCOUNTER — Ambulatory Visit (INDEPENDENT_AMBULATORY_CARE_PROVIDER_SITE_OTHER): Payer: Medicare Other | Admitting: Family Medicine

## 2021-04-07 VITALS — BP 131/81 | HR 66 | Temp 98.0°F | Ht 71.0 in | Wt >= 6400 oz

## 2021-04-07 DIAGNOSIS — Z6841 Body Mass Index (BMI) 40.0 and over, adult: Secondary | ICD-10-CM | POA: Diagnosis not present

## 2021-04-07 DIAGNOSIS — R7303 Prediabetes: Secondary | ICD-10-CM

## 2021-04-07 MED ORDER — RYBELSUS 3 MG PO TABS: 3.0000 mg | ORAL_TABLET | Freq: Every day | ORAL | 0 refills | Status: DC

## 2021-04-07 MED ORDER — METFORMIN HCL 500 MG PO TABS: 500.0000 mg | ORAL_TABLET | Freq: Two times a day (BID) | ORAL | 0 refills | Status: DC

## 2021-04-08 DIAGNOSIS — M84374A Stress fracture, right foot, initial encounter for fracture: Secondary | ICD-10-CM | POA: Insufficient documentation

## 2021-04-08 NOTE — Progress Notes (Signed)
Chief Complaint:   OBESITY Robert Stokes is here to discuss his progress with his obesity treatment plan along with follow-up of his obesity related diagnoses. Luther is on keeping a food journal and adhering to recommended goals of 2000 calories and 120+ grams of protein daily and states he is following his eating plan approximately (unknown)% of the time. Breydon states he is walking for 30+ minutes 7 times per week.  Today's visit was #: 10 Starting weight: 412 lbs Starting date: 08/21/2020 Today's weight: 411 lbs Today's date: 04/07/2021 Total lbs lost to date: 1 Total lbs lost since last in-office visit: 0  Interim History: [name] has done well with avoiding holiday weight gain. He has not been sticking with journaling or the Category 4 plan, but he has been mindful. He feels he does well with portion control and making smarter choices, but he sometimes misses meals.  Subjective:   1. Pre-diabetes Robert Stokes is on metformin, but he is still struggling with diet and exercise. He is open to looking at other medications, but he wants to avoid anything injectable.   Assessment/Plan:   1. Pre-diabetes [name] agreed to start Rybelsus 3 mg q AM with no refills, and we will refill for 1 month. He will continue to work on weight loss, exercise, and decreasing simple carbohydrates to help decrease the risk of diabetes.   - metFORMIN (GLUCOPHAGE) 500 MG tablet; Take 1 tablet (500 mg total) by mouth 2 (two) times daily with a meal.  Dispense: 60 tablet; Refill: 0 - Semaglutide (RYBELSUS) 3 MG TABS; Take 3 mg by mouth daily.  Dispense: 30 tablet; Refill: 0  2. Obesity with current BMI of 57.4 [name] is currently in the action stage of change. As such, his goal is to continue with weight loss efforts. He has agreed to the Category 4 Plan or keeping a food journal and adhering to recommended goals of 2000 calories and 120 grams of protein daily.   Exercise goals: As is.  Behavioral modification  strategies: no skipping meals and keeping a strict food journal.  [name] has agreed to follow-up with our clinic in 4 weeks. He was informed of the importance of frequent follow-up visits to maximize his success with intensive lifestyle modifications for his multiple health conditions.   Objective:   Blood pressure 131/81, pulse 66, temperature 98 F (36.7 C), temperature source Oral, height 5\' 11"  (1.803 m), weight (!) 411 lb (186.4 kg), SpO2 94 %. Body mass index is 57.32 kg/m.  General: Cooperative, alert, well developed, in no acute distress. HEENT: Conjunctivae and lids unremarkable. Cardiovascular: Regular rhythm.  Lungs: Normal work of breathing. Neurologic: No focal deficits.   Lab Results  Component Value Date   CREATININE 0.76 09/01/2020   BUN 15 09/01/2020   NA 139 09/01/2020   K 3.8 09/01/2020   CL 102 09/01/2020   CO2 27 09/01/2020   Lab Results  Component Value Date   ALT 29 08/21/2020   AST 18 08/21/2020   ALKPHOS 109 08/21/2020   BILITOT 0.4 08/21/2020   Lab Results  Component Value Date   HGBA1C 5.0 08/21/2020   Lab Results  Component Value Date   INSULIN 23.4 08/21/2020   Lab Results  Component Value Date   TSH 2.870 08/21/2020   Lab Results  Component Value Date   CHOL 184 08/21/2020   HDL 39 (L) 08/21/2020   LDLCALC 126 (H) 08/21/2020   TRIG 104 08/21/2020   Lab Results  Component Value Date  VD25OH 12.2 (L) 08/21/2020   Lab Results  Component Value Date   WBC 9.2 09/01/2020   HGB 14.3 09/01/2020   HCT 45.7 09/01/2020   MCV 88.2 09/01/2020   PLT 246 09/01/2020   No results found for: IRON, TIBC, FERRITIN  Attestation Statements:   Reviewed by clinician on day of visit: allergies, medications, problem list, medical history, surgical history, family history, social history, and previous encounter notes.  Time spent on visit including pre-visit chart review and post-visit care and charting was 30 minutes.    I, Burt Knack,  am acting as transcriptionist for Quillian Quince, MD.  I have reviewed the above documentation for accuracy and completeness, and I agree with the above. -  Quillian Quince, MD

## 2021-04-29 ENCOUNTER — Other Ambulatory Visit: Payer: Self-pay

## 2021-04-29 ENCOUNTER — Ambulatory Visit (INDEPENDENT_AMBULATORY_CARE_PROVIDER_SITE_OTHER): Payer: Medicare Other | Admitting: Podiatry

## 2021-04-29 ENCOUNTER — Ambulatory Visit (INDEPENDENT_AMBULATORY_CARE_PROVIDER_SITE_OTHER): Payer: Medicare Other

## 2021-04-29 DIAGNOSIS — M8430XA Stress fracture, unspecified site, initial encounter for fracture: Secondary | ICD-10-CM | POA: Diagnosis not present

## 2021-04-29 DIAGNOSIS — M79671 Pain in right foot: Secondary | ICD-10-CM

## 2021-04-29 DIAGNOSIS — G8929 Other chronic pain: Secondary | ICD-10-CM | POA: Diagnosis not present

## 2021-04-30 NOTE — Progress Notes (Signed)
°  Subjective:  Patient ID: GRISELDA TOSH, male    DOB: January 17, 1991,  MRN: [id number]  Chief Complaint  Patient presents with   Follow-up     Stress reaction of bone  2. Chronic foot pain, right  3. Tendonitis of ankle or foot     31 y.o. male presents with the above complaint.  Pain continues to be quite severe complains of pain rated 12 out of 10.  States that he never heard from physical therapy to schedule an appointment Objective:  Physical Exam: warm, good capillary refill, no trophic changes or ulcerative lesions, normal DP and PT pulses and normal sensory exam. Right Foot: POP  4th/5th metatarsal area with maximal pain at the fifth metatarsal base  MRI 12/3 Study Result  Narrative & Impression  CLINICAL DATA:  Chronic right foot pain, weakness, swelling, and numbness.   EXAM: MRI OF THE RIGHT FOREFOOT WITHOUT CONTRAST; MRI OF THE RIGHT ANKLE WITHOUT CONTRAST   TECHNIQUE: Multiplanar, multisequence MR imaging of the right foot and ankle was performed. No intravenous contrast was administered.   COMPARISON:  CT right foot and ankle dated January 17, 2021. MRI right foot dated November 01, 2020.   FINDINGS: TENDONS   Peroneal: Peroneal longus tendon intact. Peroneal brevis intact.   Posteromedial: Posterior tibial tendon intact. Flexor digitorum longus tendon intact. Flexor hallucis longus tendon intact.   Anterior: Tibialis anterior tendon intact. Extensor hallucis longus tendon intact Extensor digitorum longus tendon intact.   Achilles:  Intact.   Plantar Fascia: Intact.   LIGAMENTS   Lateral: Anterior talofibular ligament intact. Calcaneofibular ligament intact. Posterior talofibular ligament intact. Anterior and posterior tibiofibular ligaments intact.   Medial: Deltoid ligament intact. Spring ligament intact.   Toe collateral ligaments are intact.   CARTILAGE   Ankle Joint: No joint effusion. Normal ankle mortise. No chondral defect.   Subtalar  Joints/Sinus Tarsi: Normal subtalar joints. No subtalar joint effusion. Normal sinus tarsi.   Bones: There is new mild marrow edema at the base of the third and fourth metatarsals. No fracture or dislocation.   Soft Tissue: No soft tissue mass or fluid collection. No muscle edema or atrophy.   IMPRESSION: 1. New mild marrow edema at the base of the third and fourth metatarsals, which may be stress related. No fracture. 2. Normal MRI of the right ankle.    Assessment:   1. Stress reaction of bone   2. Chronic foot pain, right     Plan:  Patient was evaluated and treated and all questions answered.  Stress reaction right foot -X-rays taken again no interval changes still no evidence of fracture of the bone. -He has failed numerous therapies to attempt to alleviate his pain including aggressive offloading and cam boot, previous cast, anti-inflammatory medications without relief. At this point I think he would benefit from total contact cast to try to fully offload the foot and prevent further stress reaction.  Unfortunately we did not have the supplies to be performed today.  Patient will come back next week for application of total contact cast. -Hold off PT as he will be soon getting into a cast.  I did check confirmed that they had tried to call him previously but an appointment was never made.  No follow-ups on file.

## 2021-05-06 ENCOUNTER — Other Ambulatory Visit: Payer: Medicare Other

## 2021-05-07 ENCOUNTER — Encounter (INDEPENDENT_AMBULATORY_CARE_PROVIDER_SITE_OTHER): Payer: Self-pay | Admitting: Family Medicine

## 2021-05-07 ENCOUNTER — Ambulatory Visit (INDEPENDENT_AMBULATORY_CARE_PROVIDER_SITE_OTHER): Payer: Medicare Other | Admitting: Family Medicine

## 2021-05-07 ENCOUNTER — Other Ambulatory Visit: Payer: Self-pay

## 2021-05-07 ENCOUNTER — Other Ambulatory Visit (INDEPENDENT_AMBULATORY_CARE_PROVIDER_SITE_OTHER): Payer: Self-pay | Admitting: Family Medicine

## 2021-05-07 VITALS — BP 131/76 | HR 71 | Temp 97.5°F | Ht 71.0 in | Wt >= 6400 oz

## 2021-05-07 DIAGNOSIS — Z6841 Body Mass Index (BMI) 40.0 and over, adult: Secondary | ICD-10-CM

## 2021-05-07 DIAGNOSIS — E669 Obesity, unspecified: Secondary | ICD-10-CM

## 2021-05-07 DIAGNOSIS — R7303 Prediabetes: Secondary | ICD-10-CM | POA: Diagnosis not present

## 2021-05-07 DIAGNOSIS — F3289 Other specified depressive episodes: Secondary | ICD-10-CM

## 2021-05-07 MED ORDER — BUPROPION HCL ER (SR) 150 MG PO TB12: 150.0000 mg | ORAL_TABLET | Freq: Every day | ORAL | 0 refills | Status: DC

## 2021-05-07 MED ORDER — METFORMIN HCL 500 MG PO TABS: 500.0000 mg | ORAL_TABLET | Freq: Two times a day (BID) | ORAL | 0 refills | Status: DC

## 2021-05-08 NOTE — Progress Notes (Signed)
Chief Complaint:   OBESITY Robert Stokes is here to discuss his progress with his obesity treatment plan along with follow-up of his obesity related diagnoses. Robert Stokes is on the Category 4 Plan or keeping a food journal and adhering to recommended goals of 2000 calories and 120 grams of protein daily and states he is following his eating plan approximately 70% of the time. Robert Stokes states he is doing 0 minutes 0 times per week.  Today's visit was #: 11 Starting weight: 412 lbs Starting date: 08/21/2020 Today's weight: 411 lbs Today's date: 05/07/2021 Total lbs lost to date: 1 Total lbs lost since last in-office visit: 0  Interim History: Robert Stokes has maintained his weight since his last visit. He is struggling with getting enough calories. He is forcing himself to eat. He is not exercising due to foot pain.  Subjective:   1. Pre-diabetes Robert Stokes hasn't started Rybelsus 3 mg that was prescribed after his last visit due to cost. He is taking metformin 500 mg BID and he denies side effects.  2. Other depression with emotional eating Robert Stokes is struggling with meal planning and prepping, and emotional eating behaviors. He is open to looking at medication options.  Assessment/Plan:   1. Pre-diabetes We will refill metformin for 1 month. [place]ell will continue working on journaling and weight loss to help decrease the risk of diabetes.   - metFORMIN (GLUCOPHAGE) 500 MG tablet; Take 1 tablet (500 mg total) by mouth 2 (two) times daily with a meal.  Dispense: 60 tablet; Refill: 0  2. Other depression with emotional eating Robert Stokes agreed to start Wellbutrin SR 150 mg q AM with no refills. Side effects were discussed. Behavior modification techniques were discussed today to help Robert Stokes deal with his emotional/non-hunger eating behaviors. Orders and follow up as documented in patient record.   - buPROPion (WELLBUTRIN SR) 150 MG 12 hr tablet; Take 1 tablet (150 mg total) by mouth daily.  Dispense: 30  tablet; Refill: 0  3. Obesity with current BMI of 57.4 Robert Stokes is currently in the action stage of change. As such, his goal is to continue with weight loss efforts. He has agreed to keeping a food journal and adhering to recommended goals of 2000 calories and 120+ grams of protein daily.   Behavioral modification strategies: increasing lean protein intake, decreasing simple carbohydrates, increasing vegetables, increasing water intake, no skipping meals, and meal planning and cooking strategies.  Robert Stokes has agreed to follow-up with our clinic in 3 weeks. He was informed of the importance of frequent follow-up visits to maximize his success with intensive lifestyle modifications for his multiple health conditions.   Objective:   Blood pressure 131/76, pulse 71, temperature (!) 97.5 F (36.4 C), height 5\' 11"  (1.803 m), weight (!) 411 lb (186.4 kg), SpO2 97 %. Body mass index is 57.32 kg/m.  General: Cooperative, alert, well developed, in no acute distress. HEENT: Conjunctivae and lids unremarkable. Cardiovascular: Regular rhythm.  Lungs: Normal work of breathing. Neurologic: No focal deficits.   Lab Results  Component Value Date   CREATININE 0.76 09/01/2020   BUN 15 09/01/2020   NA 139 09/01/2020   K 3.8 09/01/2020   CL 102 09/01/2020   CO2 27 09/01/2020   Lab Results  Component Value Date   ALT 29 08/21/2020   AST 18 08/21/2020   ALKPHOS 109 08/21/2020   BILITOT 0.4 08/21/2020   Lab Results  Component Value Date   HGBA1C 5.0 08/21/2020   Lab Results  Component Value  Date   INSULIN 23.4 08/21/2020   Lab Results  Component Value Date   TSH 2.870 08/21/2020   Lab Results  Component Value Date   CHOL 184 08/21/2020   HDL 39 (L) 08/21/2020   LDLCALC 126 (H) 08/21/2020   TRIG 104 08/21/2020   Lab Results  Component Value Date   VD25OH 12.2 (L) 08/21/2020   Lab Results  Component Value Date   WBC 9.2 09/01/2020   HGB 14.3 09/01/2020   HCT 45.7 09/01/2020    MCV 88.2 09/01/2020   PLT 246 09/01/2020   No results found for: IRON, TIBC, FERRITIN  Attestation Statements:   Reviewed by clinician on day of visit: allergies, medications, problem list, medical history, surgical history, family history, social history, and previous encounter notes.   I, Burt Knack, am acting as transcriptionist for Quillian Quince, MD.  I have reviewed the above documentation for accuracy and completeness, and I agree with the above. -  Quillian Quince, MD

## 2021-05-26 ENCOUNTER — Encounter: Payer: Self-pay | Admitting: Neurology

## 2021-05-26 ENCOUNTER — Ambulatory Visit (INDEPENDENT_AMBULATORY_CARE_PROVIDER_SITE_OTHER): Payer: Medicare Other | Admitting: Neurology

## 2021-05-26 VITALS — BP 164/93 | HR 72 | Ht 71.0 in | Wt >= 6400 oz

## 2021-05-26 DIAGNOSIS — E662 Morbid (severe) obesity with alveolar hypoventilation: Secondary | ICD-10-CM

## 2021-05-26 DIAGNOSIS — G4733 Obstructive sleep apnea (adult) (pediatric): Secondary | ICD-10-CM | POA: Diagnosis not present

## 2021-05-26 DIAGNOSIS — R0601 Orthopnea: Secondary | ICD-10-CM

## 2021-05-26 DIAGNOSIS — G4736 Sleep related hypoventilation in conditions classified elsewhere: Secondary | ICD-10-CM

## 2021-05-26 DIAGNOSIS — G4734 Idiopathic sleep related nonobstructive alveolar hypoventilation: Secondary | ICD-10-CM | POA: Diagnosis not present

## 2021-05-26 DIAGNOSIS — G4719 Other hypersomnia: Secondary | ICD-10-CM

## 2021-05-26 DIAGNOSIS — E669 Obesity, unspecified: Secondary | ICD-10-CM | POA: Diagnosis not present

## 2021-05-26 NOTE — Progress Notes (Signed)
SLEEP MEDICINE CLINIC    Provider:  Larey Seat, MD  Primary Care Physician:  Pcp, No No address on file     Referring Provider: Abby Potash, Pa-c [address] [place],  [place] [address]          Chief Complaint according to patient   Patient presents with:     New Patient (Initial Visit)     Initial CPAP visit.  Here for initial CPAP f/u. Pt has not been using his CPAP since 03/16/21 due to mask straps not staying clipped. DME will be sending new supply today per pt. DME: [place] set up on 02/25/21 with a Luna CPAP.       HISTORY OF PRESENT ILLNESS:  Robert Stokes is a 31 y.o. male patient and seen here  in a RV  on 05/26/2021 in a RV: CD I have the pleasure of meeting today with Robert Stokes on 26 May 2021.  The patient underwent last summer a sleep study results which confirmed the presence of sleep apnea .  Split-night study followed his abnormal baseline study which has also shown low had low oxygen levels.  He also needed to have 2 pillows or to be a slight bit elevated to sleep comfortably and he was known to snore very loudly.  We invited him therefore back for a split-night study as he is at high risk of central apneas.  BMI was 57.1, neck circumference 22 inches.  And without CPAP his baseline was at an AHI of 20.3 in the lab RDI 23.3, rest REM sleep AHI was 75/h this and the nadir of 59% oxygenation was 142 minutes below normal oxygenation with the risk factors for sleep apnea that made it necessary to use positive airway pressure.  The patient was doing well at 12 cm water pressure with a reduction of the AHI of 0.0 but no REM sleep was observed.  REM sleep had occurred at 8 and 10 cm water pressure.  He was given an F30 I medium sized facial fullface mask here in the lab.  And I had ordered an ONO while on CPAP. He reports no ONO took place yet-   it took a while for him to be given access to his CPAP machine and he was set up on 02-25-2021 with a  lunar machine.  The patient has used the machine over the last 30 days 20 out of 30 days percent compliance of 66.7%, the days with usage over 4 hours of 43.3% 13 out of 30 days.  The average leak is 13.4 L/min.  However his average AHI is 0.7/h and that seems to be indicating that the machine is working for him when used.  Leakage was really at peak x3 out of 30 days and he may have had dislodged the face from his mom mask.  The 95th percentile pressure is around 10 cm of water, Epworth sleepiness score was today endorsed at 9 fatigue severity at 45 points.        CONSULT from -[facility] [place]. He is here for sleep consultation.  Chief concern according to patient : At the pleasure of meeting with Robert Stokes today, he was diagnosed with sleep apnea about 12 years ago maybe even longer but he has not been given CPAP.  He had no follow up- this was in [place]. He often goes to bed very late between 3 and 5 AM but then  wakes around 6 AM and feels fully charged and energized.  He feels groggy later in the day.  He has been told by roommates that he snores and he may suffer from obesity hypoventilation given his BMI is 56.  He does have a recent acquired hairline fracture of the cuboid bone of the right foot which is painful.  This does not affect his sleep however.   He  has a past medical history of ADD (attention deficit disorder), ADHD, Asthma, Back pain, Joint pain, Super-Obesity, Sleep apnea dx but no treatment, SOB (shortness of breath), and Swelling of lower extremity.    Sleep relevant medical history: Nocturia rarely- snoring, yes, dry mouth all day. Headaches in AM, , Cluster headache- stabbing  feeling  headaches waking him at night.   Had adenoid and Tonsillectomy at age 59-12, was already snoring.     Family medical /sleep history: no  other family member was diagnosed  with OSA, but insomnia,   He states that his mother and several uncles etc. have all  had trouble with insomnia but is getting restful sleep there has been a high prevalence of diabetes. Mother died of cancer at age 50- chemotherapy- Liver metastasis, DM, CAD, HTN.   Social history:  Patient is working as Engineer, civil (consulting). Finished 6-12 grade in [place].   He lives in a household with  2 room mates. Family status is single . The patient currently is out on medical leave- works/ used to work in shifts( night/ rotating,) Tobacco use: none.  ETOH use 2 beers every 6 month ,  Caffeine ; none Regular exercise- none .   Hobbies :video games , and cooking.       Sleep habits are as follows: The patient's dinner time is between 5.30-7 PM. He skips meals.  The patient goes to bed at  2.30 AM and continues to sleep for only 1-2  hours, wakes form anxiety, palpitations, tremor, and tension. GERD. He insisted that he sleeps only a few hours a day- He never feels really refreshed and restored in the morning, if he can sleep longer it is and intervals of sometimes only 30 minutes interrupted by periods of wakefulness. He prefers to sleep on his side on 3 pillows for support. Dreams are reportedly rare. 5  AM was the usual rise time during his work time.  He would come home by around 330 and will nap for another 2 or 3 hours.  So this would give him about 6 hours of sleep total.. The patient wakes up spontaneously H reports not feeling refreshed or restored in AM, with symptoms such as dry mouth, sleep cluster and morning headaches, and residual fatigue.  He describes a cluster headache with a sharp stabbing sensation behind behind the right eye.  Penetrating from the temple. Naps are taken infrequently, lasting from 30 minutes to 120 minutes. He has not avoided naps when working, now he does. .    Review of Systems: Out of a complete 14 system review, the patient complains of only the following symptoms, and all other reviewed systems are negative.:  Fatigue, sleepiness , snoring,  fragmented sleep, Insomnia.  Short sleeper, orthopnea.   He is currently enrolled in a healthy weight and wellness study Ms. Anders Grant hoping to lose enough weight to be safe for a bariatric surgery.     How likely are you to doze in the following situations: 0 = not likely, 1 = slight chance, 2 = moderate chance, 3 =  high chance   Sitting and Reading? Watching Television? Sitting inactive in a public place (theater or meeting)? As a passenger in a car for an hour without a break? Lying down in the afternoon when circumstances permit? Sitting and talking to someone? Sitting quietly after lunch without alcohol? In a car, while stopped for a few minutes in traffic?   Total = 9 from 13 pre CPAP. / 24 points  yawning.  FSS endorsed at 45 from 55/ 63 points.   Social History   Socioeconomic History   Marital status: Single    Spouse name: Not on file   Number of children: Not on file   Years of education: Not on file   Highest education level: 12th grade  Occupational History   Occupation: DISH WASHER/ STUDENT  Tobacco Use   Smoking status: Never   Smokeless tobacco: Never  Vaping Use   Vaping Use: Never used  Substance and Sexual Activity   Alcohol use: Never   Drug use: No   Sexual activity: Not Currently  Other Topics Concern   Not on file  Social History Narrative   Live with 2 roommates   Right handed   No caffeine    Social Determinants of Health   Financial Resource Strain: Not on file  Food Insecurity: Not on file  Transportation Needs: Not on file  Physical Activity: Not on file  Stress: Not on file  Social Connections: Not on file    Family History  Problem Relation Age of Onset   Diabetes Mother    Hypertension Mother    Heart failure Mother    Cancer Mother    Depression Mother    Anxiety disorder Mother    Liver cancer Mother    Sleep apnea Mother    Alcoholism Mother    Drug abuse Mother    Cancer Father    Depression Father     Anxiety disorder Father    Sleep apnea Father    Alcoholism Father    Drug abuse Father     Past Medical History:  Diagnosis Date   ADD (attention deficit disorder)    ADHD    Asthma    Back pain    Joint pain    Obesity    Sleep apnea    SOB (shortness of breath)    Swelling of lower extremity     Past Surgical History:  Procedure Laterality Date   ADENOIDECTOMY     TONSILLECTOMY       Current Outpatient Medications on File Prior to Visit  Medication Sig Dispense Refill   buPROPion (WELLBUTRIN SR) 150 MG 12 hr tablet Take 1 tablet (150 mg total) by mouth daily. 30 tablet 0   diclofenac (VOLTAREN) 50 MG EC tablet Take 50 mg by mouth daily.     meloxicam (MOBIC) 15 MG tablet Take 1 tablet (15 mg total) by mouth daily. Take 1 daily with food. 10 tablet 0   metFORMIN (GLUCOPHAGE) 500 MG tablet Take 1 tablet (500 mg total) by mouth 2 (two) times daily with a meal. 60 tablet 0   methylPREDNISolone (MEDROL DOSEPAK) 4 MG TBPK tablet 6 Day Taper Pack. Take as Directed. 21 tablet 0   Semaglutide (RYBELSUS) 3 MG TABS Take 3 mg by mouth daily. 30 tablet 0   triamcinolone (NASACORT) 55 MCG/ACT AERO nasal inhaler Place 2 sprays into the nose daily. 1 each 12   No current facility-administered medications on file prior to visit.    No  Known Allergies  Physical exam:  Today's Vitals   05/26/21 1440  BP: (!) 164/93  Pulse: 72  Weight: (!) 411 lb (186.4 kg)  Height: 5\' 11"  (1.803 m)   Body mass index is 57.32 kg/m.   Wt Readings from Last 3 Encounters:  05/26/21 (!) 411 lb (186.4 kg)  05/07/21 (!) 411 lb (186.4 kg)  04/07/21 (!) 411 lb (186.4 kg)     Ht Readings from Last 3 Encounters:  05/26/21 5\' 11"  (1.803 m)  05/07/21 5\' 11"  (1.803 m)  04/07/21 5\' 11"  (1.803 m)      General: The patient is awake, alert and appears not in acute distress. The patient is cooperative.  Head: Normocephalic, atraumatic. Neck is supple. Mallampati 3, small oral opening, irregular  dentition- crowded. ,  neck circumference:22 inches . Nasal airflow not patent.  Retrognathia is not seen.  Dental status: see above.  Cardiovascular:  Regular rate and cardiac rhythm by pulse,  without distended neck veins. Respiratory: Lungs are clear to auscultation.  Skin:  Without evidence of ankle edema, or rash. Trunk: The patient's posture is erect.   Neurologic exam : The patient is awake and alert, oriented to place and time.   Memory subjective described as intact.  Attention span & concentration ability appears normal.  Speech is fluent,  without  dysarthria, nasal speech / dysphonia or aphasia.  Mood and affect are appropriate.   Cranial nerves: no loss of smell or taste reported  Pupils are equal and briskly reactive to light. Funduscopic exam deferred. .  Extraocular movements in vertical and horizontal planes were intact and without nystagmus. No Diplopia. Visual fields by finger perimetry are intact. Hearing was intact to soft voice and finger rubbing.    Facial motor strength is symmetric and tongue and uvula move midline.  Neck ROM : rotation, tilt and flexion extension were normal for age and shoulder shrug was symmetrical.    Motor exam:  deferred.  Sensory:  " Proprioception tested in the upper extremities was normal.   Deep tendon reflexes: in the  upper and lower extremities are  trace only.       After spending a total time of  20  minutes face to face and additional time for physical and neurologic examination, review of laboratory studies,  personal review of imaging studies, reports and results of other testing and review of referral information / records as far as provided in visit, I have established the following assessments:  1) Robert Stokes reports that he seems overall a little bit less sleepy but this can vary day by day depending on how many hours of sleep he got or how well he slept.  He remains orthopneic, and his CPAP has been used not as  frequently as I wished, the overall 4-hour use at night which she should average is only present for 43.3% compliance 30 out of 30 days.  He has used the machine 20 out of 30 days.  He reports that he is bothered by air leakage and also that the machine requires quite a bit of distilled water.     Yet overall Epworth score and fatigue severity scale have both decreased.   CLUSTER headaches have resolved since CPAP is used !    My Plan is to proceed with:  1) severe hypoxia and apnea - improving compliance on CPAP. Use 4 hours or more every night! Shave  to allow best air seal.   I do wonder if the fullface  mask is not sealing very well. He reports waking up when the mask is next to him.  2) orthopnea :you can sleep in a recliner or with a wedge , your choice. 3) we still need an ONO on CPAP. 4) your main problem is Obesity, continue to follow  weight & wellness provider Dr Leafy Ro for weight loss.    I would like to thank Robert Stokes, [address] [place],  [place] [address] for allowing me to meet with and to take care of this pleasant patient.   In short, Robert Stokes is presenting with symptoms that can be attributed to [place], and manifesting as cluster headaches, morning headaches and apnea .   We plan to follow up through our NP after ONO within 6 months.     Electronically signed by: Larey Seat, MD 05/26/2021 3:25 PM  [facility] Board certified by The [employer] of Sleep Medicine and Diplomate of the [employer] of Sleep Medicine. Board certified In Neurology through the [place], Fellow of the [employer] of Neurology. Medical Director of [facility].

## 2021-05-27 NOTE — Progress Notes (Signed)
CM sent to [facility] for new order ?

## 2021-05-30 ENCOUNTER — Other Ambulatory Visit: Payer: Medicare Other

## 2021-06-04 ENCOUNTER — Encounter: Payer: Self-pay | Admitting: Neurology

## 2021-06-05 ENCOUNTER — Other Ambulatory Visit: Payer: Self-pay

## 2021-06-05 ENCOUNTER — Ambulatory Visit (INDEPENDENT_AMBULATORY_CARE_PROVIDER_SITE_OTHER): Payer: Medicare Other | Admitting: Family Medicine

## 2021-06-05 ENCOUNTER — Encounter (INDEPENDENT_AMBULATORY_CARE_PROVIDER_SITE_OTHER): Payer: Self-pay | Admitting: Family Medicine

## 2021-06-05 VITALS — BP 132/77 | HR 87 | Temp 98.0°F | Ht 71.0 in | Wt >= 6400 oz

## 2021-06-05 DIAGNOSIS — Z6841 Body Mass Index (BMI) 40.0 and over, adult: Secondary | ICD-10-CM

## 2021-06-05 DIAGNOSIS — E669 Obesity, unspecified: Secondary | ICD-10-CM | POA: Diagnosis not present

## 2021-06-05 DIAGNOSIS — R7303 Prediabetes: Secondary | ICD-10-CM

## 2021-06-05 DIAGNOSIS — F3289 Other specified depressive episodes: Secondary | ICD-10-CM | POA: Diagnosis not present

## 2021-06-05 MED ORDER — BUPROPION HCL ER (SR) 150 MG PO TB12: 150.0000 mg | ORAL_TABLET | Freq: Every day | ORAL | 0 refills | Status: DC

## 2021-06-09 ENCOUNTER — Other Ambulatory Visit (INDEPENDENT_AMBULATORY_CARE_PROVIDER_SITE_OTHER): Payer: Self-pay | Admitting: Family Medicine

## 2021-06-09 DIAGNOSIS — F3289 Other specified depressive episodes: Secondary | ICD-10-CM

## 2021-06-09 NOTE — Progress Notes (Signed)
? ? ? ?Chief Complaint:  ? ?OBESITY ?Robert Stokes is here to discuss his progress with his obesity treatment plan along with follow-up of his obesity related diagnoses. Robert Stokes is on keeping a food journal and adhering to recommended goals of 2000 calories and 120+ grams of protein daily and states he is following his eating plan approximately 70% of the time. Robert Stokes states he is doing 0 minutes 0 times per week. ? ?Today's visit was #: 12 ?Starting weight: 412 lbs ?Starting date: 08/21/2020 ?Today's weight: 412 lbs ?Today's date: 06/05/2021 ?Total lbs lost to date: 0 ?Total lbs lost since last in-office visit: 0 ? ?Interim History: Robert Stokes states he is not hungry and only eating 1-2 meals per day. He is keeping a journal, but he is not meeting his calories and protein goals.  ? ?Subjective:  ? ?1. Pre-diabetes ?Robert Stokes notes decreased hunger to level where he is likely dropping his RMR. ? ?2. Other depression with emotional eating ?Robert Stokes is stable on his medications. He has no problems with emotional eating behaviors. His blood pressure is stable. ? ?Assessment/Plan:  ? ?1. Pre-diabetes ?Robert Stokes agreed to discontinue metformin and Rybelsus. He will continue to work on his diet and exercise to help decrease the risk of diabetes. We will continue to follow. ? ?2. Other depression with emotional eating ?We will refill bupropion for 1 month. Behavior modification techniques were discussed today to help Robert Stokes deal with his emotional/non-hunger eating behaviors. Orders and follow up as documented in patient record.  ? ?- buPROPion (WELLBUTRIN SR) 150 MG 12 hr tablet; Take 1 tablet (150 mg total) by mouth daily.  Dispense: 30 tablet; Refill: 0 ? ?3. Obesity with current BMI of 57.5 ?Robert Stokes is currently in the action stage of change. As such, his goal is to continue with weight loss efforts. He has agreed to change to keeping a food journal and adhering to recommended goals of 1500-1800 calories and 120 grams of protein daily.   ? ?We will recheck fasting IC at his next visit. ? ?Behavioral modification strategies: increasing lean protein intake and no skipping meals. ? ?Robert Stokes has agreed to follow-up with our clinic in 4 weeks. He was informed of the importance of frequent follow-up visits to maximize his success with intensive lifestyle modifications for his multiple health conditions.  ? ?Objective:  ? ?Blood pressure 132/77, pulse 87, temperature 98 ?F (36.7 ?C), height 5\' 11"  (1.803 m), weight (!) 412 lb (186.9 kg), SpO2 95 %. ?Body mass index is 57.46 kg/m?. ? ?General: Cooperative, alert, well developed, in no acute distress. ?HEENT: Conjunctivae and lids unremarkable. ?Cardiovascular: Regular rhythm.  ?Lungs: Normal work of breathing. ?Neurologic: No focal deficits.  ? ?Lab Results  ?Component Value Date  ? CREATININE 0.76 09/01/2020  ? BUN 15 09/01/2020  ? NA 139 09/01/2020  ? K 3.8 09/01/2020  ? CL 102 09/01/2020  ? CO2 27 09/01/2020  ? ?Lab Results  ?Component Value Date  ? ALT 29 08/21/2020  ? AST 18 08/21/2020  ? ALKPHOS 109 08/21/2020  ? BILITOT 0.4 08/21/2020  ? ?Lab Results  ?Component Value Date  ? HGBA1C 5.0 08/21/2020  ? ?Lab Results  ?Component Value Date  ? INSULIN 23.4 08/21/2020  ? ?Lab Results  ?Component Value Date  ? TSH 2.870 08/21/2020  ? ?Lab Results  ?Component Value Date  ? CHOL 184 08/21/2020  ? HDL 39 (L) 08/21/2020  ? LDLCALC 126 (H) 08/21/2020  ? TRIG 104 08/21/2020  ? ?Lab Results  ?Component Value Date  ?  VD25OH 12.2 (L) 08/21/2020  ? ?Lab Results  ?Component Value Date  ? WBC 9.2 09/01/2020  ? HGB 14.3 09/01/2020  ? HCT 45.7 09/01/2020  ? MCV 88.2 09/01/2020  ? PLT 246 09/01/2020  ? ?No results found for: IRON, TIBC, FERRITIN ? ?Obesity Behavioral Intervention:  ? ?Approximately 15 minutes were spent on the discussion below. ? ?ASK: ?We discussed the diagnosis of obesity with Robert Stokes today and Robert Stokes agreed to give [name] permission to discuss obesity behavioral modification therapy  today. ? ?ASSESS: ?Robert Stokes has the diagnosis of obesity and his BMI today is 57.5. Robert Stokes is in the action stage of change.  ? ?ADVISE: ?Robert Stokes was educated on the multiple health risks of obesity as well as the benefit of weight loss to improve his health. He was advised of the need for long term treatment and the importance of lifestyle modifications to improve his current health and to decrease his risk of future health problems. ? ?AGREE: ?Multiple dietary modification options and treatment options were discussed and Robert Stokes agreed to follow the recommendations documented in the above note. ? ?ARRANGE: ?Robert Stokes was educated on the importance of frequent visits to treat obesity as outlined per CMS and USPSTF guidelines and agreed to schedule his next follow up appointment today. ? ?Attestation Statements:  ? ?Reviewed by clinician on day of visit: allergies, medications, problem list, medical history, surgical history, family history, social history, and previous encounter notes. ? ? ?I, Trixie Dredge, am acting as transcriptionist for Dennard Nip, MD. ? ?I have reviewed the above documentation for accuracy and completeness, and I agree with the above. -  Dennard Nip, MD ? ? ?

## 2021-06-11 ENCOUNTER — Telehealth: Payer: Self-pay | Admitting: Neurology

## 2021-06-11 NOTE — Telephone Encounter (Signed)
Pt completed the ONO on CPAP. There was a total recording time of 8 hr and 13 min. During the study there was a recording time of 4 hr 55 min of time spent below 89%.  ?The lowest appeared to have dropped was 40 %.  ?I will have Dr Dohmeier review the report and see what her thoughts are on these findings and if she has any recommendations. ?

## 2021-06-12 ENCOUNTER — Encounter: Payer: Self-pay | Admitting: Neurology

## 2021-06-12 NOTE — Telephone Encounter (Signed)
Dr Dohmeier reviewed the download and the patient does need oxygen. Pt would best be treated and evaluated by a pulmonologist to assist with treating the oxygen level dropping. I will discuss with the patient and see if he has a pulmonologist and if not will send a referral for pulmonology ?

## 2021-06-16 ENCOUNTER — Other Ambulatory Visit: Payer: Self-pay | Admitting: Neurology

## 2021-06-16 DIAGNOSIS — E669 Obesity, unspecified: Secondary | ICD-10-CM

## 2021-06-16 DIAGNOSIS — G4719 Other hypersomnia: Secondary | ICD-10-CM

## 2021-06-16 DIAGNOSIS — R519 Headache, unspecified: Secondary | ICD-10-CM

## 2021-06-16 DIAGNOSIS — R0902 Hypoxemia: Secondary | ICD-10-CM

## 2021-07-02 ENCOUNTER — Emergency Department (HOSPITAL_COMMUNITY): Payer: Medicare Other

## 2021-07-02 ENCOUNTER — Encounter (HOSPITAL_COMMUNITY): Payer: Self-pay

## 2021-07-02 ENCOUNTER — Emergency Department (HOSPITAL_COMMUNITY)
Admission: EM | Admit: 2021-07-02 | Discharge: 2021-07-02 | Disposition: A | Discharge status: Home / Self Care | Payer: Medicare Other | Attending: Emergency Medicine | Admitting: Emergency Medicine

## 2021-07-02 ENCOUNTER — Other Ambulatory Visit: Payer: Self-pay

## 2021-07-02 DIAGNOSIS — J302 Other seasonal allergic rhinitis: Secondary | ICD-10-CM

## 2021-07-02 DIAGNOSIS — R0602 Shortness of breath: Secondary | ICD-10-CM | POA: Diagnosis present

## 2021-07-02 DIAGNOSIS — F410 Panic disorder [episodic paroxysmal anxiety] without agoraphobia: Secondary | ICD-10-CM

## 2021-07-02 DIAGNOSIS — J45909 Unspecified asthma, uncomplicated: Secondary | ICD-10-CM | POA: Insufficient documentation

## 2021-07-02 LAB — CBC WITH DIFFERENTIAL/PLATELET
Abs Immature Granulocytes: 0.02 10*3/uL (ref 0.00–0.07)
Basophils Absolute: 0 10*3/uL (ref 0.0–0.1)
Basophils Relative: 0 %
Eosinophils Absolute: 0.3 10*3/uL (ref 0.0–0.5)
Eosinophils Relative: 3 %
HCT: 41.8 % (ref 39.0–52.0)
Hemoglobin: 13.7 g/dL (ref 13.0–17.0)
Immature Granulocytes: 0 %
Lymphocytes Relative: 23 %
Lymphs Abs: 2.1 10*3/uL (ref 0.7–4.0)
MCH: 28.4 pg (ref 26.0–34.0)
MCHC: 32.8 g/dL (ref 30.0–36.0)
MCV: 86.5 fL (ref 80.0–100.0)
Monocytes Absolute: 0.6 10*3/uL (ref 0.1–1.0)
Monocytes Relative: 7 %
Neutro Abs: 6.2 10*3/uL (ref 1.7–7.7)
Neutrophils Relative %: 67 %
Platelets: 238 10*3/uL (ref 150–400)
RBC: 4.83 MIL/uL (ref 4.22–5.81)
RDW: 13.3 % (ref 11.5–15.5)
WBC: 9.3 10*3/uL (ref 4.0–10.5)
nRBC: 0 % (ref 0.0–0.2)

## 2021-07-02 LAB — BASIC METABOLIC PANEL
Anion gap: 5 (ref 5–15)
BUN: 14 mg/dL (ref 6–20)
CO2: 27 mmol/L (ref 22–32)
Calcium: 8.6 mg/dL — ABNORMAL LOW (ref 8.9–10.3)
Chloride: 106 mmol/L (ref 98–111)
Creatinine, Ser: 0.69 mg/dL (ref 0.61–1.24)
GFR, Estimated: >60 mL/min (ref >60)
Glucose, Bld: 111 mg/dL — ABNORMAL HIGH (ref 70–99)
Potassium: 3.5 mmol/L (ref 3.5–5.1)
Sodium: 138 mmol/L (ref 135–145)

## 2021-07-02 LAB — TROPONIN I (HIGH SENSITIVITY): Troponin I (High Sensitivity): 5 ng/L (ref <18)

## 2021-07-02 MED ORDER — ALBUTEROL SULFATE HFA 108 (90 BASE) MCG/ACT IN AERS
2.0000 | INHALATION_SPRAY | Freq: Once | RESPIRATORY_TRACT | Status: AC
  Administered 2021-07-02: 2 via RESPIRATORY_TRACT
  Filled 2021-07-02: qty 6.7

## 2021-07-02 MED ORDER — OXYMETAZOLINE HCL 0.05 % NA SOLN
1.0000 | Freq: Once | NASAL | Status: AC
  Administered 2021-07-02: 1 via NASAL
  Filled 2021-07-02: qty 30

## 2021-07-02 NOTE — ED Triage Notes (Signed)
Patient brought in via ems from work. Patient had sudden onset of SOB wit midsternal chest pain. Patient states he has had episodes of SOB for about 1 week ?

## 2021-07-02 NOTE — Discharge Instructions (Addendum)
Your EKG, chest x-ray and laboratory work-up were reassuring.  Symptoms are consistent with likely panic attack. Recommend routine follow-up with your PCP. ?

## 2021-07-02 NOTE — ED Provider Notes (Signed)
?[facility]-EMERGENCY DEPT ?Provider Note ? ? ?CSN: [id number] ?Arrival date & time: 07/02/21  1027 ? ?  ? ?History ? ?Chief Complaint  ?Patient presents with  ? Shortness of Breath  ? Chest Pain  ? ? ?Robert Stokes is a 31 y.o. male. ? ? ?Shortness of Breath ?Associated symptoms: chest pain   ?Chest Pain ?Associated symptoms: shortness of breath   ? ? 31 year old male with a medical history significant for asthma, obesity, OSA, ADHD presenting with sweating and shortness of breath.  The patient felt congested this morning and felt like his allergies were acting up.  While at work he suddenly experienced a feeling of diaphoresis, abruptly became short of breath, and experienced roughly 10 minutes of chest discomfort described as a tightness.  Symptoms have since resolved.  He denies any fevers, chills, cough. ? ?Home Medications ?Prior to Admission medications   ?Medication Sig Start Date End Date Taking? Authorizing Provider  ?buPROPion (WELLBUTRIN SR) 150 MG 12 hr tablet Take 1 tablet (150 mg total) by mouth daily. 06/05/21   Quillian Quince D, MD  ?diclofenac (VOLTAREN) 50 MG EC tablet Take 50 mg by mouth daily.    [provider]  ?meloxicam (MOBIC) 15 MG tablet Take 1 tablet (15 mg total) by mouth daily. Take 1 daily with food. 09/01/20   Arthor Captain, PA-C  ?methylPREDNISolone (MEDROL DOSEPAK) 4 MG TBPK tablet 6 Day Taper Pack. Take as Directed. 11/08/20   Park Liter, DPM  ?triamcinolone (NASACORT) 55 MCG/ACT AERO nasal inhaler Place 2 sprays into the nose daily. 10/24/20   Dohmeier, Porfirio Mylar, MD  ?   ? ?Allergies    ?Patient has no known allergies.   ? ?Review of Systems   ?Review of Systems  ?Respiratory:  Positive for shortness of breath.   ?Cardiovascular:  Positive for chest pain.  ? ?Physical Exam ?Updated Vital Signs ?BP (!) 166/72   Pulse 73   Temp 98 ?F (36.7 ?C) (Oral)   Resp 20   Ht 5\' 11"  (1.803 m)   Wt (!) 186.4 kg   SpO2 93%   BMI 57.32 kg/m?  ?Physical  Exam ?Vitals and nursing note reviewed.  ?Constitutional:   ?   General: He is not in acute distress. ?   Appearance: He is well-developed.  ?HENT:  ?   Head: Normocephalic and atraumatic.  ?Eyes:  ?   Conjunctiva/sclera: Conjunctivae normal.  ?Cardiovascular:  ?   Rate and Rhythm: Normal rate and regular rhythm.  ?   Heart sounds: No murmur heard. ?Pulmonary:  ?   Effort: Pulmonary effort is normal. No respiratory distress.  ?   Breath sounds: Normal breath sounds.  ?Abdominal:  ?   Palpations: Abdomen is soft.  ?   Tenderness: There is no abdominal tenderness.  ?Musculoskeletal:     ?   General: No swelling.  ?   Cervical back: Neck supple.  ?Skin: ?   General: Skin is warm and dry.  ?   Capillary Refill: Capillary refill takes less than 2 seconds.  ?Neurological:  ?   Mental Status: He is alert.  ?Psychiatric:     ?   Mood and Affect: Mood normal.  ? ? ?ED Results / Procedures / Treatments   ?Labs ?(all labs ordered are listed, but only abnormal results are displayed) ?Labs Reviewed  ?BASIC METABOLIC PANEL - Abnormal; Notable for the following components:  ?    Result Value  ? Glucose, Bld 111 (*)   ?  Calcium 8.6 (*)   ? All other components within normal limits  ?CBC WITH DIFFERENTIAL/PLATELET  ?TROPONIN I (HIGH SENSITIVITY)  ? ? ?EKG ?EKG Interpretation ? ?Date/Time:  Wednesday July 02 2021 10:40:05 EDT ?Ventricular Rate:  85 ?PR Interval:  152 ?QRS Duration: 113 ?QT Interval:  380 ?QTC Calculation: 452 ?R Axis:   -42 ?Text Interpretation: Sinus rhythm Incomplete RBBB and LAFB No significant change since last tracing Confirmed by Ernie Avena ([id number]) on 07/02/2021 12:53:42 PM ? ?Radiology ?DG Chest 2 View ? ?Result Date: 07/02/2021 ?CLINICAL DATA:  Seven onset shortness of breath with midsternal chest pain EXAM: CHEST - 2 VIEW COMPARISON:  09/01/2020 FINDINGS: Cardiomediastinal silhouette and pulmonary vasculature are within normal limits. Lungs are clear. IMPRESSION: No acute cardiopulmonary process.  Electronically Signed   By: Acquanetta Belling M.D.   On: 07/02/2021 12:46   ? ?Procedures ?Procedures  ? ? ?Medications Ordered in ED ?Medications  ?oxymetazoline (AFRIN) 0.05 % nasal spray 1 spray (1 spray Each Nare Given 07/02/21 1117)  ?albuterol (VENTOLIN HFA) 108 (90 Base) MCG/ACT inhaler 2 puff (2 puffs Inhalation Given 07/02/21 1241)  ? ? ?ED Course/ Medical Decision Making/ A&P ?  ?                        ?Medical Decision Making ?Amount and/or Complexity of Data Reviewed ?Labs: ordered. ?Radiology: ordered. ? ?Risk ?OTC drugs. ?Prescription drug management. ? ? ? ? 31 year old male with a medical history significant for asthma, obesity, OSA, ADHD presenting with sweating and shortness of breath.  The patient felt congested this morning and felt like his allergies were acting up.  While at work he suddenly experienced a feeling of diaphoresis, abruptly became short of breath, and experienced roughly 10 minutes of chest discomfort described as a tightness.  Symptoms have since resolved.  He denies any fevers, chills, cough. ? ?On arrival, the patient was afebrile, hypertensive BP 158/68, saturating well on room air.  Not tachycardic or tachypneic.  Sinus rhythm noted on cardiac telemetry. ? ?Given the complaint of 10 minutes of chest tightness, chest pain work-up initiated to include an EKG which revealed sinus rhythm, ventricular rate 85, right bundle blanch block and left anterior fascicular block, no ischemic changes with no ST segment elevations or T wave inversions, no other abnormal intervals, QTc normal.  Chest x-ray was performed which revealed no acute cardiac or pulmonary abnormality.  Patient endorsed some nasal congestion and was administered Afrin.  He was given 2 puffs of an albuterol inhaler.  Lungs overall clear to auscultation bilaterally on repeat assessment.  Has had intermittent episodes of chest tightness and shortness of breath.  What he describes sounds most consistent with a panic attack.   Currently chest pain-free.  PERC negative.  Cardiac work-up initiated to include BMP without electrolyte abnormality, CBC without any anemia with a hemoglobin of 13.7, no leukocytosis WBC 9.3, initial troponin negative.  Given patient's symptoms, do not think repeat troponin testing is warranted at this time.  Patient symptoms consistent with likely panic attack and likely nasal congestion from  seasonal allergies.  Overall stable for discharge at this time.  Follow-up with PCP. ? ? ? ?Final Clinical Impression(s) / ED Diagnoses ?Final diagnoses:  ?Shortness of breath  ?Panic attack  ?Seasonal allergies  ? ? ?Rx / DC Orders ?ED Discharge Orders   ? ? None  ? ?  ? ? ?  ?Ernie Avena, MD ?07/03/21 1549 ? ?

## 2021-07-03 NOTE — ED Notes (Signed)
Pt requested reprint of work note, note provided. ?

## 2021-07-07 ENCOUNTER — Other Ambulatory Visit: Payer: Self-pay | Admitting: Primary Care

## 2021-07-07 ENCOUNTER — Ambulatory Visit (INDEPENDENT_AMBULATORY_CARE_PROVIDER_SITE_OTHER): Payer: Medicare Other | Admitting: Primary Care

## 2021-07-07 ENCOUNTER — Encounter: Payer: Self-pay | Admitting: Primary Care

## 2021-07-07 VITALS — BP 124/68 | HR 84 | Temp 98.7°F | Ht 71.0 in | Wt >= 6400 oz

## 2021-07-07 DIAGNOSIS — G4719 Other hypersomnia: Secondary | ICD-10-CM

## 2021-07-07 DIAGNOSIS — R0902 Hypoxemia: Secondary | ICD-10-CM | POA: Insufficient documentation

## 2021-07-07 DIAGNOSIS — G4736 Sleep related hypoventilation in conditions classified elsewhere: Secondary | ICD-10-CM

## 2021-07-07 DIAGNOSIS — Z9989 Dependence on other enabling machines and devices: Secondary | ICD-10-CM

## 2021-07-07 DIAGNOSIS — G4733 Obstructive sleep apnea (adult) (pediatric): Secondary | ICD-10-CM

## 2021-07-07 DIAGNOSIS — G4730 Sleep apnea, unspecified: Secondary | ICD-10-CM | POA: Diagnosis not present

## 2021-07-07 DIAGNOSIS — R0602 Shortness of breath: Secondary | ICD-10-CM | POA: Diagnosis not present

## 2021-07-07 DIAGNOSIS — G4734 Idiopathic sleep related nonobstructive alveolar hypoventilation: Secondary | ICD-10-CM

## 2021-07-07 MED ORDER — ALBUTEROL SULFATE HFA 108 (90 BASE) MCG/ACT IN AERS: 2.0000 | INHALATION_SPRAY | Freq: Four times a day (QID) | RESPIRATORY_TRACT | 1 refills | Status: DC | PRN

## 2021-07-07 NOTE — Assessment & Plan Note (Addendum)
-   Hx sleep apnea, managed by Dr. Brett Fairy with Neurology. He has persistent daytime sleepiness. Referred d/t hypoxemia and need for oxygen. Ordered for CPAP titration to evaluate for possible BIPAP need and oxygen.  ?

## 2021-07-07 NOTE — Assessment & Plan Note (Addendum)
-   Patient is morbidly obese. He has shortness of breath with any exertion. Associated wheezing. He did not desaturate on simple walk test today, SpO2 low with ambulation was 96%. CXR on 07/02/21 showed clear lungs. Needs full pulmonary function testing. Sending in RX for Albuterol hfa to use as needed every 4-6 hours for shortness of breath/wheezing.  ?

## 2021-07-07 NOTE — Patient Instructions (Addendum)
Recommendations: ?Continue to wear CPAP every night min 4-6 hours or more ?Do not drive if experiencing excessive daytime sleepiness ?Use Albuterol 2 puffs every 4-6 hours for shortness of breath or wheezing  ?Work on weight loss efforts  ?You do not need oxygen during the day time ? ?Orders: ?Ambulatory walk test  ?CPAP titration re: OSA, nocturnal hypoxia (ordered-urgent priority)  ?Pulmonary function testing re: sob (ordered) ? ?Rx: ?Albuterol rescue inhaler  ? ?Follow-up: ?4 weeks or first available after that with either Dr. Trinidad Curet or Annamaria Boots only (1 hour PFTs prior) ?

## 2021-07-07 NOTE — Progress Notes (Signed)
? ?@Patient  ID: Robert Stokes, male    DOB: 06/04/1990, 31 y.o.   MRN: [id number] ? ?Chief Complaint  ?Patient presents with  ? Consult  ?  Pt is here for sleep consult. Has had sleep study done before in the year. He states he is on cpap therapy currently.   ? ? ?Referring provider: ?Dohmeier, Porfirio Mylararmen, MD ? ?HPI: ?31 year old male, never smoked. PMH significant for comorbid sleep related hypoventilation, obesity, excessive daytime sleepiness, insomnia, shortness of breath.  ? ?07/07/2021 ?Patient presents today for sleep consult. He is currently on CPAP, managed by Dr. Vickey Hugerohmeier.  ONO in March showed that he needed oxygen, per neurology note he spent 4 hours 55 mins with O2 <89%. He was referred to pulmonary to evaluate for possible underlying pulmonary cause for hypoxia and start oxygen. [employer] sending ONO results. He has not worn CPAP last two nights. No download available, per [employer] he has a Luna machine and they will send compliance report to us. Uses [employer]. He remains very tired despite being on CPAP for the last year. Works as Public affairs consultantdishwasher. He gets short of breath walking short distances. He can not do anything without having trouble breathing. He has some associated wheezing. No cough or chest tightness. He had a CXR on 07/02/21 that showed clear lungs. His father drove him to visit today.  ? ?Sleep questionnaire: ?Symptoms- daytime sleepiness, snoring ?Bedtime - 10pm-2am ?Time to fall asleep- 30-245min ?Nocturnal awakenings- 5-7 times ?Start of day- 7am ?Weight gain- patient unsure  ?CPAP- yes ?Oxygen- No ?Epworth- 22 ? ?No Known Allergies ? ? ?There is no immunization history on file for this patient. ? ?Past Medical History:  ?Diagnosis Date  ? ADD (attention deficit disorder)   ? ADHD   ? Asthma   ? Back pain   ? Joint pain   ? Obesity   ? Sleep apnea   ? SOB (shortness of breath)   ? Swelling of lower extremity   ? ? ?Tobacco History: ?Social History  ? ?Tobacco Use  ?Smoking Status Never  ?Smokeless  Tobacco Never  ? ?Counseling given: Not Answered ? ? ?Outpatient Medications Prior to Visit  ?Medication Sig Dispense Refill  ? buPROPion (WELLBUTRIN SR) 150 MG 12 hr tablet Take 1 tablet (150 mg total) by mouth daily. 30 tablet 0  ? diclofenac (VOLTAREN) 50 MG EC tablet Take 50 mg by mouth daily.    ? diclofenac Sodium (VOLTAREN) 1 % GEL Apply topically.    ? meloxicam (MOBIC) 15 MG tablet Take 1 tablet (15 mg total) by mouth daily. Take 1 daily with food. 10 tablet 0  ? methylPREDNISolone (MEDROL DOSEPAK) 4 MG TBPK tablet 6 Day Taper Pack. Take as Directed. 21 tablet 0  ? triamcinolone (NASACORT) 55 MCG/ACT AERO nasal inhaler Place 2 sprays into the nose daily. 1 each 12  ? ?No facility-administered medications prior to visit.  ? ? ? ? ?Review of Systems ? ?Review of Systems  ?Constitutional:  Positive for fatigue.  ?HENT: Negative.    ?Respiratory:  Positive for shortness of breath and wheezing. Negative for cough and chest tightness.   ?Cardiovascular: Negative.   ? ? ?Physical Exam ? ?BP 124/68 (BP Location: Left Arm, Patient Position: Sitting, Cuff Size: Normal)   Pulse 84   Temp 98.7 ?F (37.1 ?C) (Oral)   Ht 5\' 11"  (1.803 m)   Wt (!) 420 lb (190.5 kg)   SpO2 96%   BMI 58.58 kg/m?  ?Physical Exam ?Constitutional:   ?  General: He is not in acute distress. ?   Appearance: Normal appearance. He is obese. He is not ill-appearing.  ?HENT:  ?   Head: Normocephalic and atraumatic.  ?   Mouth/Throat:  ?   Mouth: Mucous membranes are moist.  ?   Pharynx: Oropharynx is clear.  ?Cardiovascular:  ?   Rate and Rhythm: Normal rate and regular rhythm.  ?Pulmonary:  ?   Effort: Pulmonary effort is normal.  ?   Breath sounds: Normal breath sounds.  ?Musculoskeletal:     ?   General: Normal range of motion.  ?   Cervical back: Normal range of motion and neck supple.  ?Skin: ?   General: Skin is warm and dry.  ?Neurological:  ?   General: No focal deficit present.  ?   Mental Status: He is alert and oriented to person,  place, and time. Mental status is at baseline.  ?Psychiatric:     ?   Mood and Affect: Mood normal.     ?   Behavior: Behavior normal.     ?   Thought Content: Thought content normal.     ?   Judgment: Judgment normal.  ?  ? ?Lab Results: ? ?CBC ?   ?Component Value Date/Time  ? WBC 9.3 07/02/2021 1223  ? RBC 4.83 07/02/2021 1223  ? HGB 13.7 07/02/2021 1223  ? HGB 13.7 08/21/2020 0917  ? HCT 41.8 07/02/2021 1223  ? HCT 43.5 08/21/2020 0917  ? PLT 238 07/02/2021 1223  ? MCV 86.5 07/02/2021 1223  ? MCV 87 08/21/2020 0917  ? MCH 28.4 07/02/2021 1223  ? MCHC 32.8 07/02/2021 1223  ? RDW 13.3 07/02/2021 1223  ? RDW 13.3 08/21/2020 0917  ? LYMPHSABS 2.1 07/02/2021 1223  ? LYMPHSABS 1.8 08/21/2020 0917  ? MONOABS 0.6 07/02/2021 1223  ? EOSABS 0.3 07/02/2021 1223  ? EOSABS 0.3 08/21/2020 0917  ? BASOSABS 0.0 07/02/2021 1223  ? BASOSABS 0.0 08/21/2020 8185  ? ? ?BMET ?   ?Component Value Date/Time  ? NA 138 07/02/2021 1223  ? NA 138 08/21/2020 0917  ? K 3.5 07/02/2021 1223  ? CL 106 07/02/2021 1223  ? CO2 27 07/02/2021 1223  ? GLUCOSE 111 (H) 07/02/2021 1223  ? BUN 14 07/02/2021 1223  ? BUN 17 08/21/2020 0917  ? CREATININE 0.69 07/02/2021 1223  ? CREATININE 0.73 11/24/2019 1051  ? CALCIUM 8.6 (L) 07/02/2021 1223  ? GFRNONAA >60 07/02/2021 1223  ? GFRAA >60 08/23/2019 1237  ? ? ?BNP ?   ?Component Value Date/Time  ? BNP 37.9 09/01/2020 1402  ? ? ?ProBNP ?No results found for: PROBNP ? ?Imaging: ?DG Chest 2 View ? ?Result Date: 07/02/2021 ?CLINICAL DATA:  Seven onset shortness of breath with midsternal chest pain EXAM: CHEST - 2 VIEW COMPARISON:  09/01/2020 FINDINGS: Cardiomediastinal silhouette and pulmonary vasculature are within normal limits. Lungs are clear. IMPRESSION: No acute cardiopulmonary process. Electronically Signed   By: Acquanetta Belling M.D.   On: 07/02/2021 12:46   ? ? ?Assessment & Plan:  ? ?OSA on CPAP ?- Hx sleep apnea, managed by Dr. Vickey Huger with Neurology. He has persistent daytime sleepiness. Referred d/t  hypoxemia and need for oxygen. Ordered for CPAP titration to evaluate for possible BIPAP need and oxygen.  ? ?SOB (shortness of breath) on exertion ?- Patient is morbidly obese. He has shortness of breath with any exertion. Associated wheezing. He did not desaturate on simple walk test today, SpO2 low with ambulation was 96%. CXR  on 07/02/21 showed clear lungs. Needs full pulmonary function testing. Sending in RX for Albuterol hfa to use as needed every 4-6 hours for shortness of breath/wheezing.  ? ?Nocturnal hypoxia ?- Patient had ONO in March 2023 that showed severe oxygen desaturations while on CPAP. [employer] is sending ONO report. Needs CPAP titration to assess for oxygen need.  ? ? ? ?Glenford Bayley, NP ?07/07/2021 ? ?

## 2021-07-07 NOTE — Assessment & Plan Note (Signed)
-   Patient had ONO in March 2023 that showed severe oxygen desaturations while on CPAP. [employer] is sending ONO report. Needs CPAP titration to assess for oxygen need.  ? ?

## 2021-07-08 NOTE — Progress Notes (Signed)
Reviewed and agree with assessment/plan. ? ? ?Ledford Goodson, MD ?[facility] ?07/08/2021, 12:14 PM ?Pager:  [phone] ? ?

## 2021-07-17 ENCOUNTER — Other Ambulatory Visit: Payer: Self-pay | Admitting: Neurology

## 2021-07-17 ENCOUNTER — Ambulatory Visit (INDEPENDENT_AMBULATORY_CARE_PROVIDER_SITE_OTHER): Payer: Medicare Other | Admitting: Family Medicine

## 2021-07-17 ENCOUNTER — Encounter (INDEPENDENT_AMBULATORY_CARE_PROVIDER_SITE_OTHER): Payer: Self-pay | Admitting: Family Medicine

## 2021-07-17 ENCOUNTER — Telehealth: Payer: Self-pay | Admitting: Neurology

## 2021-07-17 VITALS — BP 138/80 | Temp 97.6°F | Ht 71.0 in | Wt >= 6400 oz

## 2021-07-17 DIAGNOSIS — R0602 Shortness of breath: Secondary | ICD-10-CM | POA: Diagnosis not present

## 2021-07-17 DIAGNOSIS — R0683 Snoring: Secondary | ICD-10-CM

## 2021-07-17 DIAGNOSIS — E669 Obesity, unspecified: Secondary | ICD-10-CM | POA: Diagnosis not present

## 2021-07-17 DIAGNOSIS — E8881 Metabolic syndrome: Secondary | ICD-10-CM | POA: Diagnosis not present

## 2021-07-17 DIAGNOSIS — E559 Vitamin D deficiency, unspecified: Secondary | ICD-10-CM | POA: Diagnosis not present

## 2021-07-17 DIAGNOSIS — R0902 Hypoxemia: Secondary | ICD-10-CM

## 2021-07-17 DIAGNOSIS — G4719 Other hypersomnia: Secondary | ICD-10-CM

## 2021-07-17 DIAGNOSIS — Z6841 Body Mass Index (BMI) 40.0 and over, adult: Secondary | ICD-10-CM

## 2021-07-17 DIAGNOSIS — R519 Headache, unspecified: Secondary | ICD-10-CM

## 2021-07-17 DIAGNOSIS — Z9189 Other specified personal risk factors, not elsewhere classified: Secondary | ICD-10-CM

## 2021-07-17 NOTE — Telephone Encounter (Signed)
Called the patient and I discussed with him that I spoke with [employer]. I went through the spill advising of the process per medicare guidelines and pt states he has a sleep study coming up on Sunday. I assumed that was with our office since we handle his sleep and advised that I would have Dr Vickey Huger addend her note from feb and then we can use the sleep study from Sunday.  ?After hanging up, I went and spoke with the sleep lab to update them on what needed to happen and she did not see him on our schedule. The patient was on the schedule with [facility] for a sleep study to titrate the oxygen (from our referral post ONO). I advised that we would not be able to use that study and get insurance approval for another sleep study.  ?Pt has been struggling for months with not being able to sleep. He states he has several emails from [employer] where they keep saying that they are going to send his supplies and they haven't. He is frustrated and feels like he is getting the run around. I advised this is a insurance thing and pt understands it is not [name].  ?I was able to work things out where he can complete the sleep study at our location at 8 pm on 07/27/2021. Advised to turn his machine in to the company. Advised once we have the sleep study completed we can start the process with getting him a new machine. The sleep study was written for them to also titrate oxygen during the study.  ?

## 2021-07-17 NOTE — Telephone Encounter (Signed)
Per Morrie Sheldon with [employer] her response when asked to look into this.  ? ? ?"It looks like he was non compliant with his machine. He is using a Luna and I am going to see if I can pull a report for that to find the compliance. Since he has [facility] Medicare he and if he truly failed compliance then he will also have to have an In-Lab sleep Study to get another machine." ?

## 2021-07-17 NOTE — Telephone Encounter (Signed)
I am reaching out to the company to find out what is going on. Will contact patient once have more information.  ?

## 2021-07-17 NOTE — Telephone Encounter (Signed)
Pt has called to report that he was unable to get his CPAP supplies from the DME and he is now being told the DME wants the CPAP back.  Pt states they told him that before they accept the CPAP back he needs to speak with Dr Brett Fairy.  Pt is aware of his upcoming sleep study, he is asking for a call. ?

## 2021-07-17 NOTE — Telephone Encounter (Signed)
Per Morrie Sheldon, ?"note in our system that he spoke to someone get this! on 03/20/21 that he hasn't been using his machine because he couldn't afford distilled water. Of course there isn't going to be any data he didn't use it. When I was on the phone with him it sounded like he took the machine out of the bag (sounded like he was unzipping it) and I had him redownload the app and walked him through how to scan the code. I'm still not showing any data. I let him know that he would need to return the machine back to the local office and he will need a new in-lab sleep study and yall can send a new order over after that!" ? ?

## 2021-07-18 LAB — CMP14+EGFR
ALT: 26 IU/L (ref 0–44)
AST: 15 IU/L (ref 0–40)
Albumin/Globulin Ratio: 1.7 (ref 1.2–2.2)
Albumin: 4.4 g/dL (ref 4.0–5.0)
Alkaline Phosphatase: 115 IU/L (ref 44–121)
BUN/Creatinine Ratio: 20 (ref 9–20)
BUN: 14 mg/dL (ref 6–20)
Bilirubin Total: 0.4 mg/dL (ref 0.0–1.2)
CO2: 24 mmol/L (ref 20–29)
Calcium: 9.4 mg/dL (ref 8.7–10.2)
Chloride: 103 mmol/L (ref 96–106)
Creatinine, Ser: 0.71 mg/dL — ABNORMAL LOW (ref 0.76–1.27)
Globulin, Total: 2.6 g/dL (ref 1.5–4.5)
Glucose: 95 mg/dL (ref 70–99)
Potassium: 4.1 mmol/L (ref 3.5–5.2)
Sodium: 143 mmol/L (ref 134–144)
Total Protein: 7 g/dL (ref 6.0–8.5)
eGFR: 125 mL/min/{1.73_m2} (ref >59)

## 2021-07-18 LAB — INSULIN, RANDOM: INSULIN: 34.8 u[IU]/mL — ABNORMAL HIGH (ref 2.6–24.9)

## 2021-07-18 LAB — HEMOGLOBIN A1C
Est. average glucose Bld gHb Est-mCnc: 97 mg/dL
Hgb A1c MFr Bld: 5 % (ref 4.8–5.6)

## 2021-07-18 LAB — VITAMIN D 25 HYDROXY (VIT D DEFICIENCY, FRACTURES): Vit D, 25-Hydroxy: 10.5 ng/mL — ABNORMAL LOW (ref 30.0–100.0)

## 2021-07-20 ENCOUNTER — Encounter (HOSPITAL_BASED_OUTPATIENT_CLINIC_OR_DEPARTMENT_OTHER): Payer: Medicare Other | Admitting: Pulmonary Disease

## 2021-07-27 ENCOUNTER — Ambulatory Visit (INDEPENDENT_AMBULATORY_CARE_PROVIDER_SITE_OTHER): Payer: Medicare Other | Admitting: Neurology

## 2021-07-27 DIAGNOSIS — G4719 Other hypersomnia: Secondary | ICD-10-CM

## 2021-07-27 DIAGNOSIS — G4734 Idiopathic sleep related nonobstructive alveolar hypoventilation: Secondary | ICD-10-CM

## 2021-07-27 DIAGNOSIS — R519 Headache, unspecified: Secondary | ICD-10-CM

## 2021-07-27 DIAGNOSIS — G4733 Obstructive sleep apnea (adult) (pediatric): Secondary | ICD-10-CM | POA: Diagnosis not present

## 2021-07-27 DIAGNOSIS — R0902 Hypoxemia: Secondary | ICD-10-CM

## 2021-07-27 DIAGNOSIS — R0683 Snoring: Secondary | ICD-10-CM

## 2021-07-27 DIAGNOSIS — E669 Obesity, unspecified: Secondary | ICD-10-CM

## 2021-07-30 NOTE — Progress Notes (Signed)
? ? ? ?Chief Complaint:  ? ?OBESITY ?Md is here to discuss his progress with his obesity treatment plan along with follow-up of his obesity related diagnoses. Robert Stokes is on keeping a food journal and adhering to recommended goals of 1500-1800 calories and 120 grams of protein daily and states he is following his eating plan approximately 50% of the time. Robert Stokes states he is walking 6 times per week.   ? ?Today's visit was #: 13 ?Starting weight: 412 lbs ?Starting date: 08/21/2020 ?Today's weight: 406 lbs ?Today's date: 07/17/2021 ?Total lbs lost to date: 6 ?Total lbs lost since last in-office visit: 6 ? ?Interim History: Robert Stokes has journaled on and off. He has increased hydration. He is working on trying to portion control and Therapist, nutritional.  ? ?Subjective:  ? ?1. SOBOE (shortness of breath on exertion) ?Robert Stokes notes some increased shortness of breath recently with worsening pollen. Baseline shortness of breath with exertion. ? ?2. Insulin resistance ?Robert Stokes is working on decreasing his simple carbohydrates and weight loss. He is due for labs.  ? ?3. Vitamin D deficiency ?Robert Stokes's Vitamin D has been low previously, and she still notes fatigue.  ? ?4. At risk for heart disease ?Robert Stokes is at higher than average risk for cardiovascular disease due to obesity. ? ?Assessment/Plan:  ? ?1. SOBOE (shortness of breath on exertion) ?Kala's repeat IC shows his RMR is at goal. Elester has agreed to work on weight loss and gradually increase exercise to treat his exercise induced shortness of breath. Will continue to monitor closely. ? ?2. Insulin resistance ?We will check labs today. Fabyan will continue with his diet and exercise. ? ?- CMP14+EGFR ?- Insulin, random ?- Hemoglobin A1c ? ?3. Vitamin D deficiency ?We will check labs today, and will follow up at Robert Stokes's next visit. ? ?- VITAMIN D 25 Hydroxy (Vit-D Deficiency, Fractures) ? ?4. At risk for heart disease ?Robert Stokes was given approximately 15 minutes of  coronary artery disease prevention counseling today. He is 31 y.o. male and has risk factors for heart disease including obesity. We discussed intensive lifestyle modifications today with an emphasis on specific weight loss instructions and strategies. ? ?Repetitive spaced learning was employed today to elicit superior memory formation and behavioral change.  ? ?5. Obesity, current BMI 56.6 ?Robert Stokes is currently in the action stage of change. As such, his goal is to continue with weight loss efforts. He has agreed to keeping a food journal and adhering to recommended goals of 1800 calories and 120 grams of protein daily.  ? ?Exercise goals: As is. ? ?Behavioral modification strategies: increasing lean protein intake and decreasing eating out. ? ?Robert Stokes has agreed to follow-up with our clinic in 4 weeks. He was informed of the importance of frequent follow-up visits to maximize his success with intensive lifestyle modifications for his multiple health conditions.  ? ?Robert Stokes was informed we would discuss his lab results at his next visit unless there is a critical issue that needs to be addressed sooner. Robert Stokes agreed to keep his next visit at the agreed upon time to discuss these results. ? ?Objective:  ? ?Blood pressure 138/80, temperature 97.6 ?F (36.4 ?C), height _0  (1.803 m), weight (!) 406 lb (184.2 kg), SpO2 96 %. ?Body mass index is 56.63 kg/m?. ? ?General: Cooperative, alert, well developed, in no acute distress. ?HEENT: Conjunctivae and lids unremarkable. ?Cardiovascular: Regular rhythm.  ?Lungs: Normal work of breathing. ?Neurologic: No focal deficits.  ? ?Lab Results  ?Component Value Date  ? CREATININE  0.71 (L) 07/17/2021  ? BUN 14 07/17/2021  ? NA 143 07/17/2021  ? K 4.1 07/17/2021  ? CL 103 07/17/2021  ? CO2 24 07/17/2021  ? ?Lab Results  ?Component Value Date  ? ALT 26 07/17/2021  ? AST 15 07/17/2021  ? ALKPHOS 115 07/17/2021  ? BILITOT 0.4 07/17/2021  ? ?Lab Results  ?Component Value Date  ? HGBA1C  5.0 07/17/2021  ? HGBA1C 5.0 08/21/2020  ? ?Lab Results  ?Component Value Date  ? INSULIN 34.8 (H) 07/17/2021  ? INSULIN 23.4 08/21/2020  ? ?Lab Results  ?Component Value Date  ? TSH 2.870 08/21/2020  ? ?Lab Results  ?Component Value Date  ? CHOL 184 08/21/2020  ? HDL 39 (L) 08/21/2020  ? LDLCALC 126 (H) 08/21/2020  ? TRIG 104 08/21/2020  ? ?Lab Results  ?Component Value Date  ? VD25OH 10.5 (L) 07/17/2021  ? VD25OH 12.2 (L) 08/21/2020  ? ?Lab Results  ?Component Value Date  ? WBC 9.3 07/02/2021  ? HGB 13.7 07/02/2021  ? HCT 41.8 07/02/2021  ? MCV 86.5 07/02/2021  ? PLT 238 07/02/2021  ? ?No results found for: IRON, TIBC, FERRITIN ? ?Attestation Statements:  ? ?Reviewed by clinician on day of visit: allergies, medications, problem list, medical history, surgical history, family history, social history, and previous encounter notes. ? ? ?I, Trixie Dredge, am acting as transcriptionist for Dennard Nip, MD. ? ?I have reviewed the above documentation for accuracy and completeness, and I agree with the above. -  Dennard Nip, MD ? ? ?

## 2021-08-04 NOTE — Procedures (Signed)
PATIENT'S NAME:  Robert Stokes, Robert Stokes ?DOB:      October 10, 1990      ?MR#:    [id number]     ?DATE OF RECORDING: 07/27/2021 ?REFERRING M.D.:  Alois Cliche PA-C , bariatric surgery candidate  ?Study Performed:  Split-Night Titration Study ?HISTORY:  Robert Stokes returns after a sleep study documenting AHI of 20.3/h, REM AHI of 75/h.  and prolonged hypoxia. An ONO was performed without CPAP - and showed again hypoxia, but the study was meant to document if there was still hypoxia under PAP therapy.  here for oxygen titration under Dx of Obesity hypoventilation. ? ?The patient endorsed the Epworth Sleepiness Scale at 9 points   ?The patient's weight 411 pounds with a height of 71 (inches), resulting in a BMI of 57.4 kg/m2. ?The patient's neck circumference measured 22 inches. ? ?CURRENT MEDICATIONS: Voltaren, Mobic, Wellbutrin SR, Glucophage, Medrol Dosepak, Rybelsus, Nasacort ? ?PROCEDURE:  This is a multichannel digital polysomnogram utilizing the Somnostar 11.2 system.  Electrodes and sensors were applied and monitored per AASM Specifications.   EEG, EOG, Chin and Limb EMG, were sampled at 200 Hz.  ECG, Snore and Nasal Pressure, Thermal Airflow, Respiratory Effort, CPAP Flow and Pressure, Oximetry was sampled at 50 Hz. Digital video and audio were recorded.     ? ?BASELINE STUDY WITHOUT CPAP RESULTS: ? ?Lights Out was at 20:41 and Lights On at 04:59.  Total recording time (TRT) was 167.5, with a total sleep time (TST) of 151 minutes.   The patient's sleep latency was 6 minutes.  REM latency was 0 minutes.  The sleep efficiency was 90.1 %.  ?  ?SLEEP ARCHITECTURE: WASO (Wake after sleep onset) was 4.5 minutes, Stage N1 was 2.5 minutes, Stage N2 was 58 minutes, Stage N3 was 90.5 minutes and Stage R (REM sleep) was 0 minutes.  The percentages were Stage N1 1.7%, Stage N2 38.4%, Stage N3 59.9% and no Stage R (REM sleep) 0%.  ?RESPIRATORY ANALYSIS:  There were a total of 40 respiratory events:  0 apneas and 40 hypopneas with a  hypopnea index of 15.9/h.    ?The total APNEA/HYPOPNEA INDEX (AHI) was 15.9 /hour.  0 events occurred in REM sleep and 80 events in NREM. The patient spent 442 minutes sleep time in the supine position 12 minutes in non-supine. The supine AHI was 11.2 /hour versus a non-supine AHI of 73.0 /hour. ?OXYGEN SATURATION & C02:  The wake baseline 02 saturation was 94%, with the lowest being 69%. Time spent below 89% saturation equaled 103 minutes. ?PERIODIC LIMB MOVEMENTS: The patient had a total of 0 Periodic Limb Movements.   ? ? ? ?TITRATION STUDY WITH CPAP RESULTS:  CPAP was initiated at 10 cmH20 with heated humidity per AASM split night standards and pressure was advanced to 17 cmH20 with 3 cm EPR under an EVORA mask FFM in S-M size. At a PAP pressure of 17 cmH20, 3 cm EPR, there was a reduction of the AHI to 1.2 /hour.  ?Hypoxia was no longer present. ? ?Total recording time (TRT) was 331 minutes, with a total sleep time (TST) of 302.5 minutes. The patient's sleep latency was 22 minutes. REM latency was 30.5 minutes.  The sleep efficiency was 91.4 %.   ? ?SLEEP ARCHITECTURE: Wake after sleep was 10 minutes, Stage N1 6 minutes, Stage N2 158 minutes, Stage N3 47 minutes and Stage R (REM sleep) 91.5 minutes. The percentages were: Stage N1 2.%, Stage N2 52.2%, Stage N3 15.5% and Stage R (  REM sleep) 30.2%.  ? ?RESPIRATORY ANALYSIS:  There were a total of 36 respiratory events: 0 obstructive apneas, 3 central apneas and 0 mixed apneas with a total of 3 apneas and an apnea index (AI) of .6. There were 33 hypopneas with a hypopnea index of 6.5 /hour. The patient also had 0 respiratory event related arousals (RERAs).     ? ?The total APNEA/HYPOPNEA INDEX (AHI) was 7.1 /hour.  25 events occurred in REM sleep and 11 events in NREM. The REM AHI was 16.4 /hour versus a non-REM AHI of 3.1 /hour. REM sleep was first achieved on a pressure of 14 cm/h2o. ?The patient spent 100% of total sleep time in the supine position. The supine  AHI was 7.1 /hour, versus a non-supine AHI of 0.0/hour. ? ?OXYGEN SATURATION & C02:  The wake baseline 02 saturation was 94%, with the lowest being 77%. Time spent below 89% saturation equaled 18 minutes. ?The arousals were noted as: 32 were spontaneous, 0 were associated with PLMs, 4 were associated with respiratory events. ?The patient had a total of 0 Periodic Limb Movements.  ?The patient was fitted with a new interface, an Evora full face mask by F and P, small to medium size.   ? ?POLYSOMNOGRAPHY IMPRESSION :  ?Obstructive Sleep hypopnea/  hypoventilation, obesity related form of Apnea/ hypopnea with hypoxia responded well to CPAP at 17 cm water, 3 cm EPR and a new interface: an Evora full face mask by F and P, small to medium size.  Hypoxia and hypoventilation responded positively. The patient slept uninterrupted for several hours.  ? ?RECOMMENDATIONS: auto CPAP to be set to 10 through 17 cm water , 3 cm EPR and the above named interface: The patient was fitted with a new interface, an Evora full face mask by F and P, small to medium size.   ?A follow up appointment will be scheduled in the Sleep Clinic at [facility].    ?I certify that I have reviewed the entire raw data recording prior to the issuance of this report in accordance with the Standards of Accreditation of the American Academy of Sleep Medicine (AASM) ? ?Melvyn Novas, M.D. ?Medical Director, [facility] at [facility] ?Diplomat, Biomedical engineer of Neurology and Sleep Medicine (Neurology and Sleep Medicine) ? ? ? ?

## 2021-08-04 NOTE — Progress Notes (Signed)
POLYSOMNOGRAPHY IMPRESSION?:  ?Obstructive Sleep hypopnea/ manifesting in hypoventilation, is an obesity related form of Apnea/ hypopnea with hypoxia.  ?This responded well to CPAP at 17 cm water, 3 cm EPR and a new interface: an Evora full face mask by F and P, small to medium size.  Hypoxia and hypoventilation responded positively. The patient slept uninterrupted for several hours.  ?? ?RECOMMENDATIONS: auto CPAP to be set to 10 through 17 cm water , 3 cm EPR and the above named interface: The patient was fitted with a new interface, an Evora full face mask by F and P, small to medium size.

## 2021-08-04 NOTE — Addendum Note (Signed)
Addended by: Melvyn Novas on: 08/04/2021 05:23 PM ? ? Modules accepted: Orders ? ?

## 2021-08-05 ENCOUNTER — Encounter: Payer: Self-pay | Admitting: Neurology

## 2021-08-14 ENCOUNTER — Ambulatory Visit (INDEPENDENT_AMBULATORY_CARE_PROVIDER_SITE_OTHER): Payer: Medicare Other | Admitting: Family Medicine

## 2021-08-14 ENCOUNTER — Encounter (INDEPENDENT_AMBULATORY_CARE_PROVIDER_SITE_OTHER): Payer: Self-pay | Admitting: Family Medicine

## 2021-08-14 VITALS — BP 135/76 | HR 81 | Temp 97.9°F | Ht 71.0 in | Wt >= 6400 oz

## 2021-08-14 DIAGNOSIS — E559 Vitamin D deficiency, unspecified: Secondary | ICD-10-CM

## 2021-08-14 DIAGNOSIS — E669 Obesity, unspecified: Secondary | ICD-10-CM

## 2021-08-14 DIAGNOSIS — F32A Depression, unspecified: Secondary | ICD-10-CM | POA: Insufficient documentation

## 2021-08-14 DIAGNOSIS — Z6841 Body Mass Index (BMI) 40.0 and over, adult: Secondary | ICD-10-CM | POA: Diagnosis not present

## 2021-08-14 DIAGNOSIS — F3289 Other specified depressive episodes: Secondary | ICD-10-CM

## 2021-08-14 MED ORDER — VITAMIN D (ERGOCALCIFEROL) 1.25 MG (50000 UNIT) PO CAPS: 50000.0000 [IU] | ORAL_CAPSULE | ORAL | 0 refills | Status: DC

## 2021-08-14 MED ORDER — BUPROPION HCL ER (SR) 200 MG PO TB12: 200.0000 mg | ORAL_TABLET | Freq: Every day | ORAL | 0 refills | Status: DC

## 2021-08-22 ENCOUNTER — Ambulatory Visit: Payer: Medicare Other | Admitting: Pulmonary Disease

## 2021-08-28 NOTE — Progress Notes (Signed)
Chief Complaint:   OBESITY Trayon is here to discuss his progress with his obesity treatment plan along with follow-up of his obesity related diagnoses. Rand is on keeping a food journal and adhering to recommended goals of 1800 calories and 120 grams of protein daily and states he is following his eating plan approximately 70% of the time. Huriel states he is doing 0 minutes 0 times per week.  Today's visit was #: 14 Starting weight: 412 lbs Starting date: 08/21/2020 Today's weight: 408 lbs Today's date: 08/14/2021 Total lbs lost to date: 4 Total lbs lost since last in-office visit: 0  Interim History: Eulon has been trying to portion control/smarter choices as he hasn't been able to journal as much. He is trying to increase his protein.   Subjective:   1. Vitamin D deficiency Clanton notes significant fatigue, and her Vitamin D level is worsening. I discussed labs with the patient today.   2. Other depression with emotional eating Caedon notes increased fatigue on his days off. His mood is a bit low.   Assessment/Plan:   1. Vitamin D deficiency Low Vitamin D level contributes to fatigue and are associated with obesity, breast, and colon cancer. Neyland agreed to start prescription Vitamin D 50,000 IU every week with no refills. He will follow-up for routine testing of Vitamin D, at least 2-3 times per year to avoid over-replacement.  - Vitamin D, Ergocalciferol, (DRISDOL) 1.25 MG (50000 UNIT) CAPS capsule; Take 1 capsule (50,000 Units total) by mouth every 7 (seven) days.  Dispense: 5 capsule; Refill: 0  2. Other depression with emotional eating Damarie agreed to increase Wellbutrin SR to 200 mg q AM, with no refills. Behavior modification techniques were discussed today to help Srihith deal with his emotional/non-hunger eating behaviors.  Orders and follow up as documented in patient record.   - buPROPion (WELLBUTRIN SR) 200 MG 12 hr tablet; Take 1 tablet (200 mg total) by  mouth daily.  Dispense: 30 tablet; Refill: 0  3. Obesity, Current BMI 57.0 Jedi is currently in the action stage of change. As such, his goal is to continue with weight loss efforts. He has agreed to keeping a food journal and adhering to recommended goals of 1800 calories and 120 grams of protein daily.   Behavioral modification strategies: increasing lean protein intake.  Kiev has agreed to follow-up with our clinic in 3 to 4 weeks. He was informed of the importance of frequent follow-up visits to maximize his success with intensive lifestyle modifications for his multiple health conditions.   Objective:   Blood pressure 135/76, pulse 81, temperature 97.9 F (36.6 C), height 5\' 11"  (1.803 m), weight (!) 408 lb (185.1 kg), SpO2 97 %. Body mass index is 56.9 kg/m.  General: Cooperative, alert, well developed, in no acute distress. HEENT: Conjunctivae and lids unremarkable. Cardiovascular: Regular rhythm.  Lungs: Normal work of breathing. Neurologic: No focal deficits.   Lab Results  Component Value Date   CREATININE 0.71 (L) 07/17/2021   BUN 14 07/17/2021   NA 143 07/17/2021   K 4.1 07/17/2021   CL 103 07/17/2021   CO2 24 07/17/2021   Lab Results  Component Value Date   ALT 26 07/17/2021   AST 15 07/17/2021   ALKPHOS 115 07/17/2021   BILITOT 0.4 07/17/2021   Lab Results  Component Value Date   HGBA1C 5.0 07/17/2021   HGBA1C 5.0 08/21/2020   Lab Results  Component Value Date   INSULIN 34.8 (H) 07/17/2021  INSULIN 23.4 08/21/2020   Lab Results  Component Value Date   TSH 2.870 08/21/2020   Lab Results  Component Value Date   CHOL 184 08/21/2020   HDL 39 (L) 08/21/2020   LDLCALC 126 (H) 08/21/2020   TRIG 104 08/21/2020   Lab Results  Component Value Date   VD25OH 10.5 (L) 07/17/2021   VD25OH 12.2 (L) 08/21/2020   Lab Results  Component Value Date   WBC 9.3 07/02/2021   HGB 13.7 07/02/2021   HCT 41.8 07/02/2021   MCV 86.5 07/02/2021   PLT 238  07/02/2021   No results found for: IRON, TIBC, FERRITIN  Obesity Behavioral Intervention:   Approximately 15 minutes were spent on the discussion below.  ASK: We discussed the diagnosis of obesity with [name] today and Sadao agreed to give [name] permission to discuss obesity behavioral modification therapy today.  ASSESS: Eisen has the diagnosis of obesity and his BMI today is 60.0. Tiran is in the action stage of change.   ADVISE: Rondell was educated on the multiple health risks of obesity as well as the benefit of weight loss to improve his health. He was advised of the need for long term treatment and the importance of lifestyle modifications to improve his current health and to decrease his risk of future health problems.  AGREE: Multiple dietary modification options and treatment options were discussed and Zacory agreed to follow the recommendations documented in the above note.  ARRANGE: Jaheem was educated on the importance of frequent visits to treat obesity as outlined per CMS and USPSTF guidelines and agreed to schedule his next follow up appointment today.  Attestation Statements:   Reviewed by clinician on day of visit: allergies, medications, problem list, medical history, surgical history, family history, social history, and previous encounter notes.   I, Burt Knack, am acting as transcriptionist for Quillian Quince, MD.  I have reviewed the above documentation for accuracy and completeness, and I agree with the above. -  Quillian Quince, MD

## 2021-09-15 ENCOUNTER — Ambulatory Visit (INDEPENDENT_AMBULATORY_CARE_PROVIDER_SITE_OTHER): Payer: Medicare Other | Admitting: Family Medicine

## 2021-10-09 ENCOUNTER — Ambulatory Visit (INDEPENDENT_AMBULATORY_CARE_PROVIDER_SITE_OTHER): Payer: Medicare Other | Admitting: Family Medicine

## 2021-10-14 ENCOUNTER — Ambulatory Visit (INDEPENDENT_AMBULATORY_CARE_PROVIDER_SITE_OTHER): Payer: Medicare Other | Admitting: Pulmonary Disease

## 2021-10-14 ENCOUNTER — Encounter: Payer: Self-pay | Admitting: Pulmonary Disease

## 2021-10-14 VITALS — BP 130/86 | HR 78 | Temp 97.8°F | Ht 71.0 in | Wt >= 6400 oz

## 2021-10-14 DIAGNOSIS — R0602 Shortness of breath: Secondary | ICD-10-CM | POA: Diagnosis not present

## 2021-10-14 DIAGNOSIS — G4733 Obstructive sleep apnea (adult) (pediatric): Secondary | ICD-10-CM

## 2021-10-14 LAB — PULMONARY FUNCTION TEST
DL/VA % pred: 120 %
DL/VA: 5.79 ml/min/mmHg/L
DLCO cor % pred: 106 %
DLCO cor: 35.33 ml/min/mmHg
DLCO unc % pred: 106 %
DLCO unc: 35.33 ml/min/mmHg
FEF 25-75 Post: 4.72 L/sec
FEF 25-75 Pre: 3.88 L/sec
FEF2575-%Change-Post: 21 %
FEF2575-%Pred-Post: 106 %
FEF2575-%Pred-Pre: 87 %
FEV1-%Change-Post: 8 %
FEV1-%Pred-Post: 81 %
FEV1-%Pred-Pre: 74 %
FEV1-Post: 3.68 L
FEV1-Pre: 3.38 L
FEV1FVC-%Change-Post: -3 %
FEV1FVC-%Pred-Pre: 105 %
FEV6-%Change-Post: 15 %
FEV6-%Pred-Post: 80 %
FEV6-%Pred-Pre: 69 %
FEV6-Post: 4.46 L
FEV6-Pre: 3.85 L
FEV6FVC-%Pred-Post: 101 %
FEV6FVC-%Pred-Pre: 101 %
FVC-%Change-Post: 13 %
FVC-%Pred-Post: 79 %
FVC-%Pred-Pre: 70 %
FVC-Post: 4.46 L
FVC-Pre: 3.93 L
Post FEV1/FVC ratio: 83 %
Post FEV6/FVC ratio: 100 %
Pre FEV1/FVC ratio: 86 %
Pre FEV6/FVC Ratio: 100 %
RV % pred: 129 %
RV: 2.26 L
TLC % pred: 88 %
TLC: 6.29 L

## 2021-10-14 MED ORDER — FLUTICASONE-SALMETEROL 250-50 MCG/ACT IN AEPB: 1.0000 | INHALATION_SPRAY | Freq: Two times a day (BID) | RESPIRATORY_TRACT | 3 refills | Status: DC

## 2021-10-14 NOTE — Patient Instructions (Signed)
Prescription for Advair sent to pharmacy for you -It is always 1 spray twice a day -You may use albuterol up to 4 times a day depending on shortness of breath and wheezing  Continue weight loss efforts  Call us with significant concerns  I will see you back in about 3 months

## 2021-10-14 NOTE — Patient Instructions (Signed)
Full PFT Performed Today  

## 2021-10-14 NOTE — Progress Notes (Signed)
Full PFT Performed Today  

## 2021-10-14 NOTE — Progress Notes (Signed)
Robert Stokes    [id number]    1991/01/27  Primary Care Physician:Pcp, No  Referring Physician: Glenford Bayley, NP [address] [place],  [place] [address]  Chief complaint:   Follow-up for shortness of breath  HPI:  Shortness of breath, past history of asthma  History of obesity hypoventilation He is working on weight loss  Has a lot of musculoskeletal pains and discomfort  He is trying aggressively to lose weight He has excessive daytime sleepiness, insomnia -He does have sleep apnea for which he will be started on auto CPAP -This is managed by Dr. 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   diclofenac (VOLTAREN) 50 MG EC tablet Take 50 mg by mouth daily.   diclofenac Sodium (VOLTAREN) 1 % GEL Apply topically.   fluticasone-salmeterol (ADVAIR DISKUS) 250-50 MCG/ACT AEPB Inhale 1 puff into the lungs in the morning and at bedtime.   triamcinolone (NASACORT) 55 MCG/ACT AERO nasal inhaler Place 2 sprays into the nose daily.   Vitamin D, Ergocalciferol, (DRISDOL) 1.25 MG (50000 UNIT) CAPS capsule Take 1 capsule (50,000 Units total) by mouth every 7 (seven) days.   [DISCONTINUED] meloxicam (MOBIC) 15 MG tablet Take 1 tablet (15 mg total) by mouth daily. Take 1 daily with food. (Patient not taking: Reported on 10/14/2021)   [DISCONTINUED] methylPREDNISolone (MEDROL DOSEPAK) 4 MG TBPK tablet 6 Day Taper Pack. Take as Directed. (Patient not taking: Reported on  10/14/2021)   No facility-administered encounter medications on file as of 10/14/2021.    Allergies as of 10/14/2021   (No Known Allergies)    Past Medical History:  Diagnosis Date   ADD (attention deficit disorder)    ADHD    Asthma    Back pain    Joint pain    Obesity    Sleep apnea    SOB (shortness of breath)    Swelling of lower extremity     Past Surgical History:  Procedure Laterality Date   ADENOIDECTOMY     TONSILLECTOMY      Family History  Problem Relation Age of Onset   Diabetes Mother    Hypertension Mother    Heart failure Mother    Cancer Mother    Depression Mother    Anxiety disorder Mother    Liver cancer Mother    Sleep apnea Mother    Alcoholism Mother    Drug abuse Mother    Cancer Father    Depression Father    Anxiety disorder Father    Sleep apnea Father    Alcoholism Father    Drug abuse Father     Social History   Socioeconomic History   Marital status: Single    Spouse name: Not on file   Number of children: Not on file   Years of education: Not on file   Highest education level: 12th grade  Occupational History   Occupation: DISH WASHER/  STUDENT  Tobacco Use   Smoking status: Never   Smokeless tobacco: Never  Vaping Use   Vaping Use: Never used  Substance and Sexual Activity   Alcohol use: Never   Drug use: No   Sexual activity: Not Currently  Other Topics Concern   Not on file  Social History Narrative   Live with 2 roommates   Right handed   No caffeine    Social Determinants of Health   Financial Resource Strain: Not on file  Food Insecurity: Not on file  Transportation Needs: Not on file  Physical Activity: Not on file  Stress: Not on file  Social Connections: Not on file  Intimate Partner Violence: Not on file    Review of Systems  Constitutional:  Positive for fatigue.  Respiratory:  Positive for shortness of breath.     Vitals:   10/14/21 1348  BP: 130/86  Pulse: 78  Temp: 97.8 F (36.6 C)   SpO2: 92%     Physical Exam Constitutional:      Appearance: He is obese.  HENT:     Head: Normocephalic.     Mouth/Throat:     Mouth: Mucous membranes are moist.  Cardiovascular:     Rate and Rhythm: Normal rate and regular rhythm.     Heart sounds: No murmur heard.    No friction rub.  Pulmonary:     Effort: No respiratory distress.     Breath sounds: No stridor. No wheezing or rhonchi.  Musculoskeletal:     Cervical back: No rigidity or tenderness.  Neurological:     Mental Status: He is alert.  Psychiatric:        Mood and Affect: Mood normal.     Data Reviewed: Report from his sleep study reviewed-read by Dr. Vickey Huger  PFT reviewed with the patient showing mild obstructive disease with significant bronchodilator response, increased residual volume  Assessment:  Obstructive airway disease likely related to asthma  History of obstructive sleep apnea  Obesity hypoventilation -Did not have significant nocturnal desaturations on recent sleep study with CPAP in place  Class III obesity   Plan/Recommendations: Encourage weight loss efforts  Prescription for Advair 250 provided  Albuterol use as needed  Inhaler technique reviewed with the patient today  Weight loss efforts encouraged  Follow-up in about 3 months   Virl Diamond MD [facility] 10/14/2021, 2:20 PM  CC: Glenford Bayley, NP

## 2021-10-16 ENCOUNTER — Ambulatory Visit (INDEPENDENT_AMBULATORY_CARE_PROVIDER_SITE_OTHER): Payer: Medicare Other | Admitting: Family Medicine

## 2021-10-17 ENCOUNTER — Encounter: Payer: Self-pay | Admitting: Pulmonary Disease

## 2021-10-21 ENCOUNTER — Ambulatory Visit (INDEPENDENT_AMBULATORY_CARE_PROVIDER_SITE_OTHER): Payer: Medicare Other | Admitting: Family Medicine

## 2021-10-22 ENCOUNTER — Other Ambulatory Visit: Payer: Self-pay | Admitting: Internal Medicine

## 2021-10-23 LAB — LIPID PANEL
Cholesterol: 182 mg/dL (ref <200)
HDL: 36 mg/dL — ABNORMAL LOW (ref >=40)
LDL Cholesterol (Calc): 119 mg/dL (calc) — ABNORMAL HIGH
Non-HDL Cholesterol (Calc): 146 mg/dL (calc) — ABNORMAL HIGH (ref <130)
Total CHOL/HDL Ratio: 5.1 (calc) — ABNORMAL HIGH (ref <5.0)
Triglycerides: 152 mg/dL — ABNORMAL HIGH (ref <150)

## 2021-10-23 LAB — COMPLETE METABOLIC PANEL WITH GFR
AG Ratio: 1.5 (calc) (ref 1.0–2.5)
ALT: 19 U/L (ref 9–46)
AST: 16 U/L (ref 10–40)
Albumin: 4.3 g/dL (ref 3.6–5.1)
Alkaline phosphatase (APISO): 98 U/L (ref 36–130)
BUN: 14 mg/dL (ref 7–25)
CO2: 27 mmol/L (ref 20–32)
Calcium: 9.2 mg/dL (ref 8.6–10.3)
Chloride: 104 mmol/L (ref 98–110)
Creat: 0.85 mg/dL (ref 0.60–1.26)
Globulin: 2.9 g/dL (calc) (ref 1.9–3.7)
Glucose, Bld: 96 mg/dL (ref 65–99)
Potassium: 4 mmol/L (ref 3.5–5.3)
Sodium: 141 mmol/L (ref 135–146)
Total Bilirubin: 0.5 mg/dL (ref 0.2–1.2)
Total Protein: 7.2 g/dL (ref 6.1–8.1)
eGFR: 118 mL/min/{1.73_m2} (ref >=60)

## 2021-10-23 LAB — CBC
HCT: 42 % (ref 38.5–50.0)
Hemoglobin: 13.4 g/dL (ref 13.2–17.1)
MCH: 27.2 pg (ref 27.0–33.0)
MCHC: 31.9 g/dL — ABNORMAL LOW (ref 32.0–36.0)
MCV: 85.4 fL (ref 80.0–100.0)
MPV: 9.9 fL (ref 7.5–12.5)
Platelets: 265 10*3/uL (ref 140–400)
RBC: 4.92 10*6/uL (ref 4.20–5.80)
RDW: 13.2 % (ref 11.0–15.0)
WBC: 8.9 10*3/uL (ref 3.8–10.8)

## 2021-10-23 LAB — VITAMIN D 25 HYDROXY (VIT D DEFICIENCY, FRACTURES): Vit D, 25-Hydroxy: 17 ng/mL — ABNORMAL LOW (ref 30–100)

## 2021-10-23 LAB — TSH: TSH: 3.24 mIU/L (ref 0.40–4.50)

## 2021-11-05 ENCOUNTER — Encounter (INDEPENDENT_AMBULATORY_CARE_PROVIDER_SITE_OTHER): Payer: Self-pay

## 2021-11-20 ENCOUNTER — Telehealth: Payer: Self-pay | Admitting: Neurology

## 2021-11-20 NOTE — Telephone Encounter (Signed)
Pt was scheduled Initial CPAP visit on 01/15/22. Pt was informed to bring machine and power cord to appt. DME: [facility] Phone: [phone] Fax: [phone] Equipment issued: AirkSense 11 Pt to be schedule between: 12/15/21-02/12/22

## 2021-11-24 ENCOUNTER — Ambulatory Visit: Payer: Medicare Other | Admitting: Neurology

## 2021-11-26 ENCOUNTER — Ambulatory Visit (INDEPENDENT_AMBULATORY_CARE_PROVIDER_SITE_OTHER): Payer: Medicare Other | Admitting: Family Medicine

## 2021-12-09 ENCOUNTER — Other Ambulatory Visit (INDEPENDENT_AMBULATORY_CARE_PROVIDER_SITE_OTHER): Payer: Self-pay | Admitting: Family Medicine

## 2021-12-09 DIAGNOSIS — F3289 Other specified depressive episodes: Secondary | ICD-10-CM

## 2021-12-24 ENCOUNTER — Ambulatory Visit (INDEPENDENT_AMBULATORY_CARE_PROVIDER_SITE_OTHER): Payer: Medicare Other

## 2021-12-24 ENCOUNTER — Ambulatory Visit (INDEPENDENT_AMBULATORY_CARE_PROVIDER_SITE_OTHER): Payer: Medicare Other | Admitting: Podiatry

## 2021-12-24 ENCOUNTER — Encounter: Payer: Self-pay | Admitting: Podiatry

## 2021-12-24 DIAGNOSIS — M779 Enthesopathy, unspecified: Secondary | ICD-10-CM

## 2021-12-24 DIAGNOSIS — M778 Other enthesopathies, not elsewhere classified: Secondary | ICD-10-CM | POA: Diagnosis not present

## 2021-12-24 DIAGNOSIS — M79672 Pain in left foot: Secondary | ICD-10-CM

## 2021-12-24 MED ORDER — METHYLPREDNISOLONE 4 MG PO TBPK: ORAL_TABLET | ORAL | 0 refills | Status: DC

## 2021-12-24 NOTE — Progress Notes (Signed)
   Chief Complaint  Patient presents with   Foot Pain    Patient is here for  left foot pain.patient states that he still has pain.    HPI: 31 y.o. male morbid obesity presenting today for new complaint of pain and tenderness associated to the left lateral forefoot.  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  Over the last several months he has developed left forefoot pain.  He denies a history of injury.  Currently today he is wearing a cam boot.  He presents for further treatment and evaluation  Past Medical History:  Diagnosis Date   ADD (attention deficit disorder)    ADHD    Asthma    Back pain    Joint pain    Obesity    Sleep apnea    SOB (shortness of breath)    Swelling of lower extremity     Past Surgical History:  Procedure Laterality Date   ADENOIDECTOMY     TONSILLECTOMY      No Known Allergies   Physical Exam: General: The patient is alert and oriented x3 in no acute distress.  Dermatology: Skin is warm, dry and supple bilateral lower extremities. Negative for open lesions or macerations.  Vascular: Palpable pedal pulses bilaterally. Capillary refill within normal limits.  Negative for any significant edema or erythema  Neurological: Light touch and protective threshold grossly intact  Musculoskeletal Exam: No pedal deformities noted.  Pain on palpation noted to the fourth and fifth MTP joints of the left foot  Radiographic Exam:  Normal osseous mineralization. Joint spaces preserved. No fracture/dislocation/boney destruction.    Assessment: 1.  Fourth and fifth MTP capsulitis left   Plan of Care:  1. Patient evaluated. X-Rays reviewed.  2.  Patient declined cortisone injection 3.  Continue weightbearing in the cam boot x4 weeks.  After that the patient may transition out of the cam boot into good supportive sneakers in tennis shoes 4.  Prescription for Medrol Dosepak 5.  Patient currently  takes anti-inflammatory from [facility] for back pain.  Resume after completion of the Dosepak 6.  Advised against going barefoot 7.  Note for work was provided today to reduce his hours.  No more than 32 hours/week.   8.  Return to clinic as needed      Edrick Kins, DPM [facility]  Dr. Edrick Kins, DPM    [address], [place] [address]                Office [phone]  Fax [phone]

## 2021-12-26 ENCOUNTER — Other Ambulatory Visit: Payer: Self-pay | Admitting: Podiatry

## 2021-12-26 DIAGNOSIS — M779 Enthesopathy, unspecified: Secondary | ICD-10-CM

## 2022-01-13 NOTE — Progress Notes (Deleted)
PATIENT: Robert Stokes DOB: 1990/07/20  REASON FOR VISIT: follow up HISTORY FROM: patient  No chief complaint on file.    HISTORY OF PRESENT ILLNESS:  01/13/22 ALL:  Robert Stokes is a 31 y.o. male here today for follow up for OSA on CPAP.  He has received a new CPAP machine.   He is followed by [facility].   HISTORY: (copied from Dr Dohmeier's previous note)  Robert Stokes is a 31 y.o. male patient and seen here  in a RV  on 05/26/2021 in a RV: CD I have the pleasure of meeting today with Robert Stokes on 26 May 2021.  The patient underwent last summer a sleep study results which confirmed the presence of sleep apnea .   Split-night study followed his abnormal baseline study which has also shown low had low oxygen levels.  He also needed to have 2 pillows or to be a slight bit elevated to sleep comfortably and he was known to snore very loudly.  We invited him therefore back for a split-night study as he is at high risk of central apneas.  BMI was 57.1, neck circumference 22 inches.  And without CPAP his baseline was at an AHI of 20.3 in the lab RDI 23.3, rest REM sleep AHI was 75/h this and the nadir of 59% oxygenation was 142 minutes below normal oxygenation with the risk factors for sleep apnea that made it necessary to use positive airway pressure.  The patient was doing well at 12 cm water pressure with a reduction of the AHI of 0.0 but no REM sleep was observed.  REM sleep had occurred at 8 and 10 cm water pressure.  He was given an F30 I medium sized facial fullface mask here in the lab.  And I had ordered an ONO while on CPAP. He reports no ONO took place yet-    it took a while for him to be given access to his CPAP machine and he was set up on 02-25-2021 with a lunar machine.  The patient has used the machine over the last 30 days 20 out of 30 days percent compliance of 66.7%, the days with usage over 4 hours of 43.3% 13 out of 30 days.  The average leak is  13.4 L/min.  However his average AHI is 0.7/h and that seems to be indicating that the machine is working for him when used.  Leakage was really at peak x3 out of 30 days and he may have had dislodged the face from his mom mask.  The 95th percentile pressure is around 10 cm of water, Epworth sleepiness score was today endorsed at 9 fatigue severity at 45 points.   REVIEW OF SYSTEMS: Out of a complete 14 system review of symptoms, the patient complains only of the following symptoms, and all other reviewed systems are negative.  ESS:  ALLERGIES: No Known Allergies  HOME MEDICATIONS: Outpatient Medications Prior to Visit  Medication Sig Dispense Refill   albuterol (VENTOLIN HFA) 108 (90 Base) MCG/ACT inhaler INHALE 2 PUFFS INTO THE LUNGS EVERY 6 HOURS AS NEEDED FOR WHEEZING OR SHORTNESS OF BREATH 20.1 g 3   buPROPion (WELLBUTRIN SR) 200 MG 12 hr tablet Take 1 tablet (200 mg total) by mouth daily. 30 tablet 0   diclofenac (VOLTAREN) 50 MG EC tablet Take 50 mg by mouth daily.     diclofenac Sodium (VOLTAREN) 1 % GEL Apply topically.     fluticasone-salmeterol (ADVAIR  DISKUS) 250-50 MCG/ACT AEPB Inhale 1 puff into the lungs in the morning and at bedtime. 60 each 3   methylPREDNISolone (MEDROL DOSEPAK) 4 MG TBPK tablet 6 day dose pack - take as directed 21 tablet 0   triamcinolone (NASACORT) 55 MCG/ACT AERO nasal inhaler Place 2 sprays into the nose daily. 1 each 12   Vitamin D, Ergocalciferol, (DRISDOL) 1.25 MG (50000 UNIT) CAPS capsule Take 1 capsule (50,000 Units total) by mouth every 7 (seven) days. 5 capsule 0   No facility-administered medications prior to visit.    PAST MEDICAL HISTORY: Past Medical History:  Diagnosis Date   ADD (attention deficit disorder)    ADHD    Asthma    Back pain    Joint pain    Obesity    Sleep apnea    SOB (shortness of breath)    Swelling of lower extremity     PAST SURGICAL HISTORY: Past Surgical History:  Procedure Laterality Date    ADENOIDECTOMY     TONSILLECTOMY      FAMILY HISTORY: Family History  Problem Relation Age of Onset   Diabetes Mother    Hypertension Mother    Heart failure Mother    Cancer Mother    Depression Mother    Anxiety disorder Mother    Liver cancer Mother    Sleep apnea Mother    Alcoholism Mother    Drug abuse Mother    Cancer Father    Depression Father    Anxiety disorder Father    Sleep apnea Father    Alcoholism Father    Drug abuse Father     SOCIAL HISTORY: Social History   Socioeconomic History   Marital status: Single    Spouse name: Not on file   Number of children: Not on file   Years of education: Not on file   Highest education level: 12th grade  Occupational History   Occupation: DISH WASHER/ STUDENT  Tobacco Use   Smoking status: Never   Smokeless tobacco: Never  Vaping Use   Vaping Use: Never used  Substance and Sexual Activity   Alcohol use: Never   Drug use: No   Sexual activity: Not Currently  Other Topics Concern   Not on file  Social History Narrative   Live with 2 roommates   Right handed   No caffeine    Social Determinants of Health   Financial Resource Strain: Not on file  Food Insecurity: Not on file  Transportation Needs: Not on file  Physical Activity: Not on file  Stress: Not on file  Social Connections: Not on file  Intimate Partner Violence: Not on file     PHYSICAL EXAM  There were no vitals filed for this visit. There is no height or weight on file to calculate BMI.  Generalized: Well developed, in no acute distress  Cardiology: normal rate and rhythm, no murmur noted Respiratory: clear to auscultation bilaterally  Neurological examination  Mentation: Alert oriented to time, place, history taking. Follows all commands speech and language fluent Cranial nerve II-XII: Pupils were equal round reactive to light. Extraocular movements were full, visual field were full  Motor: The motor testing reveals 5 over 5 strength  of all 4 extremities. Good symmetric motor tone is noted throughout.  Gait and station: Gait is normal.    DIAGNOSTIC DATA (LABS, IMAGING, TESTING) - I reviewed patient records, labs, notes, testing and imaging myself where available.      No data to display  Lab Results  Component Value Date   WBC 8.9 10/22/2021   HGB 13.4 10/22/2021   HCT 42.0 10/22/2021   MCV 85.4 10/22/2021   PLT 265 10/22/2021      Component Value Date/Time   NA 141 10/22/2021 0000   NA 143 07/17/2021 0829   K 4.0 10/22/2021 0000   CL 104 10/22/2021 0000   CO2 27 10/22/2021 0000   GLUCOSE 96 10/22/2021 0000   BUN 14 10/22/2021 0000   BUN 14 07/17/2021 0829   CREATININE 0.85 10/22/2021 0000   CALCIUM 9.2 10/22/2021 0000   PROT 7.2 10/22/2021 0000   PROT 7.0 07/17/2021 0829   ALBUMIN 4.4 07/17/2021 0829   AST 16 10/22/2021 0000   ALT 19 10/22/2021 0000   ALKPHOS 115 07/17/2021 0829   BILITOT 0.5 10/22/2021 0000   BILITOT 0.4 07/17/2021 0829   GFRNONAA >60 07/02/2021 1223   GFRAA >60 08/23/2019 1237   Lab Results  Component Value Date   CHOL 182 10/22/2021   HDL 36 (L) 10/22/2021   LDLCALC 119 (H) 10/22/2021   TRIG 152 (H) 10/22/2021   CHOLHDL 5.1 (H) 10/22/2021   Lab Results  Component Value Date   HGBA1C 5.0 07/17/2021   No results found for: "VITAMINB12" Lab Results  Component Value Date   TSH 3.24 10/22/2021     ASSESSMENT AND PLAN 31 y.o. year old male  has a past medical history of ADD (attention deficit disorder), ADHD, Asthma, Back pain, Joint pain, Obesity, Sleep apnea, SOB (shortness of breath), and Swelling of lower extremity. here with     ICD-10-CM   1. OSA on CPAP  G47.33         Robert Stokes is doing well on CPAP therapy. Compliance report reveals ***. *** was encouraged to continue using CPAP nightly and for greater than 4 hours each night. We will update supply orders as indicated. Risks of untreated sleep apnea review and education materials  provided. Healthy lifestyle habits encouraged. *** will follow up in ***, sooner if needed. *** verbalizes understanding and agreement with this plan.    No orders of the defined types were placed in this encounter.    No orders of the defined types were placed in this encounter.     Shawnie Dapper, FNP-C 01/13/2022, 2:23 PM [facility] [address], [address] [place], [place] [address] [phone]

## 2022-01-14 ENCOUNTER — Ambulatory Visit: Payer: Medicare Other | Admitting: Pulmonary Disease

## 2022-01-15 ENCOUNTER — Encounter: Payer: Self-pay | Admitting: Family Medicine

## 2022-01-15 ENCOUNTER — Encounter: Payer: Medicare Other | Admitting: Family Medicine

## 2022-01-15 DIAGNOSIS — G4733 Obstructive sleep apnea (adult) (pediatric): Secondary | ICD-10-CM

## 2022-02-28 ENCOUNTER — Other Ambulatory Visit: Payer: Self-pay

## 2022-02-28 ENCOUNTER — Emergency Department (HOSPITAL_COMMUNITY)
Admission: EM | Admit: 2022-02-28 | Discharge: 2022-03-01 | Disposition: A | Discharge status: Home / Self Care | Payer: Medicare Other | Attending: Emergency Medicine | Admitting: Emergency Medicine

## 2022-02-28 DIAGNOSIS — I10 Essential (primary) hypertension: Secondary | ICD-10-CM | POA: Diagnosis not present

## 2022-02-28 DIAGNOSIS — I83812 Varicose veins of left lower extremities with pain: Secondary | ICD-10-CM | POA: Diagnosis not present

## 2022-02-28 DIAGNOSIS — Z7951 Long term (current) use of inhaled steroids: Secondary | ICD-10-CM | POA: Insufficient documentation

## 2022-02-28 DIAGNOSIS — J45909 Unspecified asthma, uncomplicated: Secondary | ICD-10-CM | POA: Diagnosis not present

## 2022-02-28 DIAGNOSIS — M5432 Sciatica, left side: Secondary | ICD-10-CM | POA: Insufficient documentation

## 2022-02-28 DIAGNOSIS — M79605 Pain in left leg: Secondary | ICD-10-CM | POA: Diagnosis present

## 2022-02-28 NOTE — ED Triage Notes (Signed)
Pt reporting left lateral thigh varicose veins that have been painful for about a week.

## 2022-02-28 NOTE — ED Provider Triage Note (Cosign Needed Addendum)
Emergency Medicine Provider Triage Evaluation Note  Robert Stokes , a 31 y.o. male  was evaluated in triage.  Pt complains of left leg pain.  Patient noted varicosities on the outer aspect of his left thigh about 2 months ago.  He also has a small area on the right thigh.  Over the past several days he has noted some cramping pain in the thigh that goes down to the knee.  It does not stop him from ambulating.  No chest pain or shortness of breath.  No history of blood clots.  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  Review of Systems  Positive: Leg pain varicose veins Negative: Chest pain  Physical Exam  BP (!) 185/97 (BP Location: Right Arm)   Pulse 81   Temp 98.2 F (36.8 C) (Oral)   Resp 18   Ht 5\' 11"  (1.803 m)   Wt (!) 186 kg   SpO2 97%   BMI 57.18 kg/m  Gen:   Awake, no distress   Resp:  Normal effort  MSK:   Moves extremities without difficulty  Other:  Soft area of varicosities noted left lateral thigh, no surrounding edema erythema or tenderness.  Medical Decision Making  Medically screening exam initiated at 9:38 PM.  Appropriate orders placed.  Robert Stokes was informed that the remainder of the evaluation will be completed by another provider, this initial triage assessment does not replace that evaluation, and the importance of remaining in the ED until their evaluation is complete.      [name], PA-C 02/28/22 2140

## 2022-03-01 DIAGNOSIS — I83812 Varicose veins of left lower extremities with pain: Secondary | ICD-10-CM | POA: Diagnosis not present

## 2022-03-01 MED ORDER — PREDNISONE 20 MG PO TABS
60.0000 mg | ORAL_TABLET | Freq: Once | ORAL | Status: AC
  Administered 2022-03-01: 60 mg via ORAL
  Filled 2022-03-01: qty 3

## 2022-03-01 MED ORDER — PREDNISONE 20 MG PO TABS: 40.0000 mg | ORAL_TABLET | Freq: Every day | ORAL | 0 refills | Status: DC

## 2022-03-01 NOTE — ED Provider Notes (Signed)
[facility]-EMERGENCY DEPT Provider Note   CSN: [id number] Arrival date & time: 02/28/22  2119     History  Chief Complaint  Patient presents with   Leg Pain    Robert Stokes is a 31 y.o. male.  31 y/o male with history of asthma, obesity, OSA, and ADD presents to the ED for LLE pain. States that he has had pain to his L lateral leg x 2-3 months that has been intermittent and gradually worsening. 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 Describes the pain to be burning, "like there is a hot stove plate" on his leg. He does get some moderate improvement with NSAIDs, but pain recurs when the medication wears off. He has noted some veins around the area of his pain on the LLE. No peripheral edema, erythema, fevers, CP, SOB, hx of DVT/PE.  The history is provided by the patient. No language interpreter was used.  Leg Pain      Home Medications Prior to Admission medications   Medication Sig Start Date End Date Taking? Authorizing Provider  predniSONE (DELTASONE) 20 MG tablet Take 2 tablets (40 mg total) by mouth daily. Take 40 mg by mouth daily for 3 days, then 20mg  by mouth daily for 3 days, then 10mg  daily for 3 days 03/01/22  Yes , PA-C  albuterol (VENTOLIN HFA) 108 (90 Base) MCG/ACT inhaler INHALE 2 PUFFS INTO THE LUNGS EVERY 6 HOURS AS NEEDED FOR WHEEZING OR SHORTNESS OF BREATH 07/07/21   Antony Madura, NP  buPROPion (WELLBUTRIN SR) 200 MG 12 hr tablet Take 1 tablet (200 mg total) by mouth daily. 08/14/21   Glenford Bayley D, MD  diclofenac (VOLTAREN) 50 MG EC tablet Take 50 mg by mouth daily.    [provider]  diclofenac Sodium (VOLTAREN) 1 % GEL Apply topically. 05/28/21   [provider]  fluticasone-salmeterol (ADVAIR DISKUS) 250-50 MCG/ACT AEPB Inhale 1 puff into the lungs in the morning and at bedtime. 10/14/21   07/28/21, MD  triamcinolone (NASACORT) 55 MCG/ACT AERO nasal inhaler Place  2 sprays into the nose daily. 10/24/20   Dohmeier, Tomma Lightning, MD  Vitamin D, Ergocalciferol, (DRISDOL) 1.25 MG (50000 UNIT) CAPS capsule Take 1 capsule (50,000 Units total) by mouth every 7 (seven) days. 08/14/21   Porfirio Mylar, MD      Allergies    Patient has no known allergies.    Review of Systems   Review of Systems Ten systems reviewed and are negative for acute change, except as noted in the HPI.    Physical Exam Updated Vital Signs BP (!) 145/93 (BP Location: Right Arm)   Pulse 76   Temp (!) 97.5 F (36.4 C) (Oral)   Resp (!) 26   Ht 5\' 11"  (1.803 m)   Wt (!) 186 kg   SpO2 95%   BMI 57.18 kg/m   Physical Exam Vitals and nursing note reviewed.  Constitutional:      General: He is not in acute distress.    Appearance: He is well-developed. He is not diaphoretic.     Comments: Nontoxic appearing and in NAD. Obese male.  HENT:     Head: Normocephalic and atraumatic.  Eyes:     General: No scleral icterus.    Conjunctiva/sclera: Conjunctivae normal.  Pulmonary:     Effort: Pulmonary effort is normal. No respiratory distress.     Comments: Respirations even and unlabored Musculoskeletal:  General: Normal range of motion.     Cervical back: Normal range of motion.  Skin:    General: Skin is warm and dry.     Coloration: Skin is not pale.     Findings: No erythema or rash.     Comments: Varicosities to the lateral LLE. No associated induration, erythema, heat to touch.  Neurological:     Mental Status: He is alert and oriented to person, place, and time.     Coordination: Coordination normal.     Comments: Ambulatory in the department w/o difficulty.  Psychiatric:        Behavior: Behavior normal.       ED Results / Procedures / Treatments   Labs (all labs ordered are listed, but only abnormal results are displayed) Labs Reviewed - No data to display  EKG None  Radiology No results found.  Procedures Procedures    Medications Ordered in  ED Medications  predniSONE (DELTASONE) tablet 60 mg (60 mg Oral Given 03/01/22 0140)    ED Course/ Medical Decision Making/ A&P                           Medical Decision Making Risk Prescription drug management.   This patient presents to the ED for concern of LLE pain, this involves an extensive number of treatment options, and is a complaint that carries with it a high risk of complications and morbidity.  The differential diagnosis includes varicose veins vs cellulitis vs MSK sprain/strain vs DVT vs sciatica   Co morbidities that complicate the patient evaluation  Obesity  OSA Asthma   Additional history obtained:  External records from outside source obtained and reviewed including prior glucose trending to ensure no DM hx   Cardiac Monitoring:  The patient was maintained on a cardiac monitor.  I personally viewed and interpreted the cardiac monitored which showed an underlying rhythm of: NSR   Medicines ordered and prescription drug management:  I ordered medication including prednisone for pain/presumed sciatcia  Reevaluation of the patient after these medicines showed that the patient stayed the same I have reviewed the patients home medicines and have made adjustments as needed   Test Considered:  Venous duplex - however physical exam findings not c/w DVT. His Well's DVT score is 0.   Problem List / ED Course:  Patient was left lower extremity pain x 2 to 3 months.  He is neurovascularly intact on exam.  Patient stands for long hours as a Public affairs consultant.  Question musculoskeletal etiology, though history and burning description of pain seems more radicular in nature.  Suspect symptoms related to sciatica rather than varicosities present to the patient's left lateral leg. There is no evidence of associated cellulitis.  No erythema, heat to touch to the left lower extremity.  Also no crepitus. There is no tenderness or pain along the medial aspect of the bilateral  lower extremities.  No palpable cords.  Doubt DVT. Plan to trial patient on course of prednisone for symptomatic relief.  Will also refer to orthopedics for follow-up.   Reevaluation:  After the interventions noted above, I reevaluated the patient and found that they have : remained stable    Social Determinants of Health:  Insured patient   Dispostion:  After consideration of the diagnostic results and the patients response to treatment, I feel that the patent would benefit from outpatient prednisone taper, Orthopedic referral. Discussed use of compression stockings when at work. Return  precautions discussed and provided. Patient discharged in stable condition with no unaddressed concerns.          Final Clinical Impression(s) / ED Diagnoses Final diagnoses:  Sciatica, left side  Varicose veins of left lower extremity with pain  Hypertension, unspecified type    Rx / DC Orders ED Discharge Orders          Ordered    Ambulatory referral to Orthopedic Surgery       Comments: LLE sciatica   03/01/22 0131    predniSONE (DELTASONE) 20 MG tablet  Daily        03/01/22 0132              Antony Madura, PA-C 03/01/22 0152    Tilden Fossa, MD 03/01/22 [phone]

## 2022-03-01 NOTE — ED Notes (Signed)
Patient verbalized understanding of discharge instructions and reasons to return to the ED 

## 2022-03-01 NOTE — Discharge Instructions (Addendum)
You have been given a prescription for prednisone.  Take as prescribed for management of presumed sciatica.  We also advise follow-up with an orthopedic specialist.  Wear compression stockings when at work to try and help reduce varicose veins.  Varicose veins may also be improved with weight loss. Continue primary care follow up.  Return for new or concerning symptoms.

## 2022-05-14 IMAGING — DX DG FOOT COMPLETE 3+V*R*
3 series · 3 of 3 positions shown · non-contrast
Comparison: None.

CLINICAL DATA: RIGHT ankle pain

EXAM:
RIGHT FOOT COMPLETE - 3+ VIEW

[foot ap]
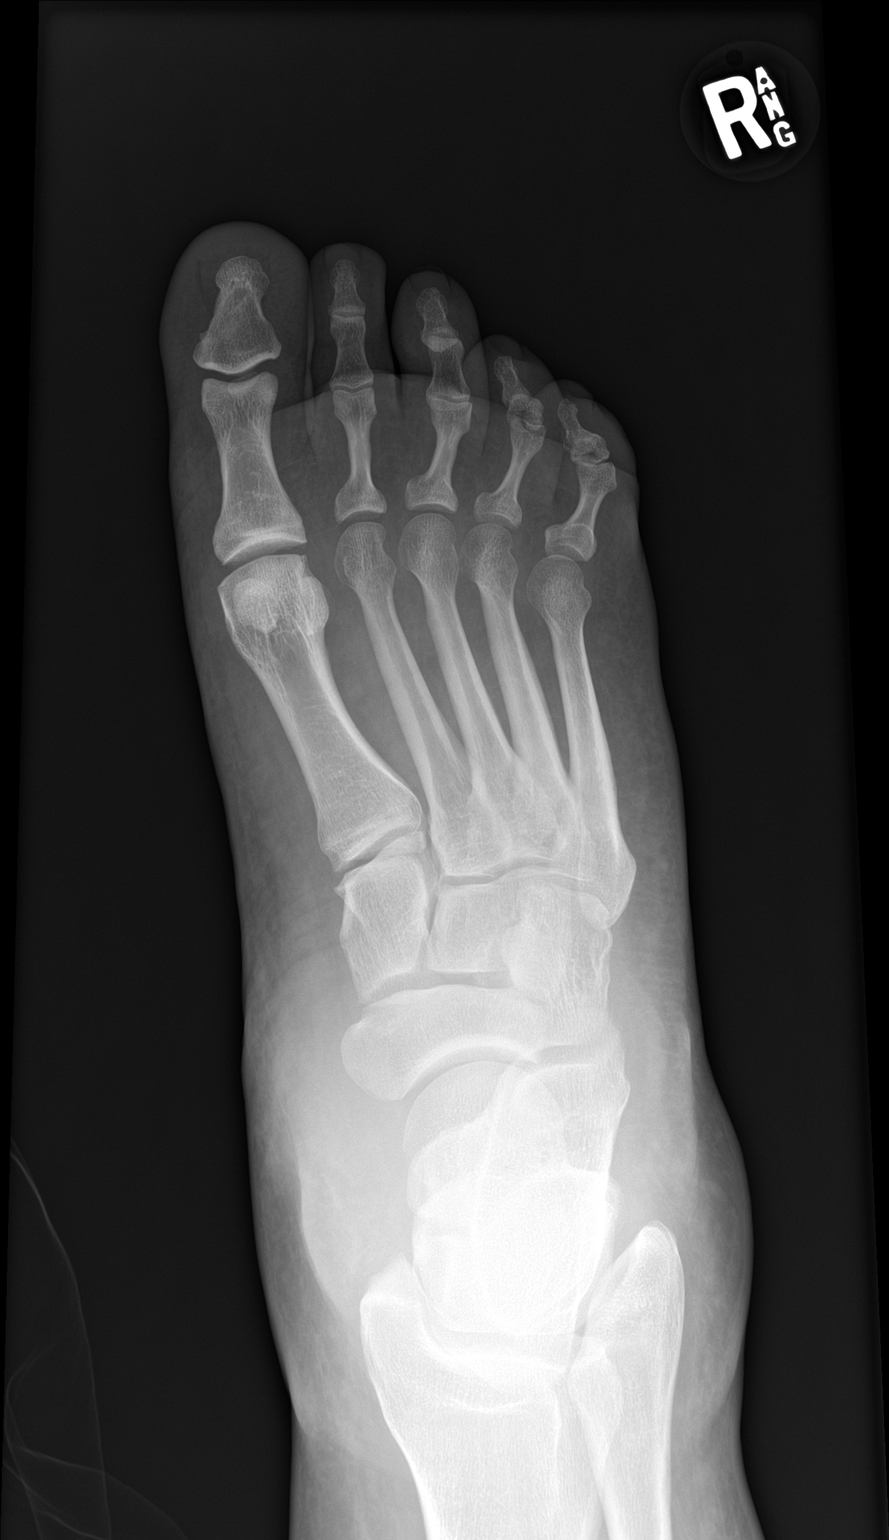

[foot obl]
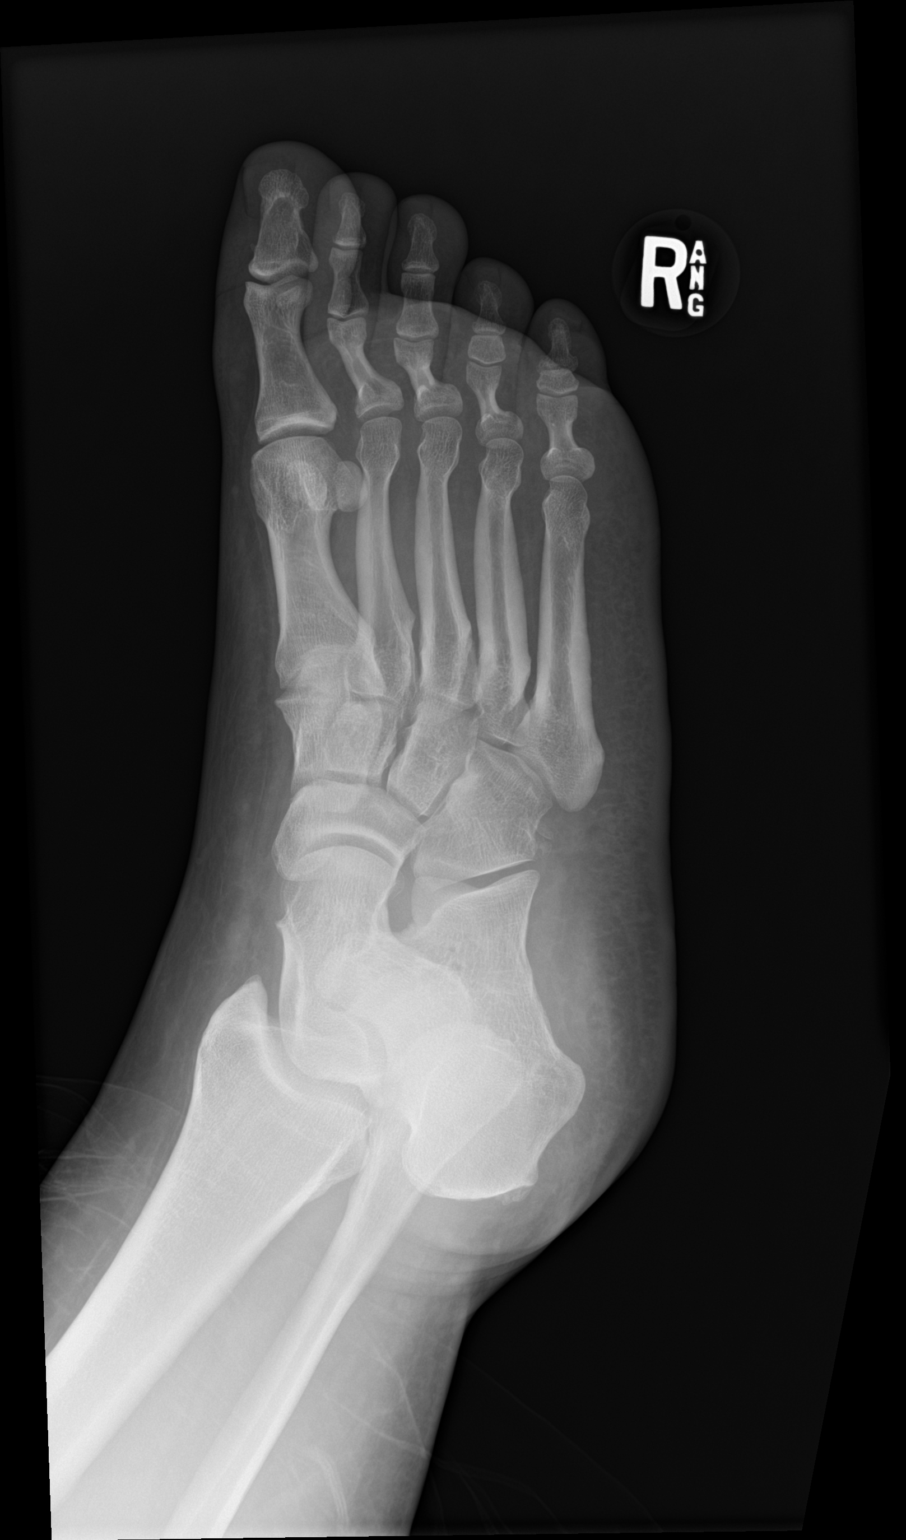

[foot lat]
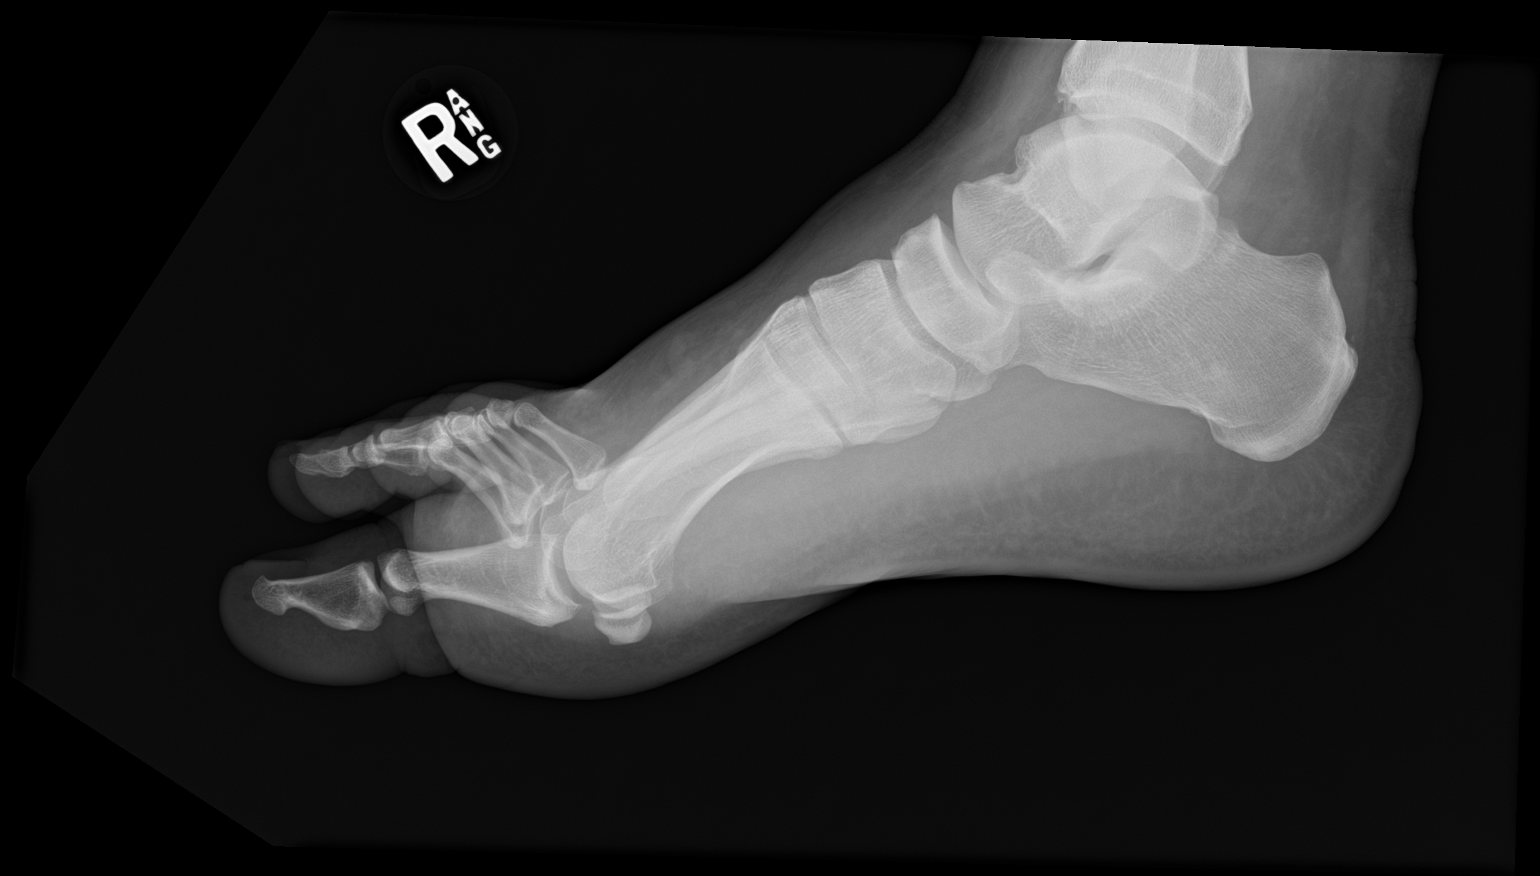

[3 of 3 positions shown; findings below may reference images not displayed]

FINDINGS: No fracture or dislocation of mid foot or forefoot. The phalanges
are normal. The calcaneus is normal. No soft tissue abnormality.
IMPRESSION: No acute osseous abnormality.

## 2022-05-28 ENCOUNTER — Emergency Department (HOSPITAL_COMMUNITY)
Admission: EM | Admit: 2022-05-28 | Discharge: 2022-05-28 | Disposition: A | Discharge status: Home / Self Care | Payer: Worker's Compensation | Attending: Emergency Medicine | Admitting: Emergency Medicine

## 2022-05-28 ENCOUNTER — Encounter (HOSPITAL_COMMUNITY): Payer: Self-pay

## 2022-05-28 ENCOUNTER — Other Ambulatory Visit: Payer: Self-pay

## 2022-05-28 DIAGNOSIS — R202 Paresthesia of skin: Secondary | ICD-10-CM | POA: Diagnosis present

## 2022-05-28 DIAGNOSIS — T754XXA Electrocution, initial encounter: Secondary | ICD-10-CM | POA: Insufficient documentation

## 2022-05-28 MED ORDER — BACITRACIN ZINC 500 UNIT/GM EX OINT
TOPICAL_OINTMENT | Freq: Two times a day (BID) | CUTANEOUS | Status: DC
  Filled 2022-05-28: qty 1.8

## 2022-05-28 NOTE — ED Triage Notes (Signed)
Pt states that he was electrocuted by the dish washer at work around 1030 pm. Pt has a burn to his right hand and is having some numbness.

## 2022-05-28 NOTE — ED Provider Notes (Signed)
  [facility] Emergency Department Provider Note MRN:  [id number]  Arrival date & time: 05/28/22     Chief Complaint   Electric Shock   History of Present Illness   Robert Stokes is a 32 y.o. year-old male presents to the ED with chief complaint of being shocked by the dishwasher at work.  He states that it burned the palm of his right hand.  He reports some tingling right around the wound.  He immediately withdrew his hand.  It wasn't a prolonged shock.  He denies any other symptoms.  He's uncertain of the voltage.  History provided by patient.   Review of Systems  Pertinent positive and negative review of systems noted in HPI.    Physical Exam   Vitals:   05/28/22 0122  BP: (!) 177/106  Pulse: 97  Resp: 20  Temp: 98.1 F (36.7 C)  SpO2: 94%    CONSTITUTIONAL:  well-appearing, NAD NEURO:  Alert and oriented x 3, CN 3-12 grossly intact EYES:  eyes equal and reactive ENT/NECK:  Supple, no stridor  CARDIO:  normal rate, regular Robert, appears well-perfused  PULM:  No respiratory distress, CTAB GI/GU:  non-distended,  MSK/SPINE:  No gross deformities, no edema, moves all extremities  SKIN:  very minor skin burn to palm of right hand and thumb, superficial burn, about the size of a thumb tack   *Additional and/or pertinent findings included in MDM below  Diagnostic and Interventional Summary    EKG Interpretation  Date/Time:    Ventricular Rate:    PR Interval:    QRS Duration:   QT Interval:    QTC Calculation:   R Axis:     Text Interpretation:         Labs Reviewed - No data to display  No orders to display    Medications  bacitracin ointment (has no administration in time range)     Procedures  /  Critical Care Procedures  ED Course and Medical Decision Making  I have reviewed the triage vital signs, the nursing notes, and pertinent available records from the EMR.  Social Determinants Affecting Complexity of  Care: Patient has no clinically significant social determinants affecting this chief complaint..   ED Course:    Medical Decision Making Patient here with minor skin burn from electric shock.  States that his employer made him come for evaluation.  Has some tingling around the burn, but denies any other symptoms.  He has good strength and ROM in the right hand.  No other apparent injuries.  PCP follow-up.  Risk OTC drugs.     Consultants: No consultations were needed in caring for this patient.   Treatment and Plan: Emergency department workup does not suggest an emergent condition requiring admission or immediate intervention beyond  what has been performed at this time. The patient is safe for discharge and has  been instructed to return immediately for worsening symptoms, change in  symptoms or any other concerns    Final Clinical Impressions(s) / ED Diagnoses     ICD-10-CM   1. Electrical shock of hand, initial encounter  [email or web address]       ED Discharge Orders     None         Discharge Instructions Discussed with and Provided to Patient:   Discharge Instructions   None      Montine Circle, PA-C 05/28/22 0138    Orpah Greek, MD 05/28/22 [phone]

## 2022-08-19 ENCOUNTER — Emergency Department (HOSPITAL_BASED_OUTPATIENT_CLINIC_OR_DEPARTMENT_OTHER): Payer: 59

## 2022-08-19 ENCOUNTER — Emergency Department (HOSPITAL_BASED_OUTPATIENT_CLINIC_OR_DEPARTMENT_OTHER)
Admission: EM | Admit: 2022-08-19 | Discharge: 2022-08-19 | Disposition: A | Discharge status: Home / Self Care | Payer: 59 | Attending: Emergency Medicine | Admitting: Emergency Medicine

## 2022-08-19 ENCOUNTER — Encounter (HOSPITAL_BASED_OUTPATIENT_CLINIC_OR_DEPARTMENT_OTHER): Payer: Self-pay

## 2022-08-19 ENCOUNTER — Other Ambulatory Visit: Payer: Self-pay

## 2022-08-19 DIAGNOSIS — R0602 Shortness of breath: Secondary | ICD-10-CM | POA: Diagnosis present

## 2022-08-19 DIAGNOSIS — J45909 Unspecified asthma, uncomplicated: Secondary | ICD-10-CM | POA: Insufficient documentation

## 2022-08-19 DIAGNOSIS — Z7951 Long term (current) use of inhaled steroids: Secondary | ICD-10-CM | POA: Diagnosis not present

## 2022-08-19 LAB — COMPREHENSIVE METABOLIC PANEL
ALT: 36 U/L (ref 0–44)
AST: 22 U/L (ref 15–41)
Albumin: 3.9 g/dL (ref 3.5–5.0)
Alkaline Phosphatase: 87 U/L (ref 38–126)
Anion gap: 8 (ref 5–15)
BUN: 13 mg/dL (ref 6–20)
CO2: 27 mmol/L (ref 22–32)
Calcium: 8.7 mg/dL — ABNORMAL LOW (ref 8.9–10.3)
Chloride: 101 mmol/L (ref 98–111)
Creatinine, Ser: 0.78 mg/dL (ref 0.61–1.24)
GFR, Estimated: >60 mL/min (ref >60)
Glucose, Bld: 103 mg/dL — ABNORMAL HIGH (ref 70–99)
Potassium: 3.4 mmol/L — ABNORMAL LOW (ref 3.5–5.1)
Sodium: 136 mmol/L (ref 135–145)
Total Bilirubin: 0.8 mg/dL (ref 0.3–1.2)
Total Protein: 7.8 g/dL (ref 6.5–8.1)

## 2022-08-19 LAB — CBC
HCT: 41.3 % (ref 39.0–52.0)
Hemoglobin: 12.8 g/dL — ABNORMAL LOW (ref 13.0–17.0)
MCH: 26.6 pg (ref 26.0–34.0)
MCHC: 31 g/dL (ref 30.0–36.0)
MCV: 85.9 fL (ref 80.0–100.0)
Platelets: 201 10*3/uL (ref 150–400)
RBC: 4.81 MIL/uL (ref 4.22–5.81)
RDW: 14.1 % (ref 11.5–15.5)
WBC: 6.4 10*3/uL (ref 4.0–10.5)
nRBC: 0 % (ref 0.0–0.2)

## 2022-08-19 LAB — D-DIMER, QUANTITATIVE: D-Dimer, Quant: 0.38 ug/mL-FEU (ref 0.00–0.50)

## 2022-08-19 LAB — TROPONIN I (HIGH SENSITIVITY): Troponin I (High Sensitivity): 3 ng/L (ref <18)

## 2022-08-19 MED ORDER — ALBUTEROL SULFATE HFA 108 (90 BASE) MCG/ACT IN AERS: 2.0000 | INHALATION_SPRAY | RESPIRATORY_TRACT | 0 refills | Status: AC | PRN

## 2022-08-19 MED ORDER — ACETAMINOPHEN 500 MG PO TABS
1000.0000 mg | ORAL_TABLET | Freq: Once | ORAL | Status: AC
  Administered 2022-08-19: 1000 mg via ORAL
  Filled 2022-08-19: qty 2

## 2022-08-19 MED ORDER — IPRATROPIUM-ALBUTEROL 0.5-2.5 (3) MG/3ML IN SOLN
3.0000 mL | RESPIRATORY_TRACT | Status: AC
  Administered 2022-08-19: 3 mL via RESPIRATORY_TRACT
  Filled 2022-08-19: qty 3

## 2022-08-19 NOTE — Discharge Instructions (Addendum)
Please follow-up with your primary care doctor.  I have also given you the information for a pulmonologist to follow up with.  am glad you are feeling much improved.  Since some degree of your symptoms were likely musculoskeletal I do recommend that you take 1000 mg of Tylenol every 6 hours for 3-4 doses and see how your symptoms are.  Warm compresses, gentle massage and Epsom salt soaks are helpful for muscle pain. Continue to pursue weight loss strategies.  You may talk to your primary care doctor about this as they may be able to help you with this. As we discussed, please feel free to return the emergency room for any new or concerning symptoms.

## 2022-08-19 NOTE — ED Notes (Signed)
Discharge instructions reviewed with patient. Patient verbalizes understanding, no further questions at this time. Medications/prescriptions and follow up information provided. No acute distress noted at time of departure.  

## 2022-08-19 NOTE — ED Provider Notes (Signed)
[facility] Provider Note   CSN: [id number] Arrival date & time: 08/19/22  1528     History  Chief Complaint  Patient presents with   Shortness of Breath    Robert Stokes is a 32 y.o. male.   Shortness of Breath  Patient is a 32 year old male with a past medical history significant for obesity hypoventilation syndrome, hyperglycemia, shortness of breath on exertion, obesity, OSA on CPAP, depression, asthma, vitamin D deficiency  Patient states that around 8 AM this morning he sneezed and then immediately after experienced sharp right-sided chest pain and became very short of breath.  He states the pain was sharp and radiating through his right chest and into his right shoulder no nausea or vomiting.  He denies any dyspnea currently.  He states his symptoms have significantly improved.  He also states that he has had some left lower extremity swelling over the past 2 days no injury to his left leg.  It seems to be around the area of the calf.  He states he feels somewhat more short of breath than usual.  He has been seen by pulmonologist and was told that he had mild asthma and it appears from his notes that he was also thought to have a degree of hypoventilation syndrome related to his obesity.  No recent surgeries, hospitalization, long travel, hemoptysis, estrogen containing OCP, cancer history.  No history of PE or VTE.     Home Medications Prior to Admission medications   Medication Sig Start Date End Date Taking? Authorizing Provider  albuterol (VENTOLIN HFA) 108 (90 Base) MCG/ACT inhaler Inhale 2 puffs into the lungs every 4 (four) hours as needed for wheezing or shortness of breath. 08/19/22  Yes Andren Bethea S, PA  albuterol (VENTOLIN HFA) 108 (90 Base) MCG/ACT inhaler INHALE 2 PUFFS INTO THE LUNGS EVERY 6 HOURS AS NEEDED FOR WHEEZING OR SHORTNESS OF BREATH Patient not taking: Reported on 08/19/2022 07/07/21   Glenford Bayley,  NP  buPROPion [facility] SR) 200 MG 12 hr tablet Take 1 tablet (200 mg total) by mouth daily. Patient not taking: Reported on 08/19/2022 08/14/21   Quillian Quince D, MD  fluticasone-salmeterol (ADVAIR DISKUS) 250-50 MCG/ACT AEPB Inhale 1 puff into the lungs in the morning and at bedtime. Patient not taking: Reported on 08/19/2022 10/14/21   Tomma Lightning, MD  predniSONE (DELTASONE) 20 MG tablet Take 2 tablets (40 mg total) by mouth daily. Take 40 mg by mouth daily for 3 days, then 20mg  by mouth daily for 3 days, then 10mg  daily for 3 days Patient not taking: Reported on 08/19/2022 03/01/22   Antony Madura, PA-C  triamcinolone (NASACORT) 55 MCG/ACT AERO nasal inhaler Place 2 sprays into the nose daily. Patient not taking: Reported on 08/19/2022 10/24/20   Dohmeier, Porfirio Mylar, MD  Vitamin D, Ergocalciferol, (DRISDOL) 1.25 MG (50000 UNIT) CAPS capsule Take 1 capsule (50,000 Units total) by mouth every 7 (seven) days. Patient not taking: Reported on 08/19/2022 08/14/21   Wilder Glade, MD      Allergies    Patient has no known allergies.    Review of Systems   Review of Systems  Respiratory:  Positive for shortness of breath.     Physical Exam Updated Vital Signs BP 129/83   Pulse 73   Temp 97.7 F (36.5 C) (Oral)   Resp 20   Ht 5\' 11"  (1.803 m)   Wt (!) 195.5 kg   SpO2 95%  BMI 60.11 kg/m  Physical Exam Vitals and nursing note reviewed.  Constitutional:      General: He is not in acute distress.    Appearance: He is obese.     Comments: Pleasant 32 year old male morbidly obese able answer questions appropriately follow commands  HENT:     Head: Normocephalic and atraumatic.     Nose: Nose normal.     Mouth/Throat:     Mouth: Mucous membranes are moist.  Eyes:     General: No scleral icterus. Cardiovascular:     Rate and Rhythm: Normal rate and regular rhythm.     Pulses: Normal pulses.     Heart sounds: Normal heart sounds.  Pulmonary:     Effort: Pulmonary effort is  normal. No respiratory distress.     Breath sounds: Normal breath sounds. No wheezing.  Abdominal:     Palpations: Abdomen is soft.     Tenderness: There is no abdominal tenderness. There is no guarding or rebound.  Musculoskeletal:     Cervical back: Normal range of motion.     Right lower leg: No edema.     Left lower leg: No edema.     Comments: Marginally asymmetric lower extremities left greater than right  Skin:    General: Skin is warm and dry.     Capillary Refill: Capillary refill takes less than 2 seconds.  Neurological:     Mental Status: He is alert. Mental status is at baseline.  Psychiatric:        Mood and Affect: Mood normal.        Behavior: Behavior normal.     ED Results / Procedures / Treatments   Labs (all labs ordered are listed, but only abnormal results are displayed) Labs Reviewed  CBC - Abnormal; Notable for the following components:      Result Value   Hemoglobin 12.8 (*)    All other components within normal limits  COMPREHENSIVE METABOLIC PANEL - Abnormal; Notable for the following components:   Potassium 3.4 (*)    Glucose, Bld 103 (*)    Calcium 8.7 (*)    All other components within normal limits  D-DIMER, QUANTITATIVE  TROPONIN I (HIGH SENSITIVITY)    EKG EKG Interpretation  Date/Time:  Wednesday Aug 19 2022 15:39:52 EDT Ventricular Rate:  72 PR Interval:  154 QRS Duration: 113 QT Interval:  408 QTC Calculation: 447 R Axis:   -17 Text Interpretation: Sinus rhythm LAD, consider left anterior fascicular block Confirmed by Vivi Barrack [phone]) on 08/19/2022 5:23:56 PM  Radiology US Venous Img Lower  Left (DVT Study)  Result Date: 08/19/2022 CLINICAL DATA:  Left leg swelling. EXAM: LEFT LOWER EXTREMITY VENOUS DOPPLER ULTRASOUND TECHNIQUE: Gray-scale sonography with compression, as well as color and duplex ultrasound, were performed to evaluate the deep venous system(s) from the level of the common femoral vein through the popliteal and  proximal calf veins. COMPARISON:  None Available. FINDINGS: VENOUS Normal compressibility of the common femoral, superficial femoral, and popliteal veins, as well as the visualized calf veins. Visualized portions of profunda femoral vein and great saphenous vein unremarkable. No filling defects to suggest DVT on grayscale or color Doppler imaging. Doppler waveforms show normal direction of venous flow, normal respiratory plasticity and response to augmentation. Limited views of the contralateral common femoral vein are unremarkable. OTHER None. Limitations: none IMPRESSION: Negative. Electronically Signed   By: Larose Hires D.O.   On: 08/19/2022 16:57   DG Chest 2 View  Result Date:  08/19/2022 CLINICAL DATA:  Chest pain EXAM: CHEST - 2 VIEW COMPARISON:  07/02/2021 FINDINGS: Multiple wires and leads project over the chest on the frontal radiograph. Midline trachea. Normal heart size and mediastinal contours. No pleural effusion or pneumothorax. Lung volumes are relatively low on the frontal. This results in prominence of the pulmonary vasculature and hilar structures. No congestive failure or lobar consolidation. IMPRESSION: Mildly low lung volumes, without acute findings. Electronically Signed   By: Jeronimo Greaves M.D.   On: 08/19/2022 16:09    Procedures Procedures    Medications Ordered in ED Medications  ipratropium-albuterol (DUONEB) 0.5-2.5 (3) MG/3ML nebulizer solution 3 mL (3 mLs Nebulization Given 08/19/22 1609)  acetaminophen (TYLENOL) tablet 1,000 mg (1,000 mg Oral Given 08/19/22 1734)    ED Course/ Medical Decision Making/ A&P Clinical Course as of 08/21/22 0429  Wed Aug 19, 2022  1548 LLE swelling new -- two days ago  [WF]  1549 8am today - sneezed and then began having CP/R sided went outside and became very short of breath (attributes this to asthma).  Pleuritic, sharp R upper chest [WF]    Clinical Course User Index [WF] Gailen Shelter, PA                             Medical  Decision Making Amount and/or Complexity of Data Reviewed Labs: ordered. Radiology: ordered.  Risk OTC drugs. Prescription drug management.   This patient presents to the ED for concern of SOB, this involves a number of treatment options, and is a complaint that carries with it a high risk of complications and morbidity. A differential diagnosis was considered for the patient's symptoms which is discussed below:   The causes for shortness of breath include but are not limited to Cardiac (AHF, pericardial effusion and tamponade, arrhythmias, ischemia, etc) Respiratory (COPD, asthma, pneumonia, pneumothorax, primary pulmonary hypertension, PE/VQ mismatch) Hematological (anemia) Neuromuscular (ALS, Guillain-Barr, etc)  Restrictive airway disease    Co morbidities: Discussed in HPI   Brief History:  Patient is a 32 year old male with a past medical history significant for obesity hypoventilation syndrome, hyperglycemia, shortness of breath on exertion, obesity, OSA on CPAP, depression, asthma, vitamin D deficiency  Patient states that around 8 AM this morning he sneezed and then immediately after experienced sharp right-sided chest pain and became very short of breath.  He states the pain was sharp and radiating through his right chest and into his right shoulder no nausea or vomiting.  He denies any dyspnea currently.  He states his symptoms have significantly improved.  He also states that he has had some left lower extremity swelling over the past 2 days no injury to his left leg.  It seems to be around the area of the calf.  He states he feels somewhat more short of breath than usual.  He has been seen by pulmonologist and was told that he had mild asthma and it appears from his notes that he was also thought to have a degree of hypoventilation syndrome related to his obesity.  No recent surgeries, hospitalization, long travel, hemoptysis, estrogen containing OCP, cancer history.  No  history of PE or VTE.       EMR reviewed including pt PMHx, past surgical history and past visits to ER.   See HPI for more details   Lab Tests:   I ordered and independently interpreted labs. Labs notable for CMP and CBC unremarkable D-dimer within normal limits  troponin within normal  Imaging Studies:  NAD. I personally reviewed all imaging studies and no acute abnormality found. I agree with radiology interpretation. No acute abnormal findings.  Notably low lung volumes    Cardiac Monitoring:  The patient was maintained on a cardiac monitor.  I personally viewed and interpreted the cardiac monitored which showed an underlying rhythm of: NSR EKG non-ischemic   Medicines ordered:  I ordered medication including Tylenol, DuoNeb for breathing Reevaluation of the patient after these medicines showed that the patient improved I have reviewed the patients home medicines and have made adjustments as needed   Critical Interventions:     Consults/Attending Physician      Reevaluation:  After the interventions noted above I re-evaluated patient and found that they have :improved   Social Determinants of Health:      Problem List / ED Course:  Patient initially feeling quite short of breath after sneezing and spearing seeing her right shoulder pain.  His symptoms are very consistent with muscular injury.  Given his obesity and tachypnea I did consider PE.  D-dimer was negative and by Wells criteria patient received 0 points as DVT has been ruled out.  I have a low suspicion for PE.  Will hold off on CT PE study.  I did not appreciate any wheezing on his exam however he feels that he has improved breathing after the DuoNeb.  I do wonder if perhaps the Tylenol I gave him did improve his muscular pain which in turn improved his breathing however this is conjecture.  He has a reassuring workup no pneumonia is pneumothorax these are abnormal findings.  Overall reassuring  workup and he is tolerating p.o. ambulatory and not hypoxic with no tachypnea.  Will discharge home we discussed follow-up with pulmonology and continue weight loss work.   Dispostion:  After consideration of the diagnostic results and the patients response to treatment, I feel that the patent would benefit from outpatient follow-up  Final Clinical Impression(s) / ED Diagnoses Final diagnoses:  SOB (shortness of breath)    Rx / DC Orders ED Discharge Orders          Ordered    albuterol (VENTOLIN HFA) 108 (90 Base) MCG/ACT inhaler  Every 4 hours PRN        08/19/22 1813              Gailen Shelter, [place] 08/21/22 0433    Loetta Rough, MD 08/21/22 1737

## 2022-08-19 NOTE — ED Notes (Signed)
RN advised patient a difficult IV stick. Patient also is extremely anxious about IV needles. Will attempt USGIV after [name] tech finished DVT study.

## 2022-08-19 NOTE — ED Triage Notes (Signed)
Pt reports right shoulder and anterior chest pain after sneezing. He went outside to get fresh air and began to feel short of breath. Hx of asthma and states this feels similar.

## 2022-10-21 ENCOUNTER — Encounter: Payer: Self-pay | Admitting: Podiatry

## 2022-10-21 ENCOUNTER — Ambulatory Visit (INDEPENDENT_AMBULATORY_CARE_PROVIDER_SITE_OTHER): Payer: 59 | Admitting: Podiatry

## 2022-10-21 VITALS — BP 157/69 | HR 77

## 2022-10-21 DIAGNOSIS — M778 Other enthesopathies, not elsewhere classified: Secondary | ICD-10-CM

## 2022-10-21 DIAGNOSIS — G8929 Other chronic pain: Secondary | ICD-10-CM | POA: Diagnosis not present

## 2022-10-21 DIAGNOSIS — M722 Plantar fascial fibromatosis: Secondary | ICD-10-CM | POA: Diagnosis not present

## 2022-10-21 DIAGNOSIS — M79671 Pain in right foot: Secondary | ICD-10-CM | POA: Diagnosis not present

## 2022-10-21 MED ORDER — METHYLPREDNISOLONE 4 MG PO TBPK
ORAL_TABLET | ORAL | 0 refills | Status: DC
Start: 2022-10-21 — End: 2024-01-21

## 2022-10-21 MED ORDER — MELOXICAM 15 MG PO TABS: 15.0000 mg | ORAL_TABLET | Freq: Every day | ORAL | 1 refills | Status: DC

## 2022-10-21 MED ORDER — METHYLPREDNISOLONE 4 MG PO TBPK: ORAL_TABLET | ORAL | 0 refills | Status: DC

## 2022-10-21 NOTE — Progress Notes (Signed)
   No chief complaint on file.   HPI: 32 y.o. male presenting today for recurrence of pain and tenderness associated to the bilateral feet.  Patient continues to have pain and tenderness on a daily basis.  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  Denies any history of injury.  This has been a chronic ongoing problem.  Past Medical History:  Diagnosis Date   ADD (attention deficit disorder)    ADHD    Asthma    Back pain    Joint pain    Obesity    Sleep apnea    SOB (shortness of breath)    Swelling of lower extremity     Past Surgical History:  Procedure Laterality Date   ADENOIDECTOMY     TONSILLECTOMY      No Known Allergies   Physical Exam: General: The patient is alert and oriented x3 in no acute distress.  Dermatology: Skin is warm, dry and supple bilateral lower extremities.   Vascular: Palpable pedal pulses bilaterally. Capillary refill within normal limits.  No appreciable edema.  No erythema.  Neurological: Grossly intact via light touch  Musculoskeletal Exam: No pedal deformities noted.  Pain throughout palpation of the bilateral foot and ankles.   Assessment/Plan of Care: 1.  Chronic bilateral foot and ankle pain 2.  Morbid obesity    -Patient evaluated -I do believe the best long-term solution for the patient would be custom molded orthotics to support the medial longitudinal arch of the foot and evenly distribute pressure throughout the foot.  Patient works on his feet the majority of the day, in combination with his weight, he is having chronic intermittent bilateral foot pain -Appointment with orthotics department for custom molded orthotics.  Order placed -Prescription for Medrol Dosepak -Prescription for meloxicam 15 mg daily -Continue wearing good supportive tennis shoes and sneakers.  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 Felecia Shelling, DPM    [address] [place], [place] [address]                Office [phone]  Fax [phone]

## 2022-10-21 NOTE — Addendum Note (Signed)
Addended by: Felecia Shelling on: 10/21/2022 01:23 PM   Modules accepted: Orders

## 2023-01-19 ENCOUNTER — Emergency Department (HOSPITAL_BASED_OUTPATIENT_CLINIC_OR_DEPARTMENT_OTHER)
Admission: EM | Admit: 2023-01-19 | Discharge: 2023-01-19 | Disposition: A | Discharge status: Home / Self Care | Payer: 59 | Attending: Emergency Medicine | Admitting: Emergency Medicine

## 2023-01-19 ENCOUNTER — Encounter (HOSPITAL_BASED_OUTPATIENT_CLINIC_OR_DEPARTMENT_OTHER): Payer: Self-pay

## 2023-01-19 ENCOUNTER — Other Ambulatory Visit: Payer: Self-pay

## 2023-01-19 ENCOUNTER — Emergency Department (HOSPITAL_BASED_OUTPATIENT_CLINIC_OR_DEPARTMENT_OTHER): Payer: 59

## 2023-01-19 DIAGNOSIS — S46211A Strain of muscle, fascia and tendon of other parts of biceps, right arm, initial encounter: Secondary | ICD-10-CM | POA: Insufficient documentation

## 2023-01-19 DIAGNOSIS — X501XXA Overexertion from prolonged static or awkward postures, initial encounter: Secondary | ICD-10-CM | POA: Insufficient documentation

## 2023-01-19 DIAGNOSIS — S4991XA Unspecified injury of right shoulder and upper arm, initial encounter: Secondary | ICD-10-CM | POA: Diagnosis present

## 2023-01-19 MED ORDER — KETOROLAC TROMETHAMINE 60 MG/2ML IM SOLN
30.0000 mg | Freq: Once | INTRAMUSCULAR | Status: DC
  Filled 2023-01-19: qty 2

## 2023-01-19 MED ORDER — ACETAMINOPHEN 500 MG PO TABS
1000.0000 mg | ORAL_TABLET | Freq: Once | ORAL | Status: AC
  Administered 2023-01-19: 1000 mg via ORAL
  Filled 2023-01-19: qty 2

## 2023-01-19 MED ORDER — DICLOFENAC SODIUM ER 100 MG PO TB24: 100.0000 mg | ORAL_TABLET | Freq: Every day | ORAL | 0 refills | Status: DC

## 2023-01-19 NOTE — ED Provider Notes (Signed)
[place] Provider Note   CSN: [id number] Arrival date & time: 01/19/23  0107     History  Chief Complaint  Patient presents with   Arm Injury    Robert Stokes is a 32 y.o. male.  The history is provided by the patient.  Arm Injury Upper extremity pain location: pop in right bicep Right. Pain details:    Radiates to:  Does not radiate   Severity:  Severe   Onset quality:  Sudden   Timing:  Constant   Progression:  Unchanged Prior injury to area:  No Worsened by:  Nothing Associated symptoms: no back pain and no fever   Risk factors: no concern for non-accidental trauma   Grabbing a water rack at work overhead and hear a pop and had instant pain in right upper arm.       Home Medications Prior to Admission medications   Medication Sig Start Date End Date Taking? Authorizing Provider  Diclofenac Sodium CR 100 MG 24 hr tablet Take 1 tablet (100 mg total) by mouth daily. 01/19/23  Yes Inaya Gillham, MD  albuterol (VENTOLIN HFA) 108 (90 Base) MCG/ACT inhaler INHALE 2 PUFFS INTO THE LUNGS EVERY 6 HOURS AS NEEDED FOR WHEEZING OR SHORTNESS OF BREATH Patient not taking: Reported on 08/19/2022 07/07/21   Glenford Bayley, NP  albuterol (VENTOLIN HFA) 108 (90 Base) MCG/ACT inhaler Inhale 2 puffs into the lungs every 4 (four) hours as needed for wheezing or shortness of breath. 08/19/22   Blanchie Dessert, Rodrigo Ran, PA  buPROPion (WELLBUTRIN SR) 200 MG 12 hr tablet Take 1 tablet (200 mg total) by mouth daily. Patient not taking: Reported on 08/19/2022 08/14/21   Quillian Quince D, MD  fluticasone-salmeterol (ADVAIR DISKUS) 250-50 MCG/ACT AEPB Inhale 1 puff into the lungs in the morning and at bedtime. Patient not taking: Reported on 08/19/2022 10/14/21   Tomma Lightning, MD  meloxicam (MOBIC) 15 MG tablet Take 1 tablet (15 mg total) by mouth daily. 10/21/22 02/18/23  Felecia Shelling, DPM  methylPREDNISolone (MEDROL DOSEPAK) 4 MG TBPK tablet 6 day  dose pack - take as directed 10/21/22   Felecia Shelling, DPM  predniSONE (DELTASONE) 20 MG tablet Take 2 tablets (40 mg total) by mouth daily. Take 40 mg by mouth daily for 3 days, then 20mg  by mouth daily for 3 days, then 10mg  daily for 3 days Patient not taking: Reported on 08/19/2022 03/01/22   Antony Madura, PA-C  triamcinolone (NASACORT) 55 MCG/ACT AERO nasal inhaler Place 2 sprays into the nose daily. Patient not taking: Reported on 08/19/2022 10/24/20   Dohmeier, Porfirio Mylar, MD  Vitamin D, Ergocalciferol, (DRISDOL) 1.25 MG (50000 UNIT) CAPS capsule Take 1 capsule (50,000 Units total) by mouth every 7 (seven) days. Patient not taking: Reported on 08/19/2022 08/14/21   Wilder Glade, MD      Allergies    Patient has no known allergies.    Review of Systems   Review of Systems  Constitutional:  Negative for fever.  Respiratory:  Negative for wheezing and stridor.   Musculoskeletal:  Positive for arthralgias. Negative for back pain.  All other systems reviewed and are negative.   Physical Exam Updated Vital Signs BP (!) 161/76 (BP Location: Right Arm)   Pulse 84   Temp 98.7 F (37.1 C) (Oral)   Resp 18   Ht 5\' 11"  (1.803 m)   Wt (!) 184.6 kg   SpO2 98%   BMI 56.76 kg/m  Physical Exam Vitals and nursing note reviewed.  Constitutional:      General: He is not in acute distress.    Appearance: Normal appearance. He is well-developed. He is not diaphoretic.  HENT:     Head: Normocephalic and atraumatic.     Nose: Nose normal.  Eyes:     Conjunctiva/sclera: Conjunctivae normal.     Pupils: Pupils are equal, round, and reactive to light.  Cardiovascular:     Rate and Rhythm: Normal rate and regular rhythm.     Pulses: Normal pulses.     Heart sounds: Normal heart sounds.  Pulmonary:     Effort: Pulmonary effort is normal.     Breath sounds: Normal breath sounds. No wheezing or rales.  Abdominal:     General: Bowel sounds are normal.     Palpations: Abdomen is soft.      Tenderness: There is no abdominal tenderness. There is no guarding or rebound.  Musculoskeletal:        General: Normal range of motion.       Arms:     Cervical back: Normal range of motion and neck supple.  Skin:    General: Skin is warm and dry.     Capillary Refill: Capillary refill takes less than 2 seconds.  Neurological:     General: No focal deficit present.     Mental Status: He is alert and oriented to person, place, and time.  Psychiatric:        Mood and Affect: Mood normal.     ED Results / Procedures / Treatments   Labs (all labs ordered are listed, but only abnormal results are displayed) Labs Reviewed - No data to display  EKG None  Radiology DG Humerus Right  Result Date: 01/19/2023 CLINICAL DATA:  Heavy lifting with popping sensation, initial encounter EXAM: RIGHT HUMERUS - 2+ VIEW COMPARISON:  None Available. FINDINGS: No acute fracture or dislocation is noted. Mild soft tissue prominence is noted in the region of the biceps which may be indicative of biceps tendon injury. No other focal abnormality is seen. IMPRESSION: Findings suspicious for biceps tendon injury. Correlate with physical exam. Electronically Signed   By: Alcide Clever M.D.   On: 01/19/2023 02:00    Procedures Procedures    Medications Ordered in ED Medications  ketorolac (TORADOL) injection 30 mg (30 mg Intramuscular Patient Refused/Not Given 01/19/23 0403)  acetaminophen (TYLENOL) tablet 1,000 mg (1,000 mg Oral Given 01/19/23 0403)    ED Course/ Medical Decision Making/ A&P                                 Medical Decision Making Amount and/or Complexity of Data Reviewed External Data Reviewed: notes. Radiology: ordered.    Details: No fracture by me  Discussion of management or test interpretation with external provider(s): Case d/w Bethany on call for orthopedic surgery at [facility].  Call in am for follow up sling for comfort.    Risk OTC drugs. Prescription drug  management. Risk Details: Ice, sling and pain medication.  Call in am to be seen in follow up with orthopedics.  Stable for discharge.      Final Clinical Impression(s) / ED Diagnoses Final diagnoses:  Biceps tendon rupture, right, initial encounter  Return for intractable cough, coughing up blood, fevers > 100.4 unrelieved by medication, shortness of breath, intractable vomiting, chest pain, shortness of breath, weakness, numbness, changes in speech, facial  asymmetry, abdominal pain, passing out, Inability to tolerate liquids or food, cough, altered mental status or any concerns. No signs of systemic illness or infection. The patient is nontoxic-appearing on exam and vital signs are within normal limits.  I have reviewed the triage vital signs and the nursing notes. Pertinent labs & imaging results that were available during my care of the patient were reviewed by me and considered in my medical decision making (see chart for details). After history, exam, and medical workup I feel the patient has been appropriately medically screened and is safe for discharge home. Pertinent diagnoses were discussed with the patient. Patient was given return precautions  Rx / DC Orders ED Discharge Orders          Ordered    Diclofenac Sodium CR 100 MG 24 hr tablet  Daily        01/19/23 0429              Taiyo Kozma, MD 01/19/23 [phone]

## 2023-01-19 NOTE — ED Triage Notes (Addendum)
Was grabbing a water rack above his head at work and he heard a pop in his bicep. Hurts to the lateral side of elbow to mid upper arm.  Is unable to move it much.

## 2023-04-22 ENCOUNTER — Other Ambulatory Visit: Payer: Self-pay | Admitting: Podiatry

## 2023-05-23 ENCOUNTER — Other Ambulatory Visit: Payer: Self-pay

## 2023-05-23 ENCOUNTER — Emergency Department (HOSPITAL_BASED_OUTPATIENT_CLINIC_OR_DEPARTMENT_OTHER): Payer: 59

## 2023-05-23 ENCOUNTER — Emergency Department (HOSPITAL_BASED_OUTPATIENT_CLINIC_OR_DEPARTMENT_OTHER)
Admission: EM | Admit: 2023-05-23 | Discharge: 2023-05-24 | Disposition: A | Discharge status: Home / Self Care | Payer: 59 | Attending: Emergency Medicine | Admitting: Emergency Medicine

## 2023-05-23 ENCOUNTER — Encounter (HOSPITAL_BASED_OUTPATIENT_CLINIC_OR_DEPARTMENT_OTHER): Payer: Self-pay | Admitting: Emergency Medicine

## 2023-05-23 DIAGNOSIS — Z79899 Other long term (current) drug therapy: Secondary | ICD-10-CM | POA: Insufficient documentation

## 2023-05-23 DIAGNOSIS — R112 Nausea with vomiting, unspecified: Secondary | ICD-10-CM

## 2023-05-23 DIAGNOSIS — R0602 Shortness of breath: Secondary | ICD-10-CM | POA: Insufficient documentation

## 2023-05-23 DIAGNOSIS — R1013 Epigastric pain: Secondary | ICD-10-CM | POA: Diagnosis not present

## 2023-05-23 DIAGNOSIS — R0789 Other chest pain: Secondary | ICD-10-CM | POA: Diagnosis present

## 2023-05-23 DIAGNOSIS — E876 Hypokalemia: Secondary | ICD-10-CM | POA: Insufficient documentation

## 2023-05-23 DIAGNOSIS — R079 Chest pain, unspecified: Secondary | ICD-10-CM

## 2023-05-23 LAB — LIPASE, BLOOD: Lipase: 23 U/L (ref 11–51)

## 2023-05-23 LAB — COMPREHENSIVE METABOLIC PANEL
ALT: 30 U/L (ref 0–44)
AST: 29 U/L (ref 15–41)
Albumin: 4.7 g/dL (ref 3.5–5.0)
Alkaline Phosphatase: 89 U/L (ref 38–126)
Anion gap: 14 (ref 5–15)
BUN: 18 mg/dL (ref 6–20)
CO2: 23 mmol/L (ref 22–32)
Calcium: 9.5 mg/dL (ref 8.9–10.3)
Chloride: 98 mmol/L (ref 98–111)
Creatinine, Ser: 0.94 mg/dL (ref 0.61–1.24)
GFR, Estimated: >60 mL/min (ref >60)
Glucose, Bld: 136 mg/dL — ABNORMAL HIGH (ref 70–99)
Potassium: 3 mmol/L — ABNORMAL LOW (ref 3.5–5.1)
Sodium: 135 mmol/L (ref 135–145)
Total Bilirubin: 1.4 mg/dL — ABNORMAL HIGH (ref 0.0–1.2)
Total Protein: 8.9 g/dL — ABNORMAL HIGH (ref 6.5–8.1)

## 2023-05-23 LAB — CBC
HCT: 45.6 % (ref 39.0–52.0)
Hemoglobin: 14.6 g/dL (ref 13.0–17.0)
MCH: 26.9 pg (ref 26.0–34.0)
MCHC: 32 g/dL (ref 30.0–36.0)
MCV: 84.1 fL (ref 80.0–100.0)
Platelets: 281 10*3/uL (ref 150–400)
RBC: 5.42 MIL/uL (ref 4.22–5.81)
RDW: 14.2 % (ref 11.5–15.5)
WBC: 9.5 10*3/uL (ref 4.0–10.5)
nRBC: 0 % (ref 0.0–0.2)

## 2023-05-23 LAB — CBG MONITORING, ED: Glucose-Capillary: 177 mg/dL — ABNORMAL HIGH (ref 70–99)

## 2023-05-23 LAB — D-DIMER, QUANTITATIVE: D-Dimer, Quant: <0.27 ug{FEU}/mL (ref 0.00–0.50)

## 2023-05-23 LAB — TROPONIN I (HIGH SENSITIVITY)
Troponin I (High Sensitivity): 6 ng/L (ref <18)
Troponin I (High Sensitivity): 5 ng/L (ref <18)

## 2023-05-23 MED ORDER — MORPHINE SULFATE (PF) 4 MG/ML IV SOLN
4.0000 mg | Freq: Once | INTRAVENOUS | Status: AC
  Administered 2023-05-23: 4 mg via INTRAVENOUS
  Filled 2023-05-23: qty 1

## 2023-05-23 MED ORDER — PANTOPRAZOLE SODIUM 40 MG PO TBEC: 40.0000 mg | DELAYED_RELEASE_TABLET | Freq: Every day | ORAL | 0 refills | Status: AC

## 2023-05-23 MED ORDER — POTASSIUM CHLORIDE CRYS ER 20 MEQ PO TBCR
40.0000 meq | EXTENDED_RELEASE_TABLET | Freq: Once | ORAL | Status: AC
  Administered 2023-05-23: 40 meq via ORAL
  Filled 2023-05-23: qty 2

## 2023-05-23 MED ORDER — ONDANSETRON HCL 4 MG/2ML IJ SOLN
4.0000 mg | Freq: Once | INTRAMUSCULAR | Status: AC
  Administered 2023-05-23: 4 mg via INTRAVENOUS
  Filled 2023-05-23: qty 2

## 2023-05-23 MED ORDER — PANTOPRAZOLE SODIUM 40 MG IV SOLR
40.0000 mg | Freq: Once | INTRAVENOUS | Status: AC
  Administered 2023-05-23: 40 mg via INTRAVENOUS
  Filled 2023-05-23: qty 10

## 2023-05-23 MED ORDER — ASPIRIN 81 MG PO CHEW: 324.0000 mg | CHEWABLE_TABLET | Freq: Once | ORAL | Status: DC

## 2023-05-23 MED ORDER — ASPIRIN 81 MG PO CHEW
324.0000 mg | CHEWABLE_TABLET | Freq: Once | ORAL | Status: AC
  Administered 2023-05-23: 324 mg via ORAL
  Filled 2023-05-23: qty 4

## 2023-05-23 MED ORDER — IOHEXOL 300 MG/ML  SOLN
125.0000 mL | Freq: Once | INTRAMUSCULAR | Status: AC | PRN
Start: 2023-05-23 — End: 2023-05-23
  Administered 2023-05-23: 125 mL via INTRAVENOUS

## 2023-05-23 NOTE — ED Notes (Signed)
 ED Provider at bedside.

## 2023-05-23 NOTE — ED Triage Notes (Signed)
 Pt from home, reports he started to have substernal chest around 11:30.  Pt when home but the pain did not decrease but got worse.  Pt is having trouble verbalizing pain.  Pt is pale, diaphoretic and clenching his chest.

## 2023-05-23 NOTE — ED Provider Notes (Signed)
 [name] Provider Note   CSN: [id number] Arrival date & time: 05/23/23  2021     History {Add pertinent medical, surgical, social history, OB history to HPI:1} Chief Complaint  Patient presents with   Chest Pain    Robert Stokes is a 33 y.o. male.   Chest Pain    Patient has a history of asthma obesity sleep apnea ADHD and panic attacks who presents to the ED for evaluation of chest discomfort.  Patient states he has been having symptoms of pain in his subxiphoid region throughout most of the day.  It has been aching it has been sharp it has been burning it has been pressure.  Patient states about an hour ago the symptoms became more severe.  Patient states the pain will become more intense and cramping.  He is feeling short of breath with it.  He has not had any leg swelling.  No fevers.  Home Medications Prior to Admission medications   Medication Sig Start Date End Date Taking? Authorizing Provider  pantoprazole (PROTONIX) 40 MG tablet Take 1 tablet (40 mg total) by mouth daily. 05/23/23  Yes Linwood Dibbles, MD  albuterol (VENTOLIN HFA) 108 (90 Base) MCG/ACT inhaler INHALE 2 PUFFS INTO THE LUNGS EVERY 6 HOURS AS NEEDED FOR WHEEZING OR SHORTNESS OF BREATH Patient not taking: Reported on 08/19/2022 07/07/21   Glenford Bayley, NP  albuterol (VENTOLIN HFA) 108 (90 Base) MCG/ACT inhaler Inhale 2 puffs into the lungs every 4 (four) hours as needed for wheezing or shortness of breath. 08/19/22   Blanchie Dessert, Rodrigo Ran, PA  buPROPion (WELLBUTRIN SR) 200 MG 12 hr tablet Take 1 tablet (200 mg total) by mouth daily. Patient not taking: Reported on 08/19/2022 08/14/21   Quillian Quince D, MD  Diclofenac Sodium CR 100 MG 24 hr tablet Take 1 tablet (100 mg total) by mouth daily. 01/19/23   Palumbo, April, MD  fluticasone-salmeterol (ADVAIR DISKUS) 250-50 MCG/ACT AEPB Inhale 1 puff into the lungs in the morning and at bedtime. Patient not taking: Reported on  08/19/2022 10/14/21   Virl Diamond A, MD  meloxicam (MOBIC) 15 MG tablet TAKE 1 TABLET (15 MG TOTAL) BY MOUTH DAILY. 04/22/23 08/20/23  Felecia Shelling, DPM  methylPREDNISolone (MEDROL DOSEPAK) 4 MG TBPK tablet 6 day dose pack - take as directed 10/21/22   Felecia Shelling, DPM  predniSONE (DELTASONE) 20 MG tablet Take 2 tablets (40 mg total) by mouth daily. Take 40 mg by mouth daily for 3 days, then 20mg  by mouth daily for 3 days, then 10mg  daily for 3 days Patient not taking: Reported on 08/19/2022 03/01/22   Antony Madura, PA-C  triamcinolone (NASACORT) 55 MCG/ACT AERO nasal inhaler Place 2 sprays into the nose daily. Patient not taking: Reported on 08/19/2022 10/24/20   Dohmeier, Porfirio Mylar, MD  Vitamin D, Ergocalciferol, (DRISDOL) 1.25 MG (50000 UNIT) CAPS capsule Take 1 capsule (50,000 Units total) by mouth every 7 (seven) days. Patient not taking: Reported on 08/19/2022 08/14/21   Wilder Glade, MD      Allergies    Patient has no known allergies.    Review of Systems   Review of Systems  Cardiovascular:  Positive for chest pain.    Physical Exam Updated Vital Signs BP 118/65   Pulse 67   Temp 98.1 F (36.7 C) (Oral)   Resp 19   Ht 1.803 m (5\' 11" )   Wt (!) 184.6 kg   SpO2 98%  BMI 56.76 kg/m  Physical Exam Vitals and nursing note reviewed.  Constitutional:      Appearance: He is well-developed. He is obese. He is diaphoretic. He is not ill-appearing.  HENT:     Head: Normocephalic and atraumatic.     Right Ear: External ear normal.     Left Ear: External ear normal.  Eyes:     General: No scleral icterus.       Right eye: No discharge.        Left eye: No discharge.     Conjunctiva/sclera: Conjunctivae normal.  Neck:     Trachea: No tracheal deviation.  Cardiovascular:     Rate and Rhythm: Normal rate and regular rhythm.  Pulmonary:     Effort: Pulmonary effort is normal. No respiratory distress.     Breath sounds: Normal breath sounds. No stridor. No wheezing or rales.   Abdominal:     General: Bowel sounds are normal. There is no distension.     Palpations: Abdomen is soft.     Tenderness: There is abdominal tenderness in the epigastric area. There is no guarding or rebound.  Musculoskeletal:        General: No tenderness or deformity.     Cervical back: Neck supple.  Skin:    General: Skin is warm.     Findings: No rash.  Neurological:     General: No focal deficit present.     Mental Status: He is alert.     Cranial Nerves: No cranial nerve deficit, dysarthria or facial asymmetry.     Sensory: No sensory deficit.     Motor: No abnormal muscle tone or seizure activity.     Coordination: Coordination normal.  Psychiatric:        Mood and Affect: Mood normal.     ED Results / Procedures / Treatments   Labs (all labs ordered are listed, but only abnormal results are displayed) Labs Reviewed  COMPREHENSIVE METABOLIC PANEL - Abnormal; Notable for the following components:      Result Value   Potassium 3.0 (*)    Glucose, Bld 136 (*)    Total Protein 8.9 (*)    Total Bilirubin 1.4 (*)    All other components within normal limits  CBG MONITORING, ED - Abnormal; Notable for the following components:   Glucose-Capillary 177 (*)    All other components within normal limits  CBC  LIPASE, BLOOD  D-DIMER, QUANTITATIVE  TROPONIN I (HIGH SENSITIVITY)  TROPONIN I (HIGH SENSITIVITY)    EKG EKG Interpretation Date/Time:  Sunday May 23 2023 20:40:36 EST Ventricular Rate:  84 PR Interval:  146 QRS Duration:  116 QT Interval:  405 QTC Calculation: 479 R Axis:   -24  Text Interpretation: Sinus rhythm LAD, consider left anterior fascicular block No significant change since last tracing Confirmed by Linwood Dibbles [phone]) on 05/23/2023 8:49:23 PM  Radiology DG Chest Portable 1 View Result Date: 05/23/2023 CLINICAL DATA:  Substernal chest pain since 11:30 a.m., diaphoresis EXAM: PORTABLE CHEST 1 VIEW COMPARISON:  08/19/2022 FINDINGS: Single  frontal view of the chest demonstrates an unremarkable cardiac silhouette. No acute airspace disease, effusion, or pneumothorax. No acute bony abnormalities. IMPRESSION: 1. No acute intrathoracic process. Electronically Signed   By: Sharlet Salina M.D.   On: 05/23/2023 21:04    Procedures Procedures  {Document cardiac monitor, telemetry assessment procedure when appropriate:1}  Medications Ordered in ED Medications  iohexol (OMNIPAQUE) 300 MG/ML solution 125 mL (has no administration in time range)  potassium chloride SA (KLOR-CON M) CR tablet 40 mEq (has no administration in time range)  aspirin chewable tablet 324 mg (324 mg Oral Given 05/23/23 2039)  pantoprazole (PROTONIX) injection 40 mg (40 mg Intravenous Given 05/23/23 2040)  morphine (PF) 4 MG/ML injection 4 mg (4 mg Intravenous Given 05/23/23 2051)  ondansetron (ZOFRAN) injection 4 mg (4 mg Intravenous Given 05/23/23 2204)    ED Course/ Medical Decision Making/ A&P Clinical Course as of 05/23/23 2319  Sun May 23, 2023  2119 Lipase normal.  Metabolic panel does show hypokalemia.  Troponin normal.  CBC normal [JK]  2119 Chest x-ray without acute findings [JK]    Clinical Course User Index [JK] Linwood Dibbles, MD   {   Click here for ABCD2, HEART and other calculatorsREFRESH Note before signing :1}                              Medical Decision Making Differential diagnosis includes but not limited to acute coronary syndrome, pneumonia, pneumothorax, pancreatitis hepatitis gastritis  Amount and/or Complexity of Data Reviewed Labs: ordered. Radiology: ordered.  Risk OTC drugs. Prescription drug management.   Patient presented to the ED with complaints of pain in his epigastric region.  Patient states he has been having episodes of feeling hot and nauseated and nauseous.  Initial EKG does not show any signs of acute ST elevation MI.  Patient's laboratory tests do show normal hemoglobin.  No leukocytosis.  Serial troponins are  normal.  No signs of hepatitis or pancreatitis.  Patient noted to be hypokalemic oral potassium ordered.  With his complaints of upper abdominal pain will CT scan to rule out any other acute pathology such as cholecystitis.  If CT scan negative plan on discharging home with prescription for antacid medications.  {Document critical care time when appropriate:1} {Document review of labs and clinical decision tools ie heart score, Chads2Vasc2 etc:1}  {Document your independent review of radiology images, and any outside records:1} {Document your discussion with family members, caretakers, and with consultants:1} {Document social determinants of health affecting pt's care:1} {Document your decision making why or why not admission, treatments were needed:1} Final Clinical Impression(s) / ED Diagnoses Final diagnoses:  None    Rx / DC Orders ED Discharge Orders          Ordered    pantoprazole (PROTONIX) 40 MG tablet  Daily        05/23/23 2319

## 2023-05-23 NOTE — ED Notes (Signed)
 Pt found thowing up in room.  Provider bedside as well.  Pt indicated that he just got real hot and nauseas.  Orders placed.  Provider instructed RN that pt could drink.  Small amount of water given.

## 2023-05-24 MED ORDER — ONDANSETRON 4 MG PO TBDP: 4.0000 mg | ORAL_TABLET | Freq: Three times a day (TID) | ORAL | 0 refills | Status: AC | PRN

## 2023-05-24 NOTE — ED Provider Notes (Signed)
 I assumed care of this patient from previous provider.  Please see their note for further details of history, exam, and MDM.   Briefly patient is a 33 y.o. male who presented left chest pain with N/V.  Cardiac workup was reassuring.  Currently awaiting CT scan.  CT scan negative for any acute intra-abdominal inflammatory/infectious process or bowel obstruction.  Patient able to tolerate p.o.   The patient appears reasonably screened and/or stabilized for discharge and I doubt any other medical condition or other [facility] requiring further screening, evaluation, or treatment in the ED at this time. I have discussed the findings, Dx and Tx plan with the patient/family who expressed understanding and agree(s) with the plan. Discharge instructions discussed at length. The patient/family was given strict return precautions who verbalized understanding of the instructions. No further questions at time of discharge.  Disposition: Discharge  Condition: Good  ED Discharge Orders          Ordered    ondansetron (ZOFRAN-ODT) 4 MG disintegrating tablet  Every 8 hours PRN        05/24/23 0047    pantoprazole (PROTONIX) 40 MG tablet  Daily        05/23/23 2319            Follow Up: Primary care provider  Call  to schedule an appointment for close follow up        Nira Conn, MD 05/24/23 [phone]

## 2023-06-30 ENCOUNTER — Other Ambulatory Visit: Payer: Self-pay | Admitting: Physician Assistant

## 2023-06-30 DIAGNOSIS — M75101 Unspecified rotator cuff tear or rupture of right shoulder, not specified as traumatic: Secondary | ICD-10-CM

## 2023-07-21 ENCOUNTER — Ambulatory Visit
Admission: RE | Admit: 2023-07-21 | Discharge: 2023-07-21 | Disposition: A | Discharge status: Home / Self Care | Source: Ambulatory Visit | Attending: Physician Assistant | Admitting: Physician Assistant

## 2023-07-21 DIAGNOSIS — M12811 Other specific arthropathies, not elsewhere classified, right shoulder: Secondary | ICD-10-CM

## 2023-11-23 ENCOUNTER — Encounter: Payer: Self-pay | Admitting: Emergency Medicine

## 2023-11-23 ENCOUNTER — Ambulatory Visit
Admission: EM | Admit: 2023-11-23 | Discharge: 2023-11-23 | Disposition: A | Discharge status: Home / Self Care | Attending: Physician Assistant | Admitting: Physician Assistant

## 2023-11-23 DIAGNOSIS — L84 Corns and callosities: Secondary | ICD-10-CM

## 2023-11-23 MED ORDER — IBUPROFEN 800 MG PO TABS: 800.0000 mg | ORAL_TABLET | Freq: Three times a day (TID) | ORAL | 0 refills | Status: DC | PRN

## 2023-11-23 NOTE — ED Triage Notes (Signed)
 Pt works at [employer] and stands for 8-12hrs. Pt c/o right foot pain near arch that feels like a ball. Pt states pain is worse when standing and moves around.

## 2023-11-23 NOTE — ED Provider Notes (Signed)
 [facility]    CSN: [id number] Arrival date & time: 11/23/23  1421      History   Chief Complaint Chief Complaint  Patient presents with   right foot pain    HPI Robert Stokes is a 33 y.o. male.   Patient complains of a sore area to his right foot.  Patient reports that he works standing for 12-hour days.  Patient states he feels like he has a knot in the sole of his foot.  Patient reports that he had a pedicure to see if this would help with the knot.  Patient states that it has gotten progressively worse.  Patient has been seen by podiatry in the past.  He has inserts in his shoe.  Patient states that he wears work orthotic shoes he has not had any injury.  The history is provided by the patient. No language interpreter was used.    Past Medical History:  Diagnosis Date   ADD (attention deficit disorder)    ADHD    Asthma    Back pain    Joint pain    Obesity    Sleep apnea    SOB (shortness of breath)    Swelling of lower extremity     Patient Active Problem List   Diagnosis Date Noted   Depression 08/14/2021   OSA on CPAP 07/07/2021   Hypoxia 07/07/2021   Comorbid sleep-related hypoventilation 05/26/2021   Obesity with alveolar hypoventilation and body mass index (BMI) of 40 or greater (HCC) 05/26/2021   Stress fracture of metatarsal bone of right foot 04/08/2021   Sprain of right foot 01/07/2021   Super obese 10/24/2020   Sleep related headaches 10/24/2020   Episodic cluster headache, not intractable 10/24/2020   Sleeps in sitting position due to orthopnea 10/24/2020   Insomnia due to medical condition 10/24/2020   Loud snoring 10/24/2020   Excessive daytime sleepiness 10/24/2020   Other fatigue 08/27/2020   Hyperglycemia 08/27/2020   SOB (shortness of breath) on exertion 08/27/2020   Vitamin D  deficiency 08/27/2020   Moderate left ankle sprain 01/13/2019    Past Surgical History:  Procedure Laterality Date   ADENOIDECTOMY      TONSILLECTOMY         Home Medications    Prior to Admission medications   Medication Sig Start Date End Date Taking? Authorizing Provider  ibuprofen  (ADVIL ) 800 MG tablet Take 1 tablet (800 mg total) by mouth every 8 (eight) hours as needed. 11/23/23  Yes Flint Sonny POUR, PA-C  albuterol  (VENTOLIN  HFA) 108 (90 Base) MCG/ACT inhaler INHALE 2 PUFFS INTO THE LUNGS EVERY 6 HOURS AS NEEDED FOR WHEEZING OR SHORTNESS OF BREATH Patient not taking: Reported on 08/19/2022 07/07/21   Hope Almarie ORN, NP  albuterol  (VENTOLIN  HFA) 108 [id number] Base) MCG/ACT inhaler Inhale 2 puffs into the lungs every 4 (four) hours as needed for wheezing or shortness of breath. 08/19/22   Neldon Hamp RAMAN, PA  buPROPion  (WELLBUTRIN  SR) 200 MG 12 hr tablet Take 1 tablet (200 mg total) by mouth daily. Patient not taking: Reported on 08/19/2022 08/14/21   Verdon Parry D, MD  Diclofenac  Sodium CR 100 MG 24 hr tablet Take 1 tablet (100 mg total) by mouth daily. 01/19/23   Palumbo, April, MD  fluticasone -salmeterol (ADVAIR DISKUS) 250-50 MCG/ACT AEPB Inhale 1 puff into the lungs in the morning and at bedtime. Patient not taking: Reported on 08/19/2022 10/14/21   Neda Jennet LABOR, MD  methylPREDNISolone  (MEDROL  DOSEPAK) 4 MG  TBPK tablet 6 day dose pack - take as directed 10/21/22   Janit Thresa HERO, DPM  pantoprazole  (PROTONIX ) 40 MG tablet Take 1 tablet (40 mg total) by mouth daily. 05/23/23   Randol Simmonds, MD  predniSONE  (DELTASONE ) 20 MG tablet Take 2 tablets (40 mg total) by mouth daily. Take 40 mg by mouth daily for 3 days, then 20mg  by mouth daily for 3 days, then 10mg  daily for 3 days Patient not taking: Reported on 08/19/2022 03/01/22   Keith Sor, PA-C  triamcinolone  (NASACORT ) 55 MCG/ACT AERO nasal inhaler Place 2 sprays into the nose daily. Patient not taking: Reported on 08/19/2022 10/24/20   Dohmeier, Dedra, MD  Vitamin D , Ergocalciferol , (DRISDOL ) 1.25 MG (50000 UNIT) CAPS capsule Take 1 capsule (50,000 Units total) by mouth  every 7 (seven) days. Patient not taking: Reported on 08/19/2022 08/14/21   Verdon Louann BIRCH, MD    Family History Family History  Problem Relation Age of Onset   Diabetes Mother    Hypertension Mother    Heart failure Mother    Cancer Mother    Depression Mother    Anxiety disorder Mother    Liver cancer Mother    Sleep apnea Mother    Alcoholism Mother    Drug abuse Mother    Cancer Father    Depression Father    Anxiety disorder Father    Sleep apnea Father    Alcoholism Father    Drug abuse Father     Social History Social History   Tobacco Use   Smoking status: Never   Smokeless tobacco: Never  Vaping Use   Vaping status: Never Used  Substance Use Topics   Alcohol use: Never   Drug use: No     Allergies   Patient has no known allergies.   Review of Systems Review of Systems  All other systems reviewed and are negative.    Physical Exam Triage Vital Signs ED Triage Vitals  Encounter Vitals Group     BP 11/23/23 1434 (!) 150/90     Girls Systolic BP Percentile --      Girls Diastolic BP Percentile --      Boys Systolic BP Percentile --      Boys Diastolic BP Percentile --      Pulse Rate 11/23/23 1434 72     Resp 11/23/23 1434 18     Temp 11/23/23 1434 97.9 F (36.6 C)     Temp Source 11/23/23 1434 Oral     SpO2 11/23/23 1434 96 %     Weight --      Height --      Head Circumference --      Peak Flow --      Pain Score 11/23/23 1436 8     Pain Loc --      Pain Education --      Exclude from Growth Chart --    No data found.  Updated Vital Signs BP (!) 150/90 (BP Location: Right Arm)   Pulse 72   Temp 97.9 F (36.6 C) (Oral)   Resp 18   SpO2 96%   Visual Acuity Right Eye Distance:   Left Eye Distance:   Bilateral Distance:    Right Eye Near:   Left Eye Near:    Bilateral Near:     Physical Exam Vitals reviewed.  Constitutional:      Appearance: Normal appearance.  Musculoskeletal:     Comments: Dime sized area of  swelling sole  of right foot, firm, no erythema no drainage, small dot in center,  Skin:    General: Skin is warm.  Neurological:     General: No focal deficit present.     Mental Status: He is alert.      UC Treatments / Results  Labs (all labs ordered are listed, but only abnormal results are displayed) Labs Reviewed - No data to display  EKG   Radiology No results found.  Procedures Procedures (including critical care time)  Medications Ordered in UC Medications - No data to display  Initial Impression / Assessment and Plan / UC Course  I have reviewed the triage vital signs and the nursing notes.  Pertinent labs & imaging results that were available during my care of the patient were reviewed by me and considered in my medical decision making (see chart for details).     Patient has a painful callus to his foot.  I advised pads for the callused area elevating his foot trying to decrease the amount of time that he is on his feet.  Patient is advised to follow-up with his podiatrist for recheck.  Patient given a prescription for ibuprofen .  He is advised to return if any problems Final Clinical Impressions(s) / UC Diagnoses   Final diagnoses:  Foot callus   Discharge Instructions   None    ED Prescriptions     Medication Sig Dispense Auth. Provider   ibuprofen  (ADVIL ) 800 MG tablet Take 1 tablet (800 mg total) by mouth every 8 (eight) hours as needed. 30 tablet Star Cheese K, PA-C      PDMP not reviewed this encounter.An After Visit Summary was printed and given to the patient.       Flint Sonny POUR, PA-C 11/23/23 [phone]

## 2024-01-21 ENCOUNTER — Emergency Department (HOSPITAL_BASED_OUTPATIENT_CLINIC_OR_DEPARTMENT_OTHER)
Admission: EM | Admit: 2024-01-21 | Discharge: 2024-01-22 | Disposition: A | Discharge status: Home / Self Care | Payer: Worker's Compensation | Attending: Emergency Medicine | Admitting: Emergency Medicine

## 2024-01-21 ENCOUNTER — Encounter (HOSPITAL_BASED_OUTPATIENT_CLINIC_OR_DEPARTMENT_OTHER): Payer: Self-pay | Admitting: Emergency Medicine

## 2024-01-21 ENCOUNTER — Other Ambulatory Visit: Payer: Self-pay

## 2024-01-21 ENCOUNTER — Emergency Department (HOSPITAL_BASED_OUTPATIENT_CLINIC_OR_DEPARTMENT_OTHER): Payer: Worker's Compensation

## 2024-01-21 DIAGNOSIS — Y990 Civilian activity done for income or pay: Secondary | ICD-10-CM | POA: Diagnosis not present

## 2024-01-21 DIAGNOSIS — W228XXA Striking against or struck by other objects, initial encounter: Secondary | ICD-10-CM | POA: Diagnosis not present

## 2024-01-21 DIAGNOSIS — S6992XA Unspecified injury of left wrist, hand and finger(s), initial encounter: Secondary | ICD-10-CM | POA: Diagnosis present

## 2024-01-21 DIAGNOSIS — S60222A Contusion of left hand, initial encounter: Secondary | ICD-10-CM | POA: Insufficient documentation

## 2024-01-21 MED ORDER — IBUPROFEN 400 MG PO TABS
600.0000 mg | ORAL_TABLET | Freq: Once | ORAL | Status: AC
  Administered 2024-01-22: 600 mg via ORAL
  Filled 2024-01-21: qty 1

## 2024-01-21 MED ORDER — IBUPROFEN 800 MG PO TABS: 800.0000 mg | ORAL_TABLET | Freq: Three times a day (TID) | ORAL | 0 refills | Status: AC | PRN

## 2024-01-21 NOTE — ED Triage Notes (Signed)
 Pt with LT hand injury; sts he was pushing a metal cart and the handle snapped, smashing his hand against the cart

## 2024-01-21 NOTE — ED Provider Notes (Signed)
   [place] [facility]  Provider Note  CSN: [id number] Arrival date & time: 01/21/24 2033  History Chief Complaint  Patient presents with   Hand Injury    Robert Stokes is a 33 y.o. male reports he was at work earlier [employer] pushing a cart of metal food cloches when the cart broke and the stack of cloches slammed into his L hand. Complaining of pain, worse with movement.    Home Medications Prior to Admission medications   Medication Sig Start Date End Date Taking? Authorizing Provider  albuterol  (VENTOLIN  HFA) 108 (90 Base) MCG/ACT inhaler Inhale 2 puffs into the lungs every 4 (four) hours as needed for wheezing or shortness of breath. 08/19/22   [place], Hamp RAMAN, PA  ibuprofen  (ADVIL ) 800 MG tablet Take 1 tablet (800 mg total) by mouth every 8 (eight) hours as needed. 01/21/24   Roselyn Carlin NOVAK, MD  pantoprazole  (PROTONIX ) 40 MG tablet Take 1 tablet (40 mg total) by mouth daily. 05/23/23   Randol Simmonds, MD     Allergies    Patient has no known allergies.   Review of Systems   Review of Systems Please see HPI for pertinent positives and negatives  Physical Exam BP (!) 156/92 (BP Location: Right Arm)   Pulse 86   Temp 98.3 F (36.8 C) (Oral)   Resp 18   Ht 5' 11 (1.803 m)   Wt 133.8 kg   SpO2 97%   BMI 41.14 kg/m   Physical Exam Vitals and nursing note reviewed.  HENT:     Head: Normocephalic.     Nose: Nose normal.  Eyes:     Extraocular Movements: Extraocular movements intact.  Pulmonary:     Effort: Pulmonary effort is normal.  Musculoskeletal:        General: Tenderness (L dorsal hand) present. No swelling or deformity. Normal range of motion.     Cervical back: Neck supple.  Skin:    Findings: No rash (on exposed skin).  Neurological:     Mental Status: He is alert and oriented to person, place, and time.  Psychiatric:        Mood and Affect: Mood normal.     ED Results / Procedures / Treatments    EKG None  Procedures Procedures  Medications Ordered in the ED Medications  ibuprofen  (ADVIL ) tablet 600 mg (has no administration in time range)    Initial Impression and Plan  Patient here for L hand injury, I personally viewed the images from radiology studies and agree with radiologist interpretation: Xray is neg. Suspect a mild contusion, will provide ACE wrap, recommend ice, elevation, NSAIDs for pain. PCP follow up, RTED for any other concerns.    ED Course       MDM Rules/Calculators/A&P Medical Decision Making Problems Addressed: Contusion of left hand, initial encounter: acute illness or injury  Amount and/or Complexity of Data Reviewed Radiology: ordered and independent interpretation performed. Decision-making details documented in ED Course.  Risk Prescription drug management.     Final Clinical Impression(s) / ED Diagnoses Final diagnoses:  Contusion of left hand, initial encounter    Rx / DC Orders ED Discharge Orders          Ordered    ibuprofen  (ADVIL ) 800 MG tablet  Every 8 hours PRN        01/21/24 2352             Roselyn Carlin NOVAK, MD 01/21/24 2352

## 2024-01-22 NOTE — ED Notes (Signed)
 PT transferred from [facility] to ED RM 4. Assuming pt care at this time.

## 2024-04-11 ENCOUNTER — Other Ambulatory Visit: Payer: Self-pay

## 2024-04-11 DIAGNOSIS — M79672 Pain in left foot: Secondary | ICD-10-CM

## 2024-04-11 DIAGNOSIS — M79671 Pain in right foot: Secondary | ICD-10-CM

## 2024-04-16 ENCOUNTER — Ambulatory Visit: Admission: RE | Admit: 2024-04-16 | Discharge: 2024-04-16 | Disposition: A | Source: Ambulatory Visit

## 2024-04-16 DIAGNOSIS — M79672 Pain in left foot: Secondary | ICD-10-CM

## 2024-04-16 DIAGNOSIS — M79671 Pain in right foot: Secondary | ICD-10-CM
